# Patient Record
Sex: Female | Born: 1935 | Race: White | Hispanic: No | Smoking: Never smoker
Health system: Southern US, Community
[De-identification: ages and names within clinical notes are randomized; demographics above are authoritative.]

## PROBLEM LIST (undated history)

## (undated) DIAGNOSIS — M1990 Unspecified osteoarthritis, unspecified site: Secondary | ICD-10-CM

## (undated) DIAGNOSIS — I10 Essential (primary) hypertension: Secondary | ICD-10-CM

## (undated) DIAGNOSIS — IMO0001 Reserved for inherently not codable concepts without codable children: Secondary | ICD-10-CM

## (undated) DIAGNOSIS — K5792 Diverticulitis of intestine, part unspecified, without perforation or abscess without bleeding: Secondary | ICD-10-CM

## (undated) DIAGNOSIS — J189 Pneumonia, unspecified organism: Secondary | ICD-10-CM

## (undated) DIAGNOSIS — I341 Nonrheumatic mitral (valve) prolapse: Secondary | ICD-10-CM

## (undated) DIAGNOSIS — R42 Dizziness and giddiness: Secondary | ICD-10-CM

## (undated) DIAGNOSIS — M810 Age-related osteoporosis without current pathological fracture: Secondary | ICD-10-CM

## (undated) DIAGNOSIS — R000 Tachycardia, unspecified: Secondary | ICD-10-CM

## (undated) DIAGNOSIS — R51 Headache: Secondary | ICD-10-CM

## (undated) DIAGNOSIS — I4891 Unspecified atrial fibrillation: Secondary | ICD-10-CM

## (undated) DIAGNOSIS — Z5189 Encounter for other specified aftercare: Secondary | ICD-10-CM

## (undated) DIAGNOSIS — S62109A Fracture of unspecified carpal bone, unspecified wrist, initial encounter for closed fracture: Secondary | ICD-10-CM

## (undated) DIAGNOSIS — Z85048 Personal history of other malignant neoplasm of rectum, rectosigmoid junction, and anus: Secondary | ICD-10-CM

## (undated) DIAGNOSIS — S72001A Fracture of unspecified part of neck of right femur, initial encounter for closed fracture: Secondary | ICD-10-CM

## (undated) DIAGNOSIS — R55 Syncope and collapse: Secondary | ICD-10-CM

## (undated) DIAGNOSIS — F419 Anxiety disorder, unspecified: Secondary | ICD-10-CM

## (undated) HISTORY — DX: Diverticulitis of intestine, part unspecified, without perforation or abscess without bleeding: K57.92

## (undated) HISTORY — DX: Personal history of other malignant neoplasm of rectum, rectosigmoid junction, and anus: Z85.048

## (undated) HISTORY — DX: Age-related osteoporosis without current pathological fracture: M81.0

## (undated) HISTORY — PX: COLECTOMY: SHX59

## (undated) HISTORY — DX: Unspecified atrial fibrillation: I48.91

## (undated) HISTORY — PX: COLONOSCOPY: SHX174

## (undated) HISTORY — PX: COLON SURGERY: SHX602

## (undated) HISTORY — PX: TOTAL HIP ARTHROPLASTY: SHX124

## (undated) HISTORY — DX: Nonrheumatic mitral (valve) prolapse: I34.1

## (undated) HISTORY — PX: BREAST CYST EXCISION: SHX579

## (undated) HISTORY — DX: Essential (primary) hypertension: I10

## (undated) HISTORY — PX: CATARACT EXTRACTION W/ INTRAOCULAR LENS  IMPLANT, BILATERAL: SHX1307

## (undated) HISTORY — DX: Fracture of unspecified part of neck of right femur, initial encounter for closed fracture: S72.001A

## (undated) HISTORY — PX: INGUINAL HERNIA REPAIR: SUR1180

## (undated) HISTORY — DX: Fracture of unspecified carpal bone, unspecified wrist, initial encounter for closed fracture: S62.109A

---

## 2005-03-20 ENCOUNTER — Ambulatory Visit (HOSPITAL_COMMUNITY): Admission: RE | Admit: 2005-03-20 | Discharge: 2005-03-20 | Payer: Self-pay | Admitting: Gastroenterology

## 2005-03-20 ENCOUNTER — Encounter (INDEPENDENT_AMBULATORY_CARE_PROVIDER_SITE_OTHER): Payer: Self-pay | Admitting: Specialist

## 2005-04-14 ENCOUNTER — Encounter: Admission: RE | Admit: 2005-04-14 | Discharge: 2005-04-14 | Payer: Self-pay | Admitting: Family Medicine

## 2005-05-06 ENCOUNTER — Ambulatory Visit (HOSPITAL_COMMUNITY): Admission: RE | Admit: 2005-05-06 | Discharge: 2005-05-06 | Payer: Self-pay | Admitting: Gastroenterology

## 2005-05-06 ENCOUNTER — Encounter (INDEPENDENT_AMBULATORY_CARE_PROVIDER_SITE_OTHER): Payer: Self-pay | Admitting: *Deleted

## 2005-08-14 ENCOUNTER — Ambulatory Visit (HOSPITAL_COMMUNITY): Admission: RE | Admit: 2005-08-14 | Discharge: 2005-08-14 | Payer: Self-pay | Admitting: Gastroenterology

## 2005-08-15 ENCOUNTER — Encounter (INDEPENDENT_AMBULATORY_CARE_PROVIDER_SITE_OTHER): Payer: Self-pay | Admitting: Specialist

## 2005-08-15 ENCOUNTER — Inpatient Hospital Stay (HOSPITAL_COMMUNITY): Admission: RE | Admit: 2005-08-15 | Discharge: 2005-08-26 | Payer: Self-pay | Admitting: General Surgery

## 2005-08-20 ENCOUNTER — Encounter: Payer: Self-pay | Admitting: Cardiology

## 2005-08-20 HISTORY — PX: TRANSTHORACIC ECHOCARDIOGRAM: SHX275

## 2006-05-26 ENCOUNTER — Encounter: Admission: RE | Admit: 2006-05-26 | Discharge: 2006-05-26 | Payer: Self-pay | Admitting: Obstetrics and Gynecology

## 2007-07-19 ENCOUNTER — Encounter: Admission: RE | Admit: 2007-07-19 | Discharge: 2007-07-19 | Payer: Self-pay | Admitting: Cardiology

## 2007-12-13 ENCOUNTER — Inpatient Hospital Stay (HOSPITAL_COMMUNITY): Admission: EM | Admit: 2007-12-13 | Discharge: 2007-12-25 | Payer: Self-pay | Admitting: Emergency Medicine

## 2007-12-13 ENCOUNTER — Ambulatory Visit: Payer: Self-pay | Admitting: Internal Medicine

## 2007-12-13 ENCOUNTER — Ambulatory Visit: Payer: Self-pay | Admitting: Pulmonary Disease

## 2008-02-11 DIAGNOSIS — S72001A Fracture of unspecified part of neck of right femur, initial encounter for closed fracture: Secondary | ICD-10-CM

## 2008-02-11 HISTORY — DX: Fracture of unspecified part of neck of right femur, initial encounter for closed fracture: S72.001A

## 2008-08-11 ENCOUNTER — Encounter: Admission: RE | Admit: 2008-08-11 | Discharge: 2008-08-11 | Payer: Self-pay | Admitting: Obstetrics and Gynecology

## 2009-10-31 ENCOUNTER — Encounter: Admission: RE | Admit: 2009-10-31 | Discharge: 2009-10-31 | Payer: Self-pay | Admitting: Obstetrics and Gynecology

## 2009-11-02 ENCOUNTER — Ambulatory Visit: Payer: Self-pay | Admitting: Cardiology

## 2010-03-03 ENCOUNTER — Encounter: Payer: Self-pay | Admitting: Family Medicine

## 2010-04-19 ENCOUNTER — Ambulatory Visit (INDEPENDENT_AMBULATORY_CARE_PROVIDER_SITE_OTHER): Payer: Federal, State, Local not specified - PPO | Admitting: Cardiology

## 2010-04-19 DIAGNOSIS — I10 Essential (primary) hypertension: Secondary | ICD-10-CM

## 2010-04-19 DIAGNOSIS — I4891 Unspecified atrial fibrillation: Secondary | ICD-10-CM

## 2010-05-03 ENCOUNTER — Other Ambulatory Visit: Payer: Self-pay | Admitting: *Deleted

## 2010-05-03 DIAGNOSIS — I10 Essential (primary) hypertension: Secondary | ICD-10-CM

## 2010-05-03 MED ORDER — VERAPAMIL HCL ER 180 MG PO TBCR: 180.0000 mg | EXTENDED_RELEASE_TABLET | Freq: Every day | ORAL | Status: DC

## 2010-05-03 NOTE — Telephone Encounter (Signed)
REFILL PER FAX FROM PHARMACY  

## 2010-06-06 ENCOUNTER — Other Ambulatory Visit: Payer: Self-pay | Admitting: Nurse Practitioner

## 2010-06-06 DIAGNOSIS — I10 Essential (primary) hypertension: Secondary | ICD-10-CM

## 2010-06-25 NOTE — Consult Note (Signed)
[name], [name]                ACCOUNT NO.:  [id number]   MEDICAL RECORD NO.:  [id number]          PATIENT TYPE:  INP   LOCATION:  0104                         FACILITY:  [facility]   PHYSICIAN:  Bevelyn Buckles. Bensimhon, MDDATE OF BIRTH:  1914-04-04   DATE OF CONSULTATION:  DATE OF DISCHARGE:                                 CONSULTATION   CARDIOLOGIST:  Dr. Roger Shelter   CONSULTING PHYSICIAN:  Dr. Homero Fellers Aluisio   REASON FOR CONSULTATION:  Preoperative evaluation in the setting of  right hip fracture.   HISTORY OF PRESENT ILLNESS:  [name] is a delightful 77 year old  woman without history of known coronary artery disease.  She reports a  negative stress test approximately 3 years ago.  Her past medical  history is notable for hypertension, history of rectal cancer status  post resection in July 2007 which was complicated by postop atrial  fibrillation.  She also has a history of osteoarthritis and has had a  left hip replacement in [place] in 2003 which was tolerated without  complication.   At baseline, she is very functional.  She does work around the house and  was opened down steps without any dyspnea or chest pain.  She has not  had any syncope.  Today, she was dry with blowing her leaves when the  cord got tangled around her foot and she fell and fractured her right  hip.  She denies any associated chest pain, palpitations or loss of  consciousness.   She was seen by the orthopedic service and is pending right hip  hemiarthroplasty tomorrow.  We are asked to consult for preop risk  stratification.   REVIEW OF SYSTEMS:  All systems negative except as mentioned above.   PAST MEDICAL HISTORY:  1. Hypertension.  2. History of rectal cancer status post resection in July 2007.  3. History of postop atrial fibrillation in July 2007.  Echocardiogram      showed an EF of 60% with mild LVH.  No valvular abnormalities.  4. History of the osteoarthritis status post left  hip replacement      2003.  5. Osteoporosis.   CURRENT MEDICATIONS:  1 . Verapamil 180 a day.  1. Micardis 40 a day.  2. Simvastatin 20 a day.  3. Toprol XL 50 a day.  4. Fosamax 35 a day.  5. Iron.  6. Multivitamin.   ALLERGIES:  SULFA WHICH CAUSES HIVES.   SOCIAL HISTORY:  She is widowed and retired.  She lives with her  granddaughter denies any tobacco or alcohol.   FAMILY HISTORY:  Noncontributory.   PHYSICAL EXAMINATION:  GENERAL:  She is lying in bed writhing in pain  due to her right hip.  VITAL SIGNS:  Blood pressure is 149/70, heart rate 90, sating 94% on  room air.  HEENT:  Normal.  NECK:  Supple.  There is no JVD.  No obvious carotid bruits, though it  is a bit hard to tell given her pain.  Carotid upstrokes are full.  There is no lymphadenopathy or thyromegaly.  CARDIAC:  PMI is nondisplaced.  She is regular rate and rhythm.  No  obvious murmurs, rubs or gallops.  LUNGS:  Clear.  ABDOMEN:  Soft, nontender, nondistended.  No hepatosplenomegaly, no  bruits.  No masses.  Good bowel sounds.  EXTREMITIES:  Warm with no cyanosis, clubbing or edema.  Good distal  pulses.  NEUROLOGICAL:  She is alert and oriented x3.  She is in pain.  Moves all  four extremities except her right leg without difficulty.  Affect is in  pain once again.   White count 11.3, hemoglobin 13.3, platelet 208,000.  Sodium 141,  potassium 3.6, BUN 24, creatinine 0.65.  EKG shows normal sinus rhythm  with LVH.  No ST-T wave abnormalities.   ASSESSMENT:  1. Right hip fracture due to fall.  2. Hypertension.  3. History of postop atrial fibrillation in July 2007.   PLAN/DISCUSSION:  I have given her good functional capacity.  [name] is at low risk for perioperative cardiac ischemic events.  I do  not think there is any need for further cardiac testing at this point.  I would continue Verapamil and Toprol.  Also, watch closely for postop  atrial fibrillation given her previous history.   We will follow with  you.  We appreciate the consult.      Bevelyn Buckles. Bensimhon, MD  Electronically Signed     DRB/MEDQ  D:  12/13/2007  T:  12/14/2007  Job:  [id number]

## 2010-06-25 NOTE — Op Note (Signed)
[name], [name]                ACCOUNT NO.:  [id number]   MEDICAL RECORD NO.:  [id number]          PATIENT TYPE:  INP   LOCATION:  1604                         [facility]ACILITY:  [facility]   PHYSICIAN:  Ollen Gross, M.D.    DATE OF BIRTH:  [birth year]   DATE OF PROCEDURE:  12/14/2007  DATE OF DISCHARGE:                               OPERATIVE REPORT   PREOPERATIVE DIAGNOSIS:  Right femoral neck basilar fracture.   POSTOPERATIVE DIAGNOSIS:  Right femoral neck basilar fracture.   PROCEDURE:  Right hip hemiarthroplasty.   SURGEON:  Ollen Gross, M.D.   ASSISTANT:  Alexzandrew L. Perkins, P.A.C.   ANESTHESIA:  General.   ESTIMATED BLOOD LOSS:  200.   DRAINS:  Hemovac times one.   COMPLICATIONS:  None.   CONDITION:  Stable to recovery.   CLINICAL NOTE:  [name] is a 77 year old female who fell yesterday  sustaining a fracture at the base of her femoral neck right adjacent to  the intertrochanteric region.  This is a comminuted fracture of the neck  and not amenable to internal fixation.  She presents now for  hemiarthroplasty using the calcar replacement prosthesis due to the  level of fracture just above the lesser trochanter.   PROCEDURE IN DETAIL:  After successful administration of general  anesthetic, the patient was placed in the left lateral decubitus  position with the right side up and held with the hip positioner.  The  right lower extremity was isolated from the perineum with plastic drapes  and prepped and draped in the usual sterile fashion.  A short  posterolateral incision was made with 10 blade through subcutaneous  tissue to the level of fascia lata which was incised in line with the  skin incision.  The sciatic nerve was palpated and protected and short  rotators isolated off the femur.  Capsulotomy was performed and the  fracture identified right at the base of the neck.  The femoral head is  removed.  This was a very comminuted fracture.  I freshened up  the  resection level with a rongeur and saw.  We then used the canal finder  and lateralizer to get out lateral into the trochanter.  The canal was  then thoroughly irrigated with saline solution.  We broached up to a  size 4.  A size 4 trial with the 45 mm neck for the Ultima calcar  replacement system was placed in about 20 degrees of anteversion.  We  trialed this with a 48 mm bipolar with 28, +0 head.  The hip reduced  with excellent stability throughout full range of motion.  She had full  extension, full external rotation with 70 degrees flexion, 40 degrees  adduction, 90 degrees internal rotation and 90 degrees of flexion with  90 degrees of internal rotation.  By placing her right leg on top of the  left it felt as though the leg lengths were equal.  The hip was  dislocated and the trials removed.  We trialed the cement restrictor and  it was a size 5.  It is impacted at the  appropriate depth in the femoral  canal.  The canal was thoroughly irrigated with pulsatile lavage and the  cement mixed.  Once ready for implantation it was injected into the  femoral canal and hand pressurized.  The size 4 Ultima calcar  replacement stem with the 45 neck extension was then placed in 20  degrees of anteversion and impacted.  All extruded cement was removed.  When the cement was fully hardened then the permanent 28, +0 head and 48  bipolar were placed.  The hip was reduced at the same stability  parameters.  The wound was copiously irrigated with saline solution and  the capsule and external rotators reattached to the femur through drill  holes.  The fascia lata was closed over a Hemovac drain with interrupted  #1 Vicryl.  The subcu closed with #1 then 2-0 Vicryl and subcuticular  running 4-0 Monocryl.  The drain was hooked to suction.  Incision  cleaned and dried and Steri-Strips and bulky sterile dressing applied.  She was then placed in a knee immobilizer, awakened, and transported to   recovery in stable condition.      Ollen Gross, M.D.  Electronically Signed     FA/MEDQ  D:  12/14/2007  T:  12/15/2007  Job:  [id number]

## 2010-06-28 NOTE — Consult Note (Signed)
NAME:  [name], [name]                ACCOUNT NO.:  [id number]   MEDICAL RECORD NO.:  [id number]          PATIENT TYPE:  AMB   LOCATION:  ENDO                         FACILITY:  [facility]   PHYSICIAN:  Colleen Can. Deborah Chalk, M.D.DATE OF BIRTH:  22-May-1914   DATE OF CONSULTATION:  DATE OF DISCHARGE:  08/14/2005                                   CONSULTATION   HISTORY:  Thank you very much for asking me to see Mrs. Counce.  She is a  very pleasant 77 year old female who had a villous tumor of the rectum and  had rectosigmoid resection with low colonic rectal anastomosis and  proctosigmoidoscopy on August 15, 2005.  She had minimal blood loss at that  point in time.  She had done well with her postoperative recovery but  apparently has not been on her usual dose of verapamil or Micardis.  Today,  she developed atrial fibrillation with a rapid ventricular response.  She is  relatively asymptomatic from that except feeling somewhat weak and tired.  She is placed on nasal oxygen.  We were called to evaluate her.  A phone  call was made to place her on Cardizem drip and at the time she is seen, she  is reasonably stable but she had a persistence of a rapid tachycardia by  telemetry.   She has a history of atrial fibrillation when she had a right inguinal  hernia repair in the late 80s.  She has also had a history of mitral valve  prolapse and hypertension.  She had hip replacement within the last 5-7  years and had no arrhythmia at that point and time.   CURRENT MEDICATIONS:  Her current medications at this time of admission  include verapamil 180 mg a day, Micardis 40 mg a day, Zocor 20 mg every  other day, Fosamax 35 mg a week, Feosol 65 mg daily and multivitamin.   ALLERGIES:  SHE IS ALLERGIC TO SULFA DRUGS WHICH CAUSE A RASH.   SOCIAL HISTORY:  She has moved here to be with her daughter.  She previously  lived in [place].  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  She has been  widowed since  1956 and raised 3 daughters.  She does not have any tobacco or  alcohol use.   REVIEW OF SYSTEMS:  Recently negative as she describes.  She is in general  in good health.  She has had cardiology followup in [place] but none here  since moving to [place].   FAMILY HISTORY:  Noncontributory.   PHYSICAL EXAMINATION:  General: She is a pleasant elderly white female in  currently no acute distress.  Vital signs: Blood pressure 140/70, heart rate is in the 130-140 range on a  Cardizem drip.  HEENT: Remarkable only for some paleness.  She is in no acute distress.  Lungs: Clear.  Heart: Shows tachycardia without murmur or gallop.  Abdomen: Reasonably soft.  Extremities: Without edema.   EKG shows atrial fibrillation with a rapid ventricular response and  nonspecific ST and T wave changes.   OVERALL IMPRESSION:  1.  Atrial fibrillation  with rapid ventricular response.  2.  History of mitral valve prolapse.  3.  History of hypertension.   PLAN:  Will place her on Lovenox.  Will try to slow her ventricular response  with the use of IV Cardizem, digoxin and beta blockers.  Will check a 2D  echocardiogram.  Will also rule out myocardial infarction.  We will follow  with you.  Thanks for allowing [place] to participate.      Colleen Can. Deborah Chalk, M.D.  Electronically Signed     SNT/MEDQ  D:  08/19/2005  T:  08/20/2005  Job:  [id number]

## 2010-06-28 NOTE — H&P (Signed)
NAMETARRY, BLAYNEY                ACCOUNT NO.:  [id number]   MEDICAL RECORD NO.:  [id number]          PATIENT TYPE:  INP   LOCATION:  2550                         FACILITY:  [facility]   PHYSICIAN:  Leonie Man, M.D.   DATE OF BIRTH:  Jan 18, 1915   DATE OF ADMISSION:  08/15/2005  DATE OF DISCHARGE:                                HISTORY & PHYSICAL   PROBLEM:  Villous tumor off the rectum.   HISTORY:  [name] is a generally healthy 77 year old female  presenting originally with rectal bleeding.  She was seen by Dr. 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  This was incompletely excised colonoscopically and the patient is now being  referred for a low anterior resection off this area.  All of the biopsies  off this area show this to be a tubulovillous adenoma with no evidence of  atypia in any of the biopsied specimen.   PAST MEDICAL HISTORY:  The patient has a history of a mitral valve prolapse  and history of hypertension.  She has had a left-sided hip replacement and a  right inguinal hernia repair in the remote past.  She has also had a device  placed within her vagina for bladder suspension.   MEDICATIONS:  1.  Verapamil 180 mg daily.  2.  Micardis 40 mg daily.  3.  Zocor 20 mg one every 2 days.  4.  Fosamax 35 mg once weekly.  5.  Feosol 65 mg once weekly.  6.  She takes a multivitamin.   ALLERGIES:  THE PATIENT IS ALLERGIC TO SULFA DRUGS WHICH CAUSE A RASH.   SOCIAL HISTORY:  Widowed white female.  No tobacco use, alcohol use, or  illicit drug use history.   REVIEW OF SYSTEMS:  Review of systems is negative in detail except as  outlined in the past medical history and present illness.   PHYSICAL EXAMINATION:  GENERAL:  Well-developed, well-nourished, alert  female whose temperature is 98.4, heart rate is 80 and regular, respirations  16 and  unlabored.  Blood pressure 159/99.  The patient's O2 saturation is  96% on room air.  HEENT AND NECK:  Head is normocephalic.  Pupils are round and regular.  She  seems to have had cataract surgery in the past.  There is no scleral  icterus.  Nasopharynx and oropharynx are clear.  The neck is supple.  There  is no thyromegaly or adenopathy.  The trachea moves easily.  CHEST:  Clear to auscultation bilaterally.  No adventitious sounds.  Heart  regular rate and rhythm without murmurs or gallop rhythms heard.  I do not  hear a mitral click on this examination.  ABDOMEN:  Soft, nontender, not distended.  Bowel sounds are normoactive.  There is a well-healed right lower quadrant groin scar from a previous  inguinal hernia repair.  There are no abdominal or femoral bruits.  EXTREMITIES:  Range of motion is mildly limited due to some mild  osteoarthritis.  There is a well-healed lateral left thigh incision from her  previous hip replacement.  There is no calf tenderness, no axial edema.  NEUROLOGIC SCREENING:  Examination shows the patient to be alert and  oriented x3.  She moves all four extremities well without any evidence of  lateralizing signs.   LABORATORY FINDINGS:  The patient has a white blood cell count of 7.5 with a  hemoglobin of 13.6, platelet count is 244.  Sodium 144, potassium 4.1,  chloride 105, CO2 29, glucose is 84, BUN is 19, creatinine 0.6, calcium 9.2.  Her cardiogram shows normal sinus rhythm and is a normal EKG.  Chest x-ray  shows no evidence of active disease.   ASSESSMENT:  1.  Bleeding villous tumor of the rectum status post biopsy showing a benign      tubulovillous adenoma.  2.  Hypertension by history.  3.  Mitral valve prolapse by history status post left total hip replacement.   PLAN:  The plan is for low anterior resection of the rectosigmoid with local  rectal anastomosis.  The risks and potential benefits of surgery have been  fully discussed with the  patient and with her family and they understand and  give their consent for surgery.      Leonie Man, M.D.  Electronically Signed     PB/MEDQ  D:  08/15/2005  T:  08/15/2005  Job:  [id number]

## 2010-06-28 NOTE — Op Note (Signed)
[name]                ACCOUNT NO.:  [id number]   MEDICAL RECORD NO.:  [id number]          PATIENT TYPE:  INP   LOCATION:  2304                         FACILITY:  [facility]   PHYSICIAN:  Leonie Man, M.D.   DATE OF BIRTH:  11/12/1914   DATE OF PROCEDURE:  08/15/2005  DATE OF DISCHARGE:                                 OPERATIVE REPORT   PREOPERATIVE DIAGNOSIS:  Villous tumors of the rectum.   POSTOPERATIVE DIAGNOSIS:  Villous tumors of the rectum.   PROCEDURE:  1.  Rectosigmoid resection with low colorectal anastomosis.  2.  Proctosigmoidoscopy.   SURGEON:  Leonie Man, M.D.   ASSISTANT:  [name], M.D.   ANESTHESIA:  General.   SPECIMENS TO THE LAB:  Rectum and sigmoid colon.   ESTIMATED BLOOD LOSS:  50 mL.   DISPOSITION:  There were no apparent complications during this operation;  and the patient returned to the PACU in excellent condition.   NOTE:  [name] is an 77 year old patient presenting originally with  rectal bleeding.  On colonoscopy she was noted to have villous tumors at 10  cm and at 15 cm from the anal verge.  These were both biopsied and showed  villous tumors without any evidence of atypia or dysplasia.  The patient  comes to the operating room, now, after the risks and potential benefits  have been discussed with her and with her family.  She understands the risks  and possibility of anastomotic leak, infection, wound disruption pulmonary  embolus; but not limited to these complications.   DESCRIPTION OF PROCEDURE:  Following the induction of satisfactory general  anesthesia, the patient is positioned in the lithotomy position; and she  underwent proctosigmoidoscopy.  The most distal of the tumors, at 10 cm,  could be easily seen.  It was very friable and bleeding.  On evaluation, the  patient's vaginal pessary was also removed so as to allow ease of her  operation.  The findings at the time of proctosigmoidoscopy show  that the  patient did have large internal hemorrhoids.   Attention was then turned to the abdomen and following prepping and draping  of the abdomen and perineum, a midline incision was made from the pubis up  to the umbilicus, deepened through the skin and subcutaneous tissues,  through the linea alba.  The abdomen was opened.  Exploration of the abdomen  was then carried out.  There were no additional findings other than those  noted at the time of colonoscopy.  The patient had undergone tattooing of  the tumors by Dr. Jeani Hawking, and the area of [place] ink could be seen in  the mid-and-distal rectum..   Dissection was started along the region of the sacral promontory carrying  the dissection down over the over sacral promontory into the true pelvis.  Dissection was carried down behind the uterus and bladder.  The uterus was  held with a suture so as to hold it up out of the area of dissection.  The  entire mesorectum was mobilized from the pelvic hollow with diligent  protection of the right and left ureters during the course of the  dissection.  The sigmoid colon was then transected at approximately the mid  sigmoid; and with the use of the LigaSure, the mesentery was taken down into  the pelvis.  The tumor could both be felt, within, by palpating the rectum.  A contour stapler was passed down below the area of the tumors; and this was  fired.  The proximal colon was then intubated with the EEA stapling device  and an end-to-end stapled anastomosis was carried out with the EEA size 29  stapling instrument.  The resulting anastomosis was noted to be intact.  This was tested by pouring saline into the pelvis and pumping air up through  the rectum into the colon.  There were no bubbles noted to indicate leakage.  The EEA stapling device showed 2 complete rings of colon and rectum having  been removed.  The pelvis was then thoroughly irrigated with multiple  aliquots of saline.    Sponge, instrument and sharp counts were verified; and the wound was closed  in layers as follows: The midline was closed with  a running double-stranded  PDS-II suture.  The subcutaneous tissues were irrigated; and the skin was  closed with stainless steel staples.  Sterile dressings were then placed on  the wound.  The anesthetic reversed; and the patient removed from the  operating room, to the recovery room in stable condition.  She tolerated the  procedure well.      Leonie Man, M.D.  Electronically Signed     PB/MEDQ  D:  08/15/2005  T:  08/15/2005  Job:  [id number]   cc:   Jordan Hawks. Elnoria Howard, MD  Fax: [phone]

## 2010-06-28 NOTE — Discharge Summary (Signed)
NAMELOANN, CHAHAL                ACCOUNT NO.:  [id number]   MEDICAL RECORD NO.:  [id number]          PATIENT TYPE:  INP   LOCATION:  3734                         FACILITY:  [facility]   PHYSICIAN:  Leonie Man, M.D.   DATE OF BIRTH:  03-18-1914   DATE OF ADMISSION:  08/15/2005  DATE OF DISCHARGE:  08/26/2005                                 DISCHARGE SUMMARY   ADMISSION DIAGNOSIS:  Tumor of the rectum.   PROCEDURES IN HOSPITAL:  Rectosigmoid resection with low anterior local  rectal anastomosis.   POSTOPERATIVE COURSE:  Complicated by paroxysmal atrial fibrillation with  rapid ventricular response.   CONSULTATIONS:  With cardiology services, treatment with Cardizem and  Lopressor with significant improvement of her PAF and conversion to sinus  rhythm.   CONDITION ON DISCHARGE:  Improved.   FOLLOWUP:  In 2 weeks with me.   DISCHARGE MEDICATIONS:  Vicodin, Verapamil, Lopressor, Lanoxin, Micardis,  Zocor and aspirin.   HOSPITAL COURSE:  The patient is an 77 year old female with bilious tumor of  the rectum who was referred after biopsy showing a tubovillous adenoma.  She  was admitted to hospital and underwent rectosigmoid resection with  colorectal anastomosis.  Her postoperative course had gone well, but she  developed paroxysmal atrial tachycardia, not having been on her usual dose  of Verapamil or Micardis.  She was seen in consultation by Dr. Delfin Edis,  put her on Cardizem and subsequently back on Lopressor with good response  for her atrial fibrillation.  At the time of discharge, she is doing well  and tolerating her usual diet without difficulty.  She is being discharged  to be followed up in the office in 2 weeks.   DISCHARGE MEDICATIONS:  As listed above.      Leonie Man, M.D.  Electronically Signed     PB/MEDQ  D:  12/01/2005  T:  12/01/2005  Job:  [id number]

## 2010-06-28 NOTE — Discharge Summary (Signed)
[name], [name]                ACCOUNT NO.:  [id number]   MEDICAL RECORD NO.:  [id number]          PATIENT TYPE:  INP   LOCATION:  1615                         FACILITY:  [facility]   PHYSICIAN:  Ollen Gross, M.D.    DATE OF BIRTH:  January 19, 1915   DATE OF ADMISSION:  12/13/2007  DATE OF DISCHARGE:  12/25/2007                               DISCHARGE SUMMARY   ADMITTING DIAGNOSES:  1. Right femoral neck fracture.  2. Hypertension.  3. History of rectal cancer.  4. Past history of atrial fibrillation July 2007.  5. Mitral valve prolapse.  6. Osteoporosis.   DISCHARGE DIAGNOSES:  1. Right femoral neck basilar fracture status post right hip      hemiarthroplasty.  2. Postop acute blood loss anemia.  3. Status post transfusion.  4. Acute respiratory failure.  5. Bilateral pulmonary infiltrates.  6. Major delirium postop.  7. Hypoxia postop secondary to respiratory failure.  8. Postop hypotension.  9. Intermittent atrial fibrillation.  10.Hypertension.  11.History of rectal cancer.  12.Past history of atrial fibrillation July 2007.  13.Mitral valve prolapse.  14.Osteoporosis.   PROCEDURE:  December 14, 2007 right hip hemiarthroplasty.  Surgeon, Dr.  Lequita Halt.  Assistant, Avel Peace, PA-C.  Anesthesia, general.   CONSULTANTS:  Cardiology, Dr. Deborah Chalk.  Also critical care medicine.   BRIEF HISTORY:  [name] is a 77 year old female who fell on the date  of admission sustaining a fracture of the base of the femoral neck,  comminuted, felt that she would need a hemiarthroplasty.  Risks and  benefits discussed.  The patient subsequently admitted to the hospital.   LABORATORY DATA:  Blood gas taken on December 15, 2007 showed a pH high  at 7.406, pCO2 of 46.8, pO2 elevated at 159, bicarb 28.8, total CO2  27.1.  Followup blood gas December 16, 2007 pH count of 7.398, pCO2 of  48.2, pO2 low at 70.1, bicarb 28.1, total CO2 of 26.5.  CBC on admission  showed hemoglobin 13.3,  hematocrit 39.7, white cell count 1.3, platelets  208,000.  Serial CBCs were followed.  Hemoglobin dropped down to 9.2  then to 7.2, given 2 units blood.  Hemoglobin back up to 9.9.  Last H  and H 10.4 and 31.0.  PT/PTT preop 12.8 and 29 respectively.  INR 1.0.  Serial pro time followed per Coumadin protocol.  Last PT/INR 21.1 and  2.0.  Chem panel on admission low total protein 5.1, albumin 2.3.  Remaining Chem panel also showed a BUN elevated 24, glucose elevated at  134.  Remaining Chem panel within normal limits.  Serial BMET followed.  Potassium dropped 3.6 to 3.2 and back to 3.5.  Remaining electrolytes  remained within normal limits.  BUN came down to normal level 20.  Cardiac enzymes, 3 sets, first set taken on December 14, 2007 CK elevated  to 272, CK-MB elevated at 50.1 index normal at 2.2.  Troponin normal  0.02.  Second set on December 15, 2007 CK elevated to 299, CK-MB 7.1  elevation, index normal at 2.4.  Troponin normal at 0.03.  Third set  taken  on December 15, 2007 CK elevated 318, CK-MB normal elevated to 6.9,  relative index normal at 2.2, troponin elevated 0.09.  B-type  natriuretic peptide taken on December 15, 2007 elevated to 234.  B-  natriuretic peptide December 16, 2007 elevated 340 and again on December 17, 2007 improving down to 243.  Preop UA small leukocyte esterase to  bacteria, hyaline casts 3 to 6 white cells, 0 to 2 red cells.  Followup  UA on December 23, 2007 positive for urobilinogen, otherwise negative.  Blood group type O positive.  Blood culture taken x2 on December 16, 2007  no growth.  Urine culture on December 15, 2007 negative.  Urine culture  December 23, 2007 negative.  No growth.   EKG December 13, 2007 normal sinus rhythm, minimal voltage criteria for  LVH.  Confirmed EKG December 15, 2007 normal sinus rhythm, possible  anterior infarct age undetermined since last tracing, rate is faster per  Dr. Orvan Falconer.   X-RAYS:  Chest x-ray December 14, 2007,  new small left pleural effusion  with probable pulmonary venous congestion, bilateral air space disease  most likely atelectasis.  CT angio December 15, 2007 negative for PE.  Portable chest December 16, 2007 with interstitial pulmonary edema  bilateral atelectasis, bibasilar atelectasis, and bilateral small to  moderate pleural effusions.  Portable chest December 17, 2007 stable  chest x-ray with persistent diffuse interstitial and air space disease.   HOSPITAL COURSE:  The patient admitted [facility] on December 13, 2007 for a femoral neck fracture from fall.  Dr. Deborah Chalk cardiologist  was notified.  The patient placed on bedrest, given pain meds.  Seen  December 14, 2007 preop for surgery.  She had been seen by Dr. Deborah Chalk  November 3 and felt to be low risk for perioperative cardiac ischemic  issues.  Was taken to the OR later that day and underwent the procedure  without complication.  She was later transferred to the recovery room  but then had to go to step-down ICU for a difficulty waking up after  surgery and had dropping O2 sats showing hypoxia.  The patient was  removed from face mask, her O2 sats were dropping and felt due to some  respiratory failure.  She was transferred to ICU and critical care  medicine was consulted.  She was found have pulmonary infiltrates.  CT  was ordered but was negative for PE.  She was placed on monitor bed.  Unfortunately, she developed some delirium with the hypoxia and  transfer.  She was given pain medications.  She was initially placed on  PCA but unable to do that, so she was given p.o. and IV analgesics for  pain control.  She was stable from a cardiac standpoint but critical  care medicine followed her very closely for her hypoxemia.  Chest x-rays  were ordered and followed for a couple of days.  She was given Haldol  for the agitation and confusion.  Blood gases were taken on November 4  and November 5 as above.  Infiltrates were  followed.  She is placed on  IV vancomycin and blood culture taken which proved to be negative.  She  had cardiac enzymes ordered also, which did not show any acute cardiac  issues.  Placed on nebulizers and flutter valve.  Also given Lasix for  the pulmonary infiltrates and edema.  She was monitored very closely for  several days in the ICU step-down.  She started to  improve.  She did  receive blood postop because her hemoglobin had gotten down low.  It  drifted down to 7.2 on November 6, two days after surgery.  She  tolerated the blood well.  She was slowly progressing and her vitals  started to improve.  She did develop some severe hypotension during this  time during the hypoxia and respiratory failure.  The blood pressure  stabilized.  She improved with low dose Haldol and by day 3, her  hemoglobin had come back up following the blood.  Encouraged pulmonary  toilet, but it was difficult with the initial delirium postop.  Once she  was able to participate, the patient started to assist with up out of  bed.  Over the weekend, she was followed.  Hemoglobin stable.  Dressing  change started on day 2.  Incision was healing well, no signs of  infection.  Hemodynamically she was doing pretty well by postop day #4  and #5.  She continued to receive Lasix as needed.  It is felt once she  stabilized and she was able to be transferred to the floor.  She was  then moved to the telemetry unit.  She was monitored closely.  Blood  pressure continued to do well when she was stabilized and the Coumadin  was restarted.  It was held initially when she was in the ICU but that  was restarted.  We did encouraged her to ambulate with therapy.  She had  initially slow progress and felt that she would require some type  skilled nursing facility.  However, when social worker talked with  family, they wanted to take her home.  She remained in the hospital.  Encouraged decubitus precautions.  INR was slowly  trending upward.  By  day 7, it was up to 1.7.  Incision continued to heal well.  By day 8,  cardiology signed off since she remained stable for several days.  She  was only walking short distances.  By day 9 we discontinued the Foley  and rechecked the UA.  The UA was negative.  Discontinued telemetry.  By  day 10, however, she did really well walking about 125 feet.  She was  much improved and it was felt that she would be okay to go home and  family wanted to take her home.  She received therapy for another day  and by December 25, 2007 the patient was doing well, tolerating her  meds, and was discharged home with family at that time.   DISCHARGE/PLAN:  1. Discharged home on December 25, 2007.  2. Discharge diagnoses please see above.  3. Discharge meds:  Vicodin, Robaxin, Coumadin.  4. Follow up in 2 weeks.  5. Activity:  Weightbearing as tolerated.  Hip precautions.  6. Disposition:  Home with family.   CONDITION ON DISCHARGE:  Improving.      Alexzandrew L. Perkins, P.A.C.      Ollen Gross, M.D.  Electronically Signed    ALP/MEDQ  D:  02/17/2008  T:  02/17/2008  Job:  [id number]   cc:   Colleen Can. Deborah Chalk, M.D.  Fax: [phone]   Critical Care Medicine

## 2010-09-27 ENCOUNTER — Other Ambulatory Visit: Payer: Self-pay | Admitting: Obstetrics and Gynecology

## 2010-09-27 DIAGNOSIS — Z1231 Encounter for screening mammogram for malignant neoplasm of breast: Secondary | ICD-10-CM

## 2010-10-07 ENCOUNTER — Other Ambulatory Visit: Payer: Self-pay | Admitting: Nurse Practitioner

## 2010-10-07 NOTE — Telephone Encounter (Signed)
Fax received from pharmacy. Refill completed. Jodette Pasqualina Colasurdo RN  

## 2010-10-29 ENCOUNTER — Other Ambulatory Visit: Payer: Self-pay | Admitting: Obstetrics and Gynecology

## 2010-11-04 ENCOUNTER — Ambulatory Visit: Payer: Federal, State, Local not specified - PPO

## 2010-11-04 ENCOUNTER — Ambulatory Visit
Admission: RE | Admit: 2010-11-04 | Discharge: 2010-11-04 | Disposition: A | Discharge status: Home / Self Care | Payer: Federal, State, Local not specified - PPO | Source: Ambulatory Visit | Attending: Obstetrics and Gynecology | Admitting: Obstetrics and Gynecology

## 2010-11-04 DIAGNOSIS — Z1231 Encounter for screening mammogram for malignant neoplasm of breast: Secondary | ICD-10-CM

## 2010-11-06 ENCOUNTER — Encounter: Payer: Self-pay | Admitting: Cardiovascular Disease

## 2010-11-13 LAB — CBC
HCT: 27.9 — ABNORMAL LOW
HCT: 28.8 — ABNORMAL LOW
HCT: 30.3 — ABNORMAL LOW
HCT: 39.7
Hemoglobin: 10.2 — ABNORMAL LOW
Hemoglobin: 13.3
Hemoglobin: 7.2 — CL
Hemoglobin: 9.9 — ABNORMAL LOW
MCHC: 33.6
MCHC: 33.9
MCHC: 34.4
MCV: 97.1
MCV: 97.5
MCV: 99.8
Platelets: 129 — ABNORMAL LOW
Platelets: 363
RBC: 2.12 — ABNORMAL LOW
RBC: 2.67 — ABNORMAL LOW
RBC: 2.75 — ABNORMAL LOW
RBC: 2.98 — ABNORMAL LOW
RBC: 3.12 — ABNORMAL LOW
RBC: 3.18 — ABNORMAL LOW
RDW: 13
RDW: 14.3
WBC: 10.4
WBC: 11.8 — ABNORMAL HIGH
WBC: 14.6 — ABNORMAL HIGH
WBC: 15.6 — ABNORMAL HIGH
WBC: 9.3

## 2010-11-13 LAB — BASIC METABOLIC PANEL
BUN: 20
BUN: 24 — ABNORMAL HIGH
BUN: 28 — ABNORMAL HIGH
CO2: 27
CO2: 30
CO2: 31
CO2: 32
Calcium: 8.2 — ABNORMAL LOW
Calcium: 8.4
Chloride: 102
Chloride: 103
Chloride: 104
Chloride: 105
Creatinine, Ser: 0.41
Creatinine, Ser: 0.48
Creatinine, Ser: 0.65
GFR calc Af Amer: >60
GFR calc Af Amer: >60
GFR calc Af Amer: >60
GFR calc Af Amer: >60
GFR calc non Af Amer: >60
GFR calc non Af Amer: >60
Glucose, Bld: 103 — ABNORMAL HIGH
Glucose, Bld: 137 — ABNORMAL HIGH
Potassium: 3.2 — ABNORMAL LOW
Potassium: 3.5
Potassium: 3.9
Potassium: 4.3
Sodium: 140
Sodium: 141
Sodium: 141
Sodium: 143

## 2010-11-13 LAB — COMPREHENSIVE METABOLIC PANEL
ALT: 11
Alkaline Phosphatase: 51
Alkaline Phosphatase: 52
BUN: 26 — ABNORMAL HIGH
CO2: 28
Calcium: 7.8 — ABNORMAL LOW
Creatinine, Ser: 0.82
GFR calc non Af Amer: >60
Glucose, Bld: 127 — ABNORMAL HIGH
Glucose, Bld: 166 — ABNORMAL HIGH
Potassium: 3.8
Sodium: 142
Total Protein: 5.1 — ABNORMAL LOW

## 2010-11-13 LAB — URINE MICROSCOPIC-ADD ON

## 2010-11-13 LAB — BLOOD GAS, ARTERIAL
Bicarbonate: 28.1 — ABNORMAL HIGH
Drawn by: 103701
Drawn by: 229971
FIO2: 1
O2 Saturation: 99.3
Patient temperature: 102
Patient temperature: 98.5
pH, Arterial: 7.394

## 2010-11-13 LAB — PROTIME-INR
INR: 1.3
INR: 1.4
INR: 1.5
INR: 1.5
INR: 1.6 — ABNORMAL HIGH
INR: 1.7 — ABNORMAL HIGH
INR: 1.8 — ABNORMAL HIGH
INR: 2 — ABNORMAL HIGH
INR: 2.2 — ABNORMAL HIGH
Prothrombin Time: 12.8
Prothrombin Time: 18.7 — ABNORMAL HIGH
Prothrombin Time: 20 — ABNORMAL HIGH
Prothrombin Time: 20.5 — ABNORMAL HIGH
Prothrombin Time: 21.7 — ABNORMAL HIGH
Prothrombin Time: 22.7 — ABNORMAL HIGH
Prothrombin Time: 24.1 — ABNORMAL HIGH

## 2010-11-13 LAB — URINALYSIS, ROUTINE W REFLEX MICROSCOPIC
Bilirubin Urine: NEGATIVE
Bilirubin Urine: NEGATIVE
Hgb urine dipstick: NEGATIVE
Hgb urine dipstick: NEGATIVE
Nitrite: NEGATIVE
Nitrite: NEGATIVE
Protein, ur: NEGATIVE
Protein, ur: NEGATIVE
Specific Gravity, Urine: 1.024
Urobilinogen, UA: 4 — ABNORMAL HIGH
pH: 5

## 2010-11-13 LAB — DIFFERENTIAL
Basophils Absolute: 0.1
Basophils Relative: 0
Eosinophils Relative: 1
Monocytes Relative: 7
Neutro Abs: 12.5 — ABNORMAL HIGH
Neutrophils Relative %: 84 — ABNORMAL HIGH
Neutrophils Relative %: 85 — ABNORMAL HIGH

## 2010-11-13 LAB — CARDIAC PANEL(CRET KIN+CKTOT+MB+TROPI)
CK, MB: 6.9 — ABNORMAL HIGH
Relative Index: 2.2
Relative Index: 2.4
Total CK: 299 — ABNORMAL HIGH
Troponin I: 0.02
Troponin I: 0.03
Troponin I: 0.09 — ABNORMAL HIGH

## 2010-11-13 LAB — CULTURE, BLOOD (ROUTINE X 2)
Culture: NO GROWTH
Culture: NO GROWTH

## 2010-11-13 LAB — URINE CULTURE
Colony Count: NO GROWTH
Culture: NO GROWTH

## 2010-11-13 LAB — CROSSMATCH: Antibody Screen: NEGATIVE

## 2010-11-13 LAB — B-NATRIURETIC PEPTIDE (CONVERTED LAB): Pro B Natriuretic peptide (BNP): 234 — ABNORMAL HIGH

## 2010-11-13 LAB — APTT: aPTT: 29

## 2010-11-20 ENCOUNTER — Ambulatory Visit (INDEPENDENT_AMBULATORY_CARE_PROVIDER_SITE_OTHER): Payer: Federal, State, Local not specified - PPO | Admitting: Cardiovascular Disease

## 2010-11-20 ENCOUNTER — Encounter: Payer: Self-pay | Admitting: Cardiovascular Disease

## 2010-11-20 DIAGNOSIS — E785 Hyperlipidemia, unspecified: Secondary | ICD-10-CM | POA: Insufficient documentation

## 2010-11-20 DIAGNOSIS — I10 Essential (primary) hypertension: Secondary | ICD-10-CM | POA: Insufficient documentation

## 2010-11-20 LAB — HEPATIC FUNCTION PANEL
ALT: 18 U/L (ref 0–35)
AST: 24 U/L (ref 0–37)
Albumin: 3.9 g/dL (ref 3.5–5.2)
Alkaline Phosphatase: 66 U/L (ref 39–117)
Total Protein: 6.8 g/dL (ref 6.0–8.3)

## 2010-11-20 LAB — BASIC METABOLIC PANEL
Calcium: 8.8 mg/dL (ref 8.4–10.5)
Creatinine, Ser: 0.6 mg/dL (ref 0.4–1.2)
GFR: 100.73 mL/min (ref >60.00)
Sodium: 141 mEq/L (ref 135–145)

## 2010-11-20 LAB — LIPID PANEL
Cholesterol: 147 mg/dL (ref 0–200)
Total CHOL/HDL Ratio: 3
Triglycerides: 84 mg/dL (ref 0.0–149.0)

## 2010-11-20 NOTE — Patient Instructions (Addendum)
Your physician wants you to follow-up in: 6 months You will receive a reminder letter in the mail two months in advance. If you don't receive a letter, please call our office to schedule the follow-up appointment.  Your physician recommends that you return for a FASTING lipid profile: 6 months  You will have fasting lab work today, you will receive a call with results in 3-5 days

## 2010-11-20 NOTE — Progress Notes (Signed)
[name] Date of Birth  [birth year] [facility] [address]    [address] [place], [place]  [address] [phone]  Fax  [phone]  History of Present Illness:  [name] is a 77 year old female with a history of hypertension and history of paroxysmal atrial fibrillation.  She's not had any episodes of chest pain or shortness of breath. She does have some episodes of dizziness occasionally.  She's not had any episodes of chest pain or shortness of breath.  Current Outpatient Prescriptions on File Prior to Visit  Medication Sig Dispense Refill  . metoprolol (LOPRESSOR) 50 MG tablet TAKE 1 TABLET BY MOUTH TWICE DAILY  180 tablet  0  . MICARDIS HCT 40-12.5 MG per tablet TAKE ONE TABLET BY MOUTH DAILY  90 tablet  1  . Multiple Vitamin (MULTIVITAMIN) tablet Take 1 tablet by mouth daily.        . simvastatin (ZOCOR) 20 MG tablet Take 20 mg by mouth at bedtime.        . verapamil (CALAN-SR) 180 MG CR tablet Take 1 tablet (180 mg total) by mouth daily.  90 tablet  3    Allergies  Allergen Reactions  . Sulfa Drugs Cross Reactors     Past Medical History  Diagnosis Date  . Hypertension   . Atrial fibrillation   . MVP (mitral valve prolapse)   . OP (osteoporosis)   . History of rectal cancer   . Fracture of femoral neck, right   . Wrist fracture     left    Past Surgical History  Procedure Date  . Hemiarthroplasty hip     RIGHT  . Inguinal hernia repair   . Transthoracic echocardiogram 08/20/2005    EF 60%  . Hip surgery   . Colonoscopy     History  Smoking status  . Never Smoker   Smokeless tobacco  . Not on file    History  Alcohol Use No    No family history on file.  Reviw of Systems:  Reviewed in the HPI.  All other systems are negative.  Physical Exam: BP 146/73  Pulse 62  Ht 5\' 4"  (1.626 m)  Wt 126 lb 6.4 oz (57.335 kg)  BMI 21.70 kg/m2 The patient is alert and oriented x 3.  The mood and affect are normal.   Skin: warm and dry.   Color is normal.    HEENT:   the sclera are nonicteric.  The mucous membranes are moist.  The carotids are 2+ without bruits.  There is no thyromegaly.  There is no JVD.    Lungs: clear.  The chest wall is non tender.    Heart: regular rate with a normal S1 and S2.  There are no murmurs, gallops, or rubs. The PMI is not displaced.     Abdomen: good bowel sounds.  There is no guarding or rebound.  There is no hepatosplenomegaly or tenderness.  There are no masses.   Extremities:  no clubbing, cyanosis, or edema.  The legs are without rashes.  The distal pulses are intact.   Neuro:  Cranial nerves II - XII are intact.  Motor and sensory functions are intact.    The gait is normal.  ECG: Normal sinus rhythm with first degree AV block. She has no ST or T wave changes.  Assessment / Plan:

## 2010-11-20 NOTE — Assessment & Plan Note (Addendum)
She's on simvastatin 20 mg a day. We will continue with present medications. We'll check a lipid profile, hepatic profile, and basic metabolic profile today.  She's on verapamil.   I would prefer that she be on Lipitor instead of  simvastatin. We'll not make that medicine change today but we will change it again when I see her again in 6 months.

## 2010-11-20 NOTE — Assessment & Plan Note (Signed)
Her blood pressure readings have been well controlled. We will continue with her same medications. I'll see her again in 6 months.

## 2010-11-27 ENCOUNTER — Telehealth: Payer: Self-pay | Admitting: *Deleted

## 2010-11-27 NOTE — Telephone Encounter (Signed)
Advised patient of labs 

## 2010-11-27 NOTE — Telephone Encounter (Signed)
Message copied by Eugenia Pancoast on Wed Nov 27, 2010  9:28 AM ------      Message from: Vesta Mixer      Created: Tue Nov 26, 2010  6:20 PM       Labs are [place].

## 2010-12-09 ENCOUNTER — Other Ambulatory Visit: Payer: Self-pay | Admitting: *Deleted

## 2010-12-09 DIAGNOSIS — I10 Essential (primary) hypertension: Secondary | ICD-10-CM

## 2010-12-09 MED ORDER — TELMISARTAN-HCTZ 40-12.5 MG PO TABS: 1.0000 | ORAL_TABLET | Freq: Every day | ORAL | Status: DC

## 2010-12-12 ENCOUNTER — Other Ambulatory Visit: Payer: Self-pay | Admitting: *Deleted

## 2011-02-14 ENCOUNTER — Other Ambulatory Visit: Payer: Self-pay | Admitting: *Deleted

## 2011-02-14 MED ORDER — SIMVASTATIN 20 MG PO TABS: 20.0000 mg | ORAL_TABLET | Freq: Every day | ORAL | Status: DC

## 2011-02-14 NOTE — Telephone Encounter (Signed)
Fax Received. Refill Completed. [name] (R.M.A)   

## 2011-05-05 ENCOUNTER — Other Ambulatory Visit: Payer: Self-pay | Admitting: *Deleted

## 2011-05-05 MED ORDER — METOPROLOL TARTRATE 50 MG PO TABS: 50.0000 mg | ORAL_TABLET | Freq: Two times a day (BID) | ORAL | Status: DC

## 2011-05-05 NOTE — Telephone Encounter (Signed)
Fax Received. Refill Completed. Mir Fullilove Chowoe (R.M.A)   

## 2011-05-29 ENCOUNTER — Ambulatory Visit (INDEPENDENT_AMBULATORY_CARE_PROVIDER_SITE_OTHER): Payer: Federal, State, Local not specified - PPO | Admitting: Cardiovascular Disease

## 2011-05-29 ENCOUNTER — Encounter: Payer: Self-pay | Admitting: Cardiovascular Disease

## 2011-05-29 VITALS — BP 179/77 | HR 66 | Ht 63.0 in | Wt 125.4 lb

## 2011-05-29 DIAGNOSIS — I10 Essential (primary) hypertension: Secondary | ICD-10-CM

## 2011-05-29 DIAGNOSIS — E785 Hyperlipidemia, unspecified: Secondary | ICD-10-CM

## 2011-05-29 LAB — BASIC METABOLIC PANEL
CO2: 32 mEq/L (ref 19–32)
Calcium: 8.8 mg/dL (ref 8.4–10.5)
Potassium: 4 mEq/L (ref 3.5–5.1)
Sodium: 141 mEq/L (ref 135–145)

## 2011-05-29 LAB — HEPATIC FUNCTION PANEL
AST: 22 U/L (ref 0–37)
Albumin: 3.7 g/dL (ref 3.5–5.2)

## 2011-05-29 LAB — LIPID PANEL
HDL: 54.2 mg/dL (ref >39.00)
Total CHOL/HDL Ratio: 2
Triglycerides: 72 mg/dL (ref 0.0–149.0)

## 2011-05-29 NOTE — Progress Notes (Signed)
[name] Date of Birth  1915-01-06 [facility] [address]    [address] [place], [place]  [address] [phone]  Fax  [phone]  History of Present Illness:  [name] is a 78 year old female with a history of hypertension and history of paroxysmal atrial fibrillation.  She's not had any episodes of chest pain or shortness of breath. She does have some episodes of dizziness occasionally.  She's not had any episodes of chest pain or shortness of breath.  She was taking verapamil. That apparently caused her to have some dizziness so she discontinued it.  Current Outpatient Prescriptions on File Prior to Visit  Medication Sig Dispense Refill  . metoprolol (LOPRESSOR) 50 MG tablet Take 1 tablet (50 mg total) by mouth 2 (two) times daily.  180 tablet  5  . Multiple Vitamin (MULTIVITAMIN) tablet Take 1 tablet by mouth daily.        . simvastatin (ZOCOR) 20 MG tablet Take 1 tablet (20 mg total) by mouth at bedtime.  90 tablet  3  . telmisartan-hydrochlorothiazide (MICARDIS HCT) 40-12.5 MG per tablet Take 1 tablet by mouth daily.  90 tablet  3  . verapamil (CALAN-SR) 180 MG CR tablet Take 1 tablet (180 mg total) by mouth daily.  90 tablet  3    Allergies  Allergen Reactions  . Sulfa Drugs Cross Reactors     Past Medical History  Diagnosis Date  . Hypertension   . Atrial fibrillation   . MVP (mitral valve prolapse)   . OP (osteoporosis)   . History of rectal cancer   . Fracture of femoral neck, right   . Wrist fracture     left    Past Surgical History  Procedure Date  . Hemiarthroplasty hip     RIGHT  . Inguinal hernia repair   . Transthoracic echocardiogram 08/20/2005    EF 60%  . Hip surgery     x2  . Colonoscopy   . Total hip arthroplasty     History  Smoking status  . Never Smoker   Smokeless tobacco  . Not on file    History  Alcohol Use No    No family history on file.  Reviw of Systems:  Reviewed in the HPI.  All other systems are  negative.  Physical Exam: BP 179/77  Pulse 66  Ht 5\' 3"  (1.6 m)  Wt 125 lb 6.4 oz (56.881 kg)  BMI 22.21 kg/m2 The patient is alert and oriented x 3.  The mood and affect are normal.   Skin: warm and dry.  Color is normal.    HEENT:   the sclera are nonicteric.  The mucous membranes are moist.  The carotids are 2+ without bruits.  There is no thyromegaly.  There is no JVD.    Lungs: clear.  The chest wall is non tender.    Heart: regular rate with a normal S1 and S2.  There are no murmurs, gallops, or rubs. The PMI is not displaced.     Abdomen: good bowel sounds.  There is no guarding or rebound.  There is no hepatosplenomegaly or tenderness.  There are no masses.   Extremities:  no clubbing, cyanosis, or edema.  The legs are without rashes.  The distal pulses are intact.   Neuro:  Cranial nerves II - XII are intact.  Motor and sensory functions are intact.    The gait is normal.  ECG: Normal sinus rhythm with first degree AV block. She  has no ST or T wave changes.  Assessment / Plan:

## 2011-05-29 NOTE — Assessment & Plan Note (Signed)
[name] has some elevated blood pressures today. She admits to eating some extra salt. She likes soup. We'll have her decrease her salt intake. We will followup with her in 6 months.

## 2011-05-29 NOTE — Patient Instructions (Signed)
Your physician recommends that you schedule a follow-up appointment in:3  MONTHS   DASH Diet The DASH diet stands for "Dietary Approaches to Stop Hypertension." It is a healthy eating plan that has been shown to reduce high blood pressure (hypertension) in as little as 14 days, while also possibly providing other significant health benefits. These other health benefits include reducing the risk of breast cancer after menopause and reducing the risk of type 2 diabetes, heart disease, colon cancer, and stroke. Health benefits also include weight loss and slowing kidney failure in patients with chronic kidney disease.  DIET GUIDELINES  Limit salt (sodium). Your diet should contain less than 1500 mg of sodium daily.   Limit refined or processed carbohydrates. Your diet should include mostly whole grains. Desserts and added sugars should be used sparingly.   Include small amounts of heart-healthy fats. These types of fats include nuts, oils, and tub margarine. Limit saturated and trans fats. These fats have been shown to be harmful in the body.  CHOOSING FOODS  The following food groups are based on a 2000 calorie diet. See your Registered Dietitian for individual calorie needs. Grains and Grain Products (6 to 8 servings daily)  Eat More Often: Whole-wheat bread, brown rice, whole-grain or wheat pasta, quinoa, popcorn without added fat or salt (air popped).   Eat Less Often: White bread, white pasta, white rice, cornbread.  Vegetables (4 to 5 servings daily)  Eat More Often: Fresh, frozen, and canned vegetables. Vegetables may be raw, steamed, roasted, or grilled with a minimal amount of fat.   Eat Less Often/Avoid: Creamed or fried vegetables. Vegetables in a cheese sauce.  Fruit (4 to 5 servings daily)  Eat More Often: All fresh, canned (in natural juice), or frozen fruits. Dried fruits without added sugar. One hundred percent fruit juice ( cup [237 mL] daily).   Eat Less Often: Dried fruits  with added sugar. Canned fruit in light or heavy syrup.  [employer], Fish, and Poultry (2 servings or less daily. One serving is 3 to 4 oz [85-114 g]).  Eat More Often: Ninety percent or leaner ground beef, tenderloin, sirloin. Round cuts of beef, chicken breast, [place] breast. All fish. Grill, bake, or broil your meat. Nothing should be fried.   Eat Less Often/Avoid: Fatty cuts of meat, [place], or chicken leg, thigh, or wing. Fried cuts of meat or fish.  Dairy (2 to 3 servings)  Eat More Often: Low-fat or fat-free milk, low-fat plain or light yogurt, reduced-fat or part-skim cheese.   Eat Less Often/Avoid: Milk (whole, 2%, skim, or chocolate).Whole milk yogurt. Full-fat cheeses.  Nuts, Seeds, and Legumes (4 to 5 servings per week)  Eat More Often: All without added salt.   Eat Less Often/Avoid: Salted nuts and seeds, canned beans with added salt.  Fats and Sweets (limited)  Eat More Often: Vegetable oils, tub margarines without trans fats, sugar-free gelatin. Mayonnaise and salad dressings.   Eat Less Often/Avoid: Coconut oils, palm oils, butter, stick margarine, cream, half and half, cookies, candy, pie.  FOR MORE INFORMATION The Dash Diet Eating Plan: www.dashdiet.org Document Released: 01/16/2011 Document Reviewed: 01/06/2011 [facility] Patient Information 2012 [place], [place].

## 2011-06-05 ENCOUNTER — Encounter (HOSPITAL_COMMUNITY): Payer: Self-pay | Admitting: *Deleted

## 2011-06-05 ENCOUNTER — Emergency Department (HOSPITAL_COMMUNITY)
Admission: EM | Admit: 2011-06-05 | Discharge: 2011-06-05 | Disposition: A | Discharge status: Home / Self Care | Payer: Federal, State, Local not specified - PPO | Attending: Emergency Medicine | Admitting: Emergency Medicine

## 2011-06-05 ENCOUNTER — Emergency Department (HOSPITAL_COMMUNITY): Payer: Federal, State, Local not specified - PPO

## 2011-06-05 DIAGNOSIS — Y92009 Unspecified place in unspecified non-institutional (private) residence as the place of occurrence of the external cause: Secondary | ICD-10-CM | POA: Insufficient documentation

## 2011-06-05 DIAGNOSIS — W06XXXA Fall from bed, initial encounter: Secondary | ICD-10-CM | POA: Insufficient documentation

## 2011-06-05 DIAGNOSIS — IMO0002 Reserved for concepts with insufficient information to code with codable children: Secondary | ICD-10-CM

## 2011-06-05 DIAGNOSIS — S51809A Unspecified open wound of unspecified forearm, initial encounter: Secondary | ICD-10-CM | POA: Insufficient documentation

## 2011-06-05 DIAGNOSIS — Z23 Encounter for immunization: Secondary | ICD-10-CM | POA: Insufficient documentation

## 2011-06-05 DIAGNOSIS — S40029A Contusion of unspecified upper arm, initial encounter: Secondary | ICD-10-CM

## 2011-06-05 DIAGNOSIS — I10 Essential (primary) hypertension: Secondary | ICD-10-CM | POA: Insufficient documentation

## 2011-06-05 DIAGNOSIS — I4891 Unspecified atrial fibrillation: Secondary | ICD-10-CM | POA: Insufficient documentation

## 2011-06-05 DIAGNOSIS — M25559 Pain in unspecified hip: Secondary | ICD-10-CM | POA: Insufficient documentation

## 2011-06-05 DIAGNOSIS — S5010XA Contusion of unspecified forearm, initial encounter: Secondary | ICD-10-CM | POA: Insufficient documentation

## 2011-06-05 DIAGNOSIS — I059 Rheumatic mitral valve disease, unspecified: Secondary | ICD-10-CM | POA: Insufficient documentation

## 2011-06-05 MED ORDER — TETANUS-DIPHTH-ACELL PERTUSSIS 5-2.5-18.5 LF-MCG/0.5 IM SUSP
0.5000 mL | Freq: Once | INTRAMUSCULAR | Status: AC
  Administered 2011-06-05: 0.5 mL via INTRAMUSCULAR
  Filled 2011-06-05: qty 0.5

## 2011-06-05 MED ORDER — TETANUS-DIPHTH-ACELL PERTUSSIS 5-2.5-18.5 LF-MCG/0.5 IM SUSP: 0.5000 mL | Freq: Once | INTRAMUSCULAR | Status: DC

## 2011-06-05 NOTE — ED Notes (Signed)
Pt undressed, in gown, on continuous pulse oximetry and blood pressure cuff; warm blanket given; family at bedside

## 2011-06-05 NOTE — ED Notes (Signed)
Pt repots falling at home this morning after tripping.  Denies hitting head or LOC.  Pt noted to have a skin tear and hematoma on (R) forearm.  Bleeding controlled.  Pt also reports (L) hip pain.  Pt ambulatory after fall.  Pulses and sensation present.

## 2011-06-05 NOTE — ED Notes (Signed)
Pt. [name] this am while turning too quickly to shut off her light.  Pt. Has a rt. Hematoma/skin tear to her rt. Forearm under her elbow.  Also having pain near her rt. Groin area.  Pt. Denies any blood thinners.   Dressing intact to skin tear, bleeding controlled.

## 2011-06-05 NOTE — Discharge Instructions (Signed)
Contusion A contusion is a deep bruise. Contusions are the result of an injury that caused bleeding under the skin. The contusion may turn blue, purple, or yellow. Minor injuries will give you a painless contusion, but more severe contusions may stay painful and swollen for a few weeks.  CAUSES  A contusion is usually caused by a blow, trauma, or direct force to an area of the body. SYMPTOMS   Swelling and redness of the injured area.   Bruising of the injured area.   Tenderness and soreness of the injured area.   Pain.  DIAGNOSIS  The diagnosis can be made by taking a history and physical exam. An X-ray, CT scan, or MRI may be needed to determine if there were any associated injuries, such as fractures. TREATMENT  Specific treatment will depend on what area of the body was injured. In general, the best treatment for a contusion is resting, icing, elevating, and applying cold compresses to the injured area. Over-the-counter medicines may also be recommended for pain control. Ask your caregiver what the best treatment is for your contusion. HOME CARE INSTRUCTIONS   Put ice on the injured area.   Put ice in a plastic bag.   Place a towel between your skin and the bag.   Leave the ice on for 15 to 20 minutes, 3 to 4 times a day.   Only take over-the-counter or prescription medicines for pain, discomfort, or fever as directed by your caregiver. Your caregiver may recommend avoiding anti-inflammatory medicines (aspirin, ibuprofen, and naproxen) for 48 hours because these medicines may increase bruising.   Rest the injured area.   If possible, elevate the injured area to reduce swelling.  SEEK IMMEDIATE MEDICAL CARE IF:   You have increased bruising or swelling.   You have pain that is getting worse.   Your swelling or pain is not relieved with medicines.  MAKE SURE YOU:   Understand these instructions.   Will watch your condition.   Will get help right away if you are not  doing well or get worse.  Document Released: 11/06/2004 Document Revised: 01/16/2011 Document Reviewed: 12/02/2010 [facility] Patient Information 2012 [place], [place].Hematoma A hematoma is a pocket of blood. The pocket of blood can turn into a hard, painful lump under the skin. Your skin may turn blue or yellow. Most hematomas get better in a few days to weeks. Some hematomas are serious and need medical care. Hematomas can be very small or very big. HOME CARE  Put ice on the injured area.   Put ice in a plastic bag.   Place a towel between your skin and the bag.   Leave the ice on for 15 to 20 minutes, 3 to 4 times a day for the first 1 to 2 days.   After the first 2 days, switch to using warm packs on the injured area.   Raise (elevate) the injured area to lessen pain and puffiness (swelling). You may also wrap the area with an elastic bandage. Make sure the bandage is not wrapped too tight.   If you have a painful hematoma on your leg or foot, you may use crutches for a couple days.   Only take medicines as told by your doctor.  GET HELP RIGHT AWAY IF:   Your pain gets worse.   Your pain is not controlled with medicine.   You have a fever.   Your puffiness gets worse.   Your skin turns more blue or yellow.  Your skin over the hematoma breaks or starts bleeding.  MAKE SURE YOU:   Understand these instructions.   Will watch your condition.   Will get help right away if you are not doing well or get worse.  Document Released: 03/06/2004 Document Revised: 01/16/2011 Document Reviewed: 09/30/2010 [facility] Patient Information 2012 [place], [place].Stitches, Staples, or Skin Adhesive Strips  Stitches (sutures), staples, and skin adhesive strips hold the skin together as it heals. They will usually be in place for 7 days or less. HOME CARE  Wash your hands with soap and water before and after you touch your wound.   Only take medicine as told by your doctor.   Cover your  wound only if your doctor told you to. Otherwise, leave it open to air.   Do not get your stitches wet or dirty. If they get dirty, dab them gently with a clean washcloth. Wet the washcloth with soapy water. Do not rub. Pat them dry gently.   Do not put medicine or medicated cream on your stitches unless your doctor told you to.   Do not take out your own stitches or staples. Skin adhesive strips will fall off by themselves.   Do not pick at the wound. Picking can cause an infection.   Do not miss your follow-up appointment.   If you have problems or questions, call your doctor.  GET HELP RIGHT AWAY IF:   You have a temperature by mouth above 102 F (38.9 C), not controlled by medicine.   You have chills.   You have redness or pain around your stitches.   There is puffiness (swelling) around your stitches.   You notice fluid (drainage) from your stitches.   There is a bad smell coming from your wound.  MAKE SURE YOU:  Understand these instructions.   Will watch your condition.   Will get help if you are not doing well or get worse.  Document Released: 11/24/2008 Document Revised: 01/16/2011 Document Reviewed: 11/24/2008 [facility] Patient Information 2012 [place], [place].Laceration Care, Adult A laceration is a cut or lesion that goes through all layers of the skin and into the tissue just beneath the skin. TREATMENT  Some lacerations may not require closure. Some lacerations may not be able to be closed due to an increased risk of infection. It is important to see your caregiver as soon as possible after an injury to minimize the risk of infection and maximize the opportunity for successful closure. If closure is appropriate, pain medicines may be given, if needed. The wound will be cleaned to help prevent infection. Your caregiver will use stitches (sutures), staples, wound glue (adhesive), or skin adhesive strips to repair the laceration. These tools bring the skin edges  together to allow for faster healing and a better cosmetic outcome. However, all wounds will heal with a scar. Once the wound has healed, scarring can be minimized by covering the wound with sunscreen during the day for 1 full year. HOME CARE INSTRUCTIONS  For sutures or staples:  Keep the wound clean and dry.   If you were given a bandage (dressing), you should change it at least once a day. Also, change the dressing if it becomes wet or dirty, or as directed by your caregiver.   Wash the wound with soap and water 2 times a day. Rinse the wound off with water to remove all soap. Pat the wound dry with a clean towel.   After cleaning, apply a thin layer of the antibiotic  ointment as recommended by your caregiver. This will help prevent infection and keep the dressing from sticking.   You may shower as usual after the first 24 hours. Do not soak the wound in water until the sutures are removed.   Only take over-the-counter or prescription medicines for pain, discomfort, or fever as directed by your caregiver.   Get your sutures or staples removed as directed by your caregiver.  For skin adhesive strips:  Keep the wound clean and dry.   Do not get the skin adhesive strips wet. You may bathe carefully, using caution to keep the wound dry.   If the wound gets wet, pat it dry with a clean towel.   Skin adhesive strips will fall off on their own. You may trim the strips as the wound heals. Do not remove skin adhesive strips that are still stuck to the wound. They will fall off in time.  For wound adhesive:  You may briefly wet your wound in the shower or bath. Do not soak or scrub the wound. Do not swim. Avoid periods of heavy perspiration until the skin adhesive has fallen off on its own. After showering or bathing, gently pat the wound dry with a clean towel.   Do not apply liquid medicine, cream medicine, or ointment medicine to your wound while the skin adhesive is in place. This may  loosen the film before your wound is healed.   If a dressing is placed over the wound, be careful not to apply tape directly over the skin adhesive. This may cause the adhesive to be pulled off before the wound is healed.   Avoid prolonged exposure to sunlight or tanning lamps while the skin adhesive is in place. Exposure to ultraviolet light in the first year will darken the scar.   The skin adhesive will usually remain in place for 5 to 10 days, then naturally fall off the skin. Do not pick at the adhesive film.  You may need a tetanus shot if:  You cannot remember when you had your last tetanus shot.   You have never had a tetanus shot.  If you get a tetanus shot, your arm may swell, get red, and feel warm to the touch. This is common and not a problem. If you need a tetanus shot and you choose not to have one, there is a rare chance of getting tetanus. Sickness from tetanus can be serious. SEEK MEDICAL CARE IF:   You have redness, swelling, or increasing pain in the wound.   You see a red line that goes away from the wound.   You have yellowish-white fluid (pus) coming from the wound.   You have a fever.   You notice a bad smell coming from the wound or dressing.   Your wound breaks open before or after sutures have been removed.   You notice something coming out of the wound such as wood or glass.   Your wound is on your hand or foot and you cannot move a finger or toe.  SEEK IMMEDIATE MEDICAL CARE IF:   Your pain is not controlled with prescribed medicine.   You have severe swelling around the wound causing pain and numbness or a change in color in your arm, hand, leg, or foot.   Your wound splits open and starts bleeding.   You have worsening numbness, weakness, or loss of function of any joint around or beyond the wound.   You develop painful lumps near the  wound or on the skin anywhere on your body.  MAKE SURE YOU:   Understand these instructions.   Will watch  your condition.   Will get help right away if you are not doing well or get worse.  Document Released: 01/27/2005 Document Revised: 01/16/2011 Document Reviewed: 07/23/2010 [facility] Patient Information 2012 [place], [place].

## 2011-06-05 NOTE — ED Provider Notes (Signed)
History    78 year old female #after a fall. Patient is trying to turn off a light on her nightstand she lost her balance and fell. Fell onto her right side. Is complaining of a skin tear near her right elbow and right hip pain. Did not strike her head. Denies headache, neck or back pain. No loss of consciousness. No numbness, tingling or loss of strength. No acute visual changes. Denies use of blood thinning medication. Has been iambulatory since the fall. No change in mental status per her daughter who is at bedside.  CSN: [id number]  Arrival date & time 06/05/11  0716   First MD Initiated Contact with Patient 06/05/11 [id number]      Chief Complaint  Patient presents with  . Arm Injury    (Consider location/radiation/quality/duration/timing/severity/associated sxs/prior treatment) HPI  Past Medical History  Diagnosis Date  . Hypertension   . Atrial fibrillation   . MVP (mitral valve prolapse)   . OP (osteoporosis)   . History of rectal cancer   . Fracture of femoral neck, right   . Wrist fracture     left    Past Surgical History  Procedure Date  . Hemiarthroplasty hip     RIGHT  . Inguinal hernia repair   . Transthoracic echocardiogram 08/20/2005    EF 60%  . Hip surgery     x2  . Colonoscopy   . Total hip arthroplasty     History reviewed. No pertinent family history.  History  Substance Use Topics  . Smoking status: Never Smoker   . Smokeless tobacco: Not on file  . Alcohol Use: No    OB History    Grav Para Term Preterm Abortions TAB SAB Ect Mult Living                  Review of Systems   Review of symptoms negative unless otherwise noted in HPI.   Allergies  Sulfa drugs cross reactors  Home Medications   Current Outpatient Rx  Name Route Sig Dispense Refill  . FERROUS SULFATE 325 (65 FE) MG PO TABS Oral Take 325 mg by mouth daily with breakfast.    . LORATADINE 10 MG PO TABS Oral Take 10 mg by mouth daily. allergies    . METOPROLOL TARTRATE 50  MG PO TABS Oral Take 1 tablet (50 mg total) by mouth 2 (two) times daily. 180 tablet 5  . ADULT MULTIVITAMIN W/MINERALS CH Oral Take 1 tablet by mouth daily.    Marland Kitchen SIMVASTATIN 20 MG PO TABS Oral Take 1 tablet (20 mg total) by mouth at bedtime. 90 tablet 3  . TELMISARTAN-HCTZ 40-12.5 MG PO TABS Oral Take 1 tablet by mouth daily. 90 tablet 3  . VERAPAMIL HCL ER 180 MG PO TBCR Oral Take 180 mg by mouth at bedtime.    . AMOXICILLIN 500 MG PO CAPS  as needed. Has to take before any surgery    . VERAPAMIL HCL ER 180 MG PO TBCR Oral Take 1 tablet (180 mg total) by mouth daily. 90 tablet 3    BP 200/74  Pulse 83  Resp 18  SpO2 98%  Physical Exam  Nursing note and vitals reviewed. Constitutional: She is oriented to person, place, and time. She appears well-developed and well-nourished. No distress.       Sitting up in bed. No acute distress.  HENT:  Head: Normocephalic and atraumatic.  Eyes: Conjunctivae are normal. Pupils are equal, round, and reactive to light. Right eye exhibits no  discharge. Left eye exhibits no discharge.  Neck: Neck supple.  Cardiovascular: Normal rate, regular rhythm and normal heart sounds.  Exam reveals no gallop and no friction rub.   No murmur heard. Pulmonary/Chest: Effort normal and breath sounds normal. No respiratory distress.  Abdominal: Soft. She exhibits no distension. There is no tenderness.  Musculoskeletal: She exhibits no edema and no tenderness.       No midline spinal tenderness. Pelvis is stable. No hip tenderness. Full range of motion active and passively the right hip without significant pain.  Neurological: She is alert and oriented to person, place, and time. No cranial nerve deficit. She exhibits normal muscle tone. Coordination normal.       Good finger to nose and heel to shin testing bilaterally. Rapid alternating finger movements the  Skin: Skin is warm.       Flapped skin tear to the lateral aspect of proximal right forearm. Underlying  hematoma. Mild venous oozing. Total laceration length is approximately 6 cm. Mild underlying bony tenderness. Full range of motion at the elbow without significant increase in pain. Neurovascular intact distally.  Psychiatric: She has a normal mood and affect. Her behavior is normal. Thought content normal.    ED Course  Wound repair Date/Time: 06/05/2011 9:13 AM Performed by: Raeford Razor Authorized by: Raeford Razor Consent: Verbal consent obtained. Patient identity confirmed: verbally with patient Local anesthesia used: no Patient sedated: no Patient tolerance: Patient tolerated the procedure well with no immediate complications. Comments: Skin tear to proximal R forearm. Mild oozing. Skin gently irrigated and partially closed with steristrips. Unable to fully reapproximate skin because of underlying hematoma. Loose gauze dressing then applied.   (including critical care time)  Labs Reviewed - No data to display No results found.   1. Contusion, upper extremity   2. Skin tear       MDM  89yF s/p fall. Hematoma to R elbow region and mild R hip pain. Xrays neg for acute osseous injury. Steri-strips applied to skin tears but only able to partially cover because of underlying hematoma. Mechanical fall and no hx to suggest syncope. Pt did not hit head, has no HA, neck or back pain. Nonfocal neurological exam. Confirmed at baseline mental status by daughter at bedside. Not on blood thinning medications. No external signs of head trauma. Neuroimaging considered but deferred. Pt declining pain medication. Tetanus is updated. Wound care and return precautions discussed       Raeford Razor, MD 06/05/11 [phone]

## 2011-06-05 NOTE — ED Notes (Signed)
Pt getting dressed to be discharged home 

## 2011-06-10 DIAGNOSIS — R55 Syncope and collapse: Secondary | ICD-10-CM

## 2011-06-10 HISTORY — DX: Syncope and collapse: R55

## 2011-06-11 ENCOUNTER — Telehealth: Payer: Self-pay | Admitting: Physician Assistant

## 2011-06-11 ENCOUNTER — Inpatient Hospital Stay (HOSPITAL_COMMUNITY)
Admission: EM | Admit: 2011-06-11 | Discharge: 2011-06-12 | DRG: 312 | Disposition: A | Discharge status: Home / Self Care | Payer: Medicare Other | Attending: Cardiology | Admitting: Cardiology

## 2011-06-11 ENCOUNTER — Emergency Department (HOSPITAL_COMMUNITY): Payer: Medicare Other

## 2011-06-11 ENCOUNTER — Encounter (HOSPITAL_COMMUNITY): Payer: Self-pay | Admitting: *Deleted

## 2011-06-11 DIAGNOSIS — Z96649 Presence of unspecified artificial hip joint: Secondary | ICD-10-CM

## 2011-06-11 DIAGNOSIS — R55 Syncope and collapse: Secondary | ICD-10-CM

## 2011-06-11 DIAGNOSIS — I4891 Unspecified atrial fibrillation: Secondary | ICD-10-CM | POA: Diagnosis present

## 2011-06-11 DIAGNOSIS — I10 Essential (primary) hypertension: Secondary | ICD-10-CM | POA: Diagnosis present

## 2011-06-11 DIAGNOSIS — I4949 Other premature depolarization: Secondary | ICD-10-CM | POA: Diagnosis present

## 2011-06-11 DIAGNOSIS — M810 Age-related osteoporosis without current pathological fracture: Secondary | ICD-10-CM | POA: Diagnosis present

## 2011-06-11 DIAGNOSIS — Z85048 Personal history of other malignant neoplasm of rectum, rectosigmoid junction, and anus: Secondary | ICD-10-CM

## 2011-06-11 DIAGNOSIS — R000 Tachycardia, unspecified: Secondary | ICD-10-CM

## 2011-06-11 DIAGNOSIS — I951 Orthostatic hypotension: Secondary | ICD-10-CM

## 2011-06-11 HISTORY — DX: Tachycardia, unspecified: R00.0

## 2011-06-11 HISTORY — DX: Reserved for inherently not codable concepts without codable children: IMO0001

## 2011-06-11 HISTORY — DX: Pneumonia, unspecified organism: J18.9

## 2011-06-11 HISTORY — DX: Anxiety disorder, unspecified: F41.9

## 2011-06-11 HISTORY — DX: Encounter for other specified aftercare: Z51.89

## 2011-06-11 HISTORY — DX: Headache: R51

## 2011-06-11 HISTORY — DX: Unspecified osteoarthritis, unspecified site: M19.90

## 2011-06-11 HISTORY — DX: Dizziness and giddiness: R42

## 2011-06-11 HISTORY — DX: Syncope and collapse: R55

## 2011-06-11 LAB — CBC
Hemoglobin: 12.5 g/dL (ref 12.0–15.0)
MCH: 32.7 pg (ref 26.0–34.0)
MCHC: 33.5 g/dL (ref 30.0–36.0)

## 2011-06-11 LAB — DIFFERENTIAL
Basophils Relative: 1 % (ref 0–1)
Eosinophils Absolute: 0.1 10*3/uL (ref 0.0–0.7)
Monocytes Absolute: 0.5 10*3/uL (ref 0.1–1.0)
Monocytes Relative: 6 % (ref 3–12)
Neutrophils Relative %: 76 % (ref 43–77)

## 2011-06-11 LAB — URINALYSIS, ROUTINE W REFLEX MICROSCOPIC
Ketones, ur: 15 mg/dL — AB
Nitrite: NEGATIVE
Protein, ur: NEGATIVE mg/dL

## 2011-06-11 LAB — CARDIAC PANEL(CRET KIN+CKTOT+MB+TROPI)
CK, MB: 4.2 ng/mL — ABNORMAL HIGH (ref 0.3–4.0)
Relative Index: 4 — ABNORMAL HIGH (ref 0.0–2.5)
Total CK: 104 U/L (ref 7–177)
Troponin I: <0.3 ng/mL (ref <0.30)

## 2011-06-11 LAB — COMPREHENSIVE METABOLIC PANEL
Albumin: 3.6 g/dL (ref 3.5–5.2)
BUN: 21 mg/dL (ref 6–23)
Creatinine, Ser: 0.43 mg/dL — ABNORMAL LOW (ref 0.50–1.10)
Potassium: 4.6 mEq/L (ref 3.5–5.1)
Total Protein: 7.2 g/dL (ref 6.0–8.3)

## 2011-06-11 LAB — TSH: TSH: 0.879 u[IU]/mL (ref 0.350–4.500)

## 2011-06-11 MED ORDER — IOHEXOL 350 MG/ML SOLN
100.0000 mL | Freq: Once | INTRAVENOUS | Status: AC | PRN
  Administered 2011-06-11: 100 mL via INTRAVENOUS

## 2011-06-11 MED ORDER — FERROUS SULFATE 325 (65 FE) MG PO TABS
325.0000 mg | ORAL_TABLET | Freq: Every day | ORAL | Status: DC
  Administered 2011-06-12: 325 mg via ORAL
  Filled 2011-06-11 (×2): qty 1

## 2011-06-11 MED ORDER — SIMVASTATIN 20 MG PO TABS
20.0000 mg | ORAL_TABLET | Freq: Every day | ORAL | Status: DC
  Administered 2011-06-11: 20 mg via ORAL
  Filled 2011-06-11 (×2): qty 1

## 2011-06-11 MED ORDER — NITROFURANTOIN MONOHYD MACRO 100 MG PO CAPS
100.0000 mg | ORAL_CAPSULE | Freq: Two times a day (BID) | ORAL | Status: DC
  Administered 2011-06-11 – 2011-06-12 (×2): 100 mg via ORAL
  Filled 2011-06-11 (×3): qty 1

## 2011-06-11 MED ORDER — ADULT MULTIVITAMIN W/MINERALS CH
1.0000 | ORAL_TABLET | Freq: Every day | ORAL | Status: DC
  Administered 2011-06-11 – 2011-06-12 (×2): 1 via ORAL
  Filled 2011-06-11 (×2): qty 1

## 2011-06-11 MED ORDER — ACETAMINOPHEN 325 MG PO TABS
650.0000 mg | ORAL_TABLET | ORAL | Status: DC | PRN
  Administered 2011-06-11: 650 mg via ORAL
  Filled 2011-06-11: qty 2

## 2011-06-11 MED ORDER — ONDANSETRON HCL 4 MG/2ML IJ SOLN: 4.0000 mg | Freq: Four times a day (QID) | INTRAMUSCULAR | Status: DC | PRN

## 2011-06-11 MED ORDER — ALPRAZOLAM 0.25 MG PO TABS: 0.2500 mg | ORAL_TABLET | Freq: Two times a day (BID) | ORAL | Status: DC | PRN

## 2011-06-11 MED ORDER — IRBESARTAN 150 MG PO TABS
150.0000 mg | ORAL_TABLET | Freq: Every day | ORAL | Status: DC
  Administered 2011-06-11 – 2011-06-12 (×2): 150 mg via ORAL
  Filled 2011-06-11 (×2): qty 1

## 2011-06-11 MED ORDER — METOPROLOL TARTRATE 25 MG PO TABS
25.0000 mg | ORAL_TABLET | Freq: Two times a day (BID) | ORAL | Status: DC
  Administered 2011-06-11 – 2011-06-12 (×2): 25 mg via ORAL
  Filled 2011-06-11 (×3): qty 1

## 2011-06-11 MED ORDER — SODIUM CHLORIDE 0.9 % IV BOLUS (SEPSIS)
500.0000 mL | Freq: Once | INTRAVENOUS | Status: AC
  Administered 2011-06-11: 500 mL via INTRAVENOUS

## 2011-06-11 MED ORDER — SODIUM CHLORIDE 0.9 % IV SOLN: INTRAVENOUS | Status: AC

## 2011-06-11 MED ORDER — LORATADINE 10 MG PO TABS
10.0000 mg | ORAL_TABLET | Freq: Every day | ORAL | Status: DC
  Administered 2011-06-11 – 2011-06-12 (×2): 10 mg via ORAL
  Filled 2011-06-11 (×2): qty 1

## 2011-06-11 NOTE — Progress Notes (Signed)
Pt admitted to unit from ED. Pt placed on tele, assessed, and VS taken. Pt advised to call staff if needing assistance. Bed alarm turned on and call bell placed within reach. Will continue to monitor.

## 2011-06-11 NOTE — ED Notes (Signed)
Pt is here for dizziness which she has had in the past also.  Pt states that when she woke up to go to the bathroom at 2:30-2:45 in the am to go to the bathroom she first felt the dizziness.  No CP or sob with this.  Pt did not fall but has continued to have dizziness this am.  Pt states that she has fainted in the past when she had a similar dizzy spell.  Pt is alert and oriented, speech clear.  No neuro deficits noted in triage.  Pt states that the dizziness increases when she gets up and stands up.  No CP and no SOB

## 2011-06-11 NOTE — ED Notes (Signed)
Attempted report x 1, name and number given to secretary, pt and family aware of poc

## 2011-06-11 NOTE — H&P (Addendum)
History and Physical  Patient ID: [name] MRN: [id number], SOB: 02-15-1914 78 y.o. Date of Encounter: 06/11/2011, 1:49 PM  Primary Physician: Egbert Garibaldi, NP, NP Primary Cardiologist: Dr. Elease Hashimoto  Chief Complaint: dizziness  HPI: [name] is a delightful 78 y/o lady with a remote hx of afib, HTN, dizziness who presented to [facility] with complaints of dizziness. She has dealt with this for the last several years but over the last month it's gotten worse. She fell last week after turning off a light on her nightstand. She returns today after awakening with severe dizziness at 3 AM. She sat up on the side of the bed and suddenly had the sensation that she was "going to have another spell," which in her mind means "feeling like I am going to lose control and fall over." She stood up and helped herself to the bathroom and by that time the sensation had eased off. She made it back to bed but was unable to fall asleep. She also complains of being tired, and having alternating hot/cold flashes over the last day. No fever. She thinks she's had some dysuria but "has a bad bladder" regularly. Some of these dizzy episodes seem to be related to standing up, but others are triggered by a change in head position such as turning quickly. Her daughter is also concerned because a few months ago her mother injured her neck when tending to her dog and now has occasional sharp neck discomfort when she turns a certain way. Here in the ER, she was noted to be markedly orthostatic with lying/sitting/standing BP of 178/71-123/108-151/89 with HR 75-81-86. She became dizzy with standing up per nursing report. When I sat her up in bed, she had no change in symptoms. CT of the head demonstrated tiny punctate hyperdensity within the left frontal lobe that may represent small calcifications although tiny parenchymal hemorrhage not excluded. She had a CT angio that ruled out PE. She received a 500cc bolus of  IVF earlier this morning.  She also endorses an occasional sensation of heart fluttering that self-terminates quickly, and is not particularly related to the dizziness. She had a palpitation when I was listening to her that correlated with a PVC. She denies any CP, SOB, LEE, orthopnea, PND, diarrhea or change in appetite. No visual changes. She has not yet taken her medicine today.   Past Medical History  Diagnosis Date  . Hypertension   . Atrial fibrillation     08/2005, normal EF at that time  . MVP (mitral valve prolapse)     Not mentioned on 08/2005 echo however  . OP (osteoporosis)   . History of rectal cancer   . Fracture of femoral neck, right 2010  . Wrist fracture     left  . Dizziness      Most Recent Cardiac Studies: 08/2009 2D Echo SUMMARY - Overall left ventricular systolic function was normal. Left ventricular ejection fraction was estimated , range being 60%. There were no left ventricular regional wall motion abnormalities. Left ventricular wall thickness was mildly to moderately increased. - Aortic valve thickness was mildly increased.   Surgical History:  Past Surgical History  Procedure Date  . Hemiarthroplasty hip     RIGHT  . Inguinal hernia repair   . Transthoracic echocardiogram 08/20/2005    EF 60%  . Colonoscopy   . Total hip arthroplasty      Home Meds: Prior to Admission medications   Medication Sig Start Date  End Date Taking? Authorizing Provider  ferrous sulfate (FEOSOL) 325 (65 FE) MG tablet Take 325 mg by mouth daily with breakfast.   Yes Historical Provider, MD  loratadine (CLARITIN) 10 MG tablet Take 10 mg by mouth daily. allergies   Yes Historical Provider, MD  metoprolol (LOPRESSOR) 50 MG tablet Take 1 tablet (50 mg total) by mouth 2 (two) times daily - was supposed to be taking it twice a day but has only been taking it once per day 05/05/11  Yes Vesta Mixer, MD  Multiple Vitamin (MULITIVITAMIN WITH MINERALS) TABS Take 1 tablet by mouth  daily.   Yes Historical Provider, MD  simvastatin (ZOCOR) 20 MG tablet Take 1 tablet (20 mg total) by mouth at bedtime. 02/14/11  Yes Vesta Mixer, MD  telmisartan-hydrochlorothiazide (MICARDIS HCT) 40-12.5 MG per tablet Take 1 tablet by mouth daily. 12/09/10  Yes Vesta Mixer, MD  amoxicillin (AMOXIL) 500 MG capsule as needed. Has to take before any surgery 03/12/11   Historical Provider, MD  The daughter has a piece of paper that only lists micardis, but our records of prescribing indicate this is Micardis HCT.  Allergies:  Allergies  Allergen Reactions  . Sulfa Drugs Cross Reactors     History   Social History  . Marital Status: Widowed    Spouse Name: N/A    Number of Children: N/A  . Years of Education: N/A   Occupational History  . Not on file.   Social History Main Topics  . Smoking status: Never Smoker   . Smokeless tobacco: Not on file  . Alcohol Use: No  . Drug Use: No  . Sexually Active:    Other Topics Concern  . Not on file   Social History Narrative   Originally from [place]. Loves to garden.     Family History  Problem Relation Age of Onset  . Heart disease Neg Hx     Review of Systems: General: negative for fever, night sweats or weight changes. Has had hot/cold flashes Cardiovascular: see above Dermatological: negative for rash Respiratory: negative for cough or wheezing Urologic: negative for hematuria. ?some dysuria Abdominal: negative for vomiting, diarrhea, bright red blood per rectum, melena, or hematemesis. Had vomiting this AM Neurologic: see above All other systems reviewed and are otherwise negative except as noted above.  Labs:   Lab Results  Component Value Date   WBC 8.0 06/11/2011   HGB 12.5 06/11/2011   HCT 37.3 06/11/2011   MCV 97.6 06/11/2011   PLT 210 06/11/2011     Lab 06/11/11 0845  NA 137  K 4.6  CL 102  CO2 29  BUN 21  CREATININE 0.43*  CALCIUM 9.1  PROT 7.2  BILITOT 0.4  ALKPHOS 72  ALT 11  AST 24    GLUCOSE 106*    Basename 06/11/11 0843  CKTOTAL 104  CKMB 4.2*  TROPONINI <0.30   Lab Results  Component Value Date   CHOL 134 05/29/2011   HDL 54.20 05/29/2011   LDLCALC 65 05/29/2011   TRIG 72.0 05/29/2011   Lab Results  Component Value Date   DDIMER 1.28* 06/11/2011    Radiology/Studies:  1. Chest 2 View 06/11/2011  *RADIOLOGY REPORT*  Clinical Data: Weakness, near-syncope, chest tightness, hypertension  CHEST - 2 VIEW  Comparison: 12/17/2007  Findings: Normal heart size and pulmonary vascularity. Calcified tortuous aorta. Emphysematous and chronic bronchitic changes. No definite infiltrate, pleural effusion or pneumothorax. Bones diffusely demineralized. Curvilinear calcification left upper quadrant, by  prior CT question calcified splenic cyst, stable.  IMPRESSION: Emphysematous and bronchitic changes question COPD. No acute abnormalities.  Original Report Authenticated By: Lollie Marrow, M.D.   2. Ct Head Wo Contrast 06/11/2011  *RADIOLOGY REPORT*  Clinical Data: Near syncope.  Hit back of head.  Dizziness.  CT HEAD WITHOUT CONTRAST  Technique:  Contiguous axial images were obtained from the base of the skull through the vertex without contrast.  Comparison: None.  Findings: No skull fracture.  Tiny punctate hyperdensity within the left frontal lobe may represent small calcifications although tiny parenchymal hemorrhage not excluded.  The best way to evaluate this is with follow-up CT scan in 1- 2 weeks (sooner if clinically indicated).  MR imaging may not separate these possibilities.  Prominent small vessel disease type changes without CT evidence of large acute infarct.  Vascular calcifications.  Atrophy without hydrocephalus.  No intracranial mass lesion detected on this unenhanced exam.  Partial opacification superior aspect right maxillary sinus with an appearance suggestive of chronic sinusitis rather post traumatic injury.  IMPRESSION:  Tiny punctate hyperdensity within the left frontal  lobe may represent small calcifications although tiny parenchymal hemorrhage not excluded. Please see above discussion.  Results discussed with Dr. Tanna Savoy 06/11/2011 9:40 a.m.  Original Report Authenticated By: Fuller Canada, M.D.   3. Ct Angio Chest W/cm &/or Wo Cm 06/11/2011  *RADIOLOGY REPORT* IMPRESSION: No CT evidence for acute pulmonary embolus.  No thoracic aortic aneurysm or dissection of the thoracic aorta.  Prominence of the main pulmonary arteries raises the question of pulmonary arterial hypertension.  Biapical pleuroparenchymal scarring with dependent atelectasis in the lower lungs.  Original Report Authenticated By: ERIC A. MANSELL, M.D.    EKG: NSR 82bpm with PAC noted. TWI avL, grossly unchanged from prior except V2 more negative on today's tracing Tele - NSR occ PVCs, one couplet noted  Physical Exam: Blood pressure 173/68, pulse 85, temperature 98.7 F (37.1 C), temperature source Oral, resp. rate 16, SpO2 97.00%. General: Well developed but frail elderly WF in no acute distress. Head: Normocephalic, atraumatic, sclera non-icteric, no xanthomas, nares are without discharge.  Neck: Negative for carotid bruits. JVD not elevated. Lungs: Clear bilaterally to auscultation without wheezes, rales, or rhonchi. Breathing is unlabored. Heart: RRR with S1 S2. No murmurs, rubs, or gallops appreciated. Abdomen: Soft, non-tender, non-distended with normoactive bowel sounds. No hepatomegaly. No rebound/guarding. No obvious abdominal masses. Msk:  Strength and tone appear normal for age. Extremities: No clubbing or cyanosis. No edema.  Distal pedal pulses are 2+ and equal bilaterally. Her R elbow is wrapped and there is ecchymosis noted proximally to that. She also has healing R shoulder ecchymosis. Neuro: Alert and oriented X 3. Moves all extremities spontaneously. Psych:  Responds to questions appropriately with a normal affect.   ASSESSMENT AND PLAN:   1. Dizziness - some of her episodes  sound orthostatic, but she is having other episodes that are unrelated to standing that sound more like vertigo. We doubt her head CT findings would be contributing to such significant symptoms but will ask neurology to weigh in. Follow on tele - may benefit from outpatient event monitoring to capture any rhythm changes at time of events. Will obtain 2D echo. See below re: thoughts on med adjustment after discussion with Dr. Patty Sermons.  2. Orthostatic hypotension with history of HTN - see #1.  3. Palpitations with frequent ectopy (mostly PVCs, occasional vent bigeminy), remote hx of PAF - monitor on tele for arrhythmia.  4. Possible  UTI - with complaints of nausea & possible dysuria, will initiate abx therapy with Macrobid and obtain ucx.  Signed, Ronie Spies PA-C 06/11/2011, 1:49 PM  Patient seen and examined in ER.  I agree with the assessment and physical findings as noted above.She does have significant orthostatic drop in BP noted in ER. She also has symptoms very suggestive of vertigo such as woke her from sleep last night. Her physical exam is unremarkable and negative for nystagmus at this time. No peripheral edema. We will adjust her BP meds and stop her HCTZ and just use plain Micardis. We will adjust her metoprolol to 25 mg BID. We will ask neurology to see. 2D echo pending.

## 2011-06-11 NOTE — ED Notes (Signed)
Pt stood for standing bp, felt a little dizzy but was able to stand to take bp.

## 2011-06-11 NOTE — Consult Note (Signed)
[facility] CONSULT NOTE     Reason for Consult: dizziness    HPI:    [name] is an 78 y.o. female who awoke this morn around 2:300-3:00am as she felt she had to go to the bathroom.  She sat first on the side of the bed but felt too woozy to stand up.  She eventually did and held onto things to get to the bathroom.  After standing up from the toilet, she still felt lightheaded as if she would pass out.  She was able to make it back to her bed.  She also had some "tingling up and down her arms."  This has resolved.  She denied any chest pain, SOB, headache, nausea, vomiting, facial sensory abnormalities, double vision, weakness of an arm or leg, drooling, difficulty talking or understanding, palpitations, fever, new medications, jerking of an extremity.  She did not actually pass out.  She reported she had been on Claritin recently for allergies but had stopped that at the time of the event.  Last week she also felt woozy and fell (passed out or not?) and injured her left arm.  She told another MD that she had occasionally had brief palpitations and there is a history of A. Fib in 2007.  I understand she had a burst of ST today.  ER reported that a supine BP was 173/95 and SBP erect was 123.  She underwent a head CT in the ER that reported small vessel disease, some vascular calcifications, atrophy . On my review, I agree.    Past Medical History  Diagnosis Date  . Hypertension   . MVP (mitral valve prolapse)     Not mentioned on 08/2005 echo however  . OP (osteoporosis)   . History of rectal cancer   . Fracture of femoral neck, right 2010  . Wrist fracture     left  . Dizziness   . Pneumonia     "once"  . Blood transfusion     "my own blood"  . Headache     "terrible while going thru menopause; none since then"  . Arthritis   . Anxiety     "now & then; I get over it"; denies RX  . Syncope 06/10/11    "fell; I've been having these spells; off &  on; last time was maybe 2 yr ago"  . Atrial fibrillation     08/2005, normal EF at that time  . Tachyarrhythmia 06/11/11    burst of ST    Past Surgical History  Procedure Date  . Transthoracic echocardiogram 08/20/2005    EF 60%  . Colonoscopy   . Total hip arthroplasty     bilaterally  . Inguinal hernia repair     right  . Breast cyst excision     right  . Cataract extraction w/ intraocular lens  implant, bilateral   . Colectomy     Family History  Problem Relation Age of Onset  . Heart disease Neg Hx     Social History:  reports that she has never smoked. She has never used smokeless tobacco. She reports that she does not drink alcohol or use illicit drugs.  Allergies  Allergen Reactions  . Sulfa Drugs Cross Reactors     "I don't remember what happens"    Medications:    Prior to Admission:  Prescriptions prior  to admission  Medication Sig Dispense Refill  . ferrous sulfate (FEOSOL) 325 (65 FE) MG tablet Take 325 mg by mouth daily with breakfast.      . loratadine (CLARITIN) 10 MG tablet Take 10 mg by mouth daily. allergies      . metoprolol (LOPRESSOR) 50 MG tablet Take 1 tablet (50 mg total) by mouth 2 (two) times daily.  180 tablet  5  . Multiple Vitamin (MULITIVITAMIN WITH MINERALS) TABS Take 1 tablet by mouth daily.      . simvastatin (ZOCOR) 20 MG tablet Take 1 tablet (20 mg total) by mouth at bedtime.  90 tablet  3  . telmisartan-hydrochlorothiazide (MICARDIS HCT) 40-12.5 MG per tablet Take 1 tablet by mouth daily.  90 tablet  3  . amoxicillin (AMOXIL) 500 MG capsule as needed. Has to take before any surgery       Scheduled:   . ferrous sulfate  325 mg Oral Q breakfast  . irbesartan  150 mg Oral Daily  . loratadine  10 mg Oral Daily  . metoprolol tartrate  25 mg Oral BID  . mulitivitamin with minerals  1 tablet Oral Daily  . nitrofurantoin (macrocrystal-monohydrate)  100 mg Oral Q12H  . simvastatin  20 mg Oral QHS  . sodium chloride  500 mL Intravenous  Once    Review of Systems - See above HPI.  Blood pressure 173/69, pulse 74, temperature 98 F (36.7 C), temperature source Other (Comment), resp. rate 20, height 5\' 3"  (1.6 m), weight 54.7 kg (120 lb 9.5 oz), SpO2 97.00%.   Neurologic Examination:   Mental Status:  Awake, alert, mildly anxious-appearing.  Normal language.  No dysarthria.  Following all directions. Cranial Nerves:  PER.  EOMI.  No nystagmus.  No ptosis.  No facial asymmetry or weakness.  Light touch and pinprick intact and symmetrical in the V2 distributions.  Auditory Acuity intact to general conversation. Motor:  5/5 in UE's and LE's.  Tone normal.  Bandage on left forearm. Sensory:  Light touch and pinprick intact and symmetrical in the upper and lower extremities. DTR's:  2 at the biceps, 1 at the knees.  Toes downgoing. Cerebellar: No tremors notes.  Lab Results  Component Value Date/Time   CHOL 134 05/29/2011 11:28 AM    Results for orders placed during the hospital encounter of 06/11/11 (from the past 48 hour(s))  CARDIAC PANEL(CRET KIN+CKTOT+MB+TROPI)     Status: Abnormal   Collection Time   06/11/11  8:43 AM      Component Value Range Comment   Total CK 104  7 - 177 (U/L)    CK, MB 4.2 (*) 0.3 - 4.0 (ng/mL)    Troponin I <0.30  <0.30 (ng/mL)    Relative Index 4.0 (*) 0.0 - 2.5    CBC     Status: Abnormal   Collection Time   06/11/11  8:45 AM      Component Value Range Comment   WBC 8.0  4.0 - 10.5 (K/uL)    RBC 3.82 (*) 3.87 - 5.11 (MIL/uL)    Hemoglobin 12.5  12.0 - 15.0 (g/dL)    HCT 40.9  81.1 - 91.4 (%)    MCV 97.6  78.0 - 100.0 (fL)    MCH 32.7  26.0 - 34.0 (pg)    MCHC 33.5  30.0 - 36.0 (g/dL)    RDW 78.2  95.6 - 21.3 (%)    Platelets 210  150 - 400 (K/uL)   DIFFERENTIAL  Status: Normal   Collection Time   06/11/11  8:45 AM      Component Value Range Comment   Neutrophils Relative 76  43 - 77 (%)    Neutro Abs 6.1  1.7 - 7.7 (K/uL)    Lymphocytes Relative 17  12 - 46 (%)    Lymphs Abs  1.3  0.7 - 4.0 (K/uL)    Monocytes Relative 6  3 - 12 (%)    Monocytes Absolute 0.5  0.1 - 1.0 (K/uL)    Eosinophils Relative 1  0 - 5 (%)    Eosinophils Absolute 0.1  0.0 - 0.7 (K/uL)    Basophils Relative 1  0 - 1 (%)    Basophils Absolute 0.1  0.0 - 0.1 (K/uL)   COMPREHENSIVE METABOLIC PANEL     Status: Abnormal   Collection Time   06/11/11  8:45 AM      Component Value Range Comment   Sodium 137  135 - 145 (mEq/L)    Potassium 4.6  3.5 - 5.1 (mEq/L) SLIGHT HEMOLYSIS   Chloride 102  96 - 112 (mEq/L)    CO2 29  19 - 32 (mEq/L)    Glucose, Bld 106 (*) 70 - 99 (mg/dL)    BUN 21  6 - 23 (mg/dL)    Creatinine, Ser 4.09 (*) 0.50 - 1.10 (mg/dL)    Calcium 9.1  8.4 - 10.5 (mg/dL)    Total Protein 7.2  6.0 - 8.3 (g/dL)    Albumin 3.6  3.5 - 5.2 (g/dL)    AST 24  0 - 37 (U/L) HEMOLYSIS AT THIS LEVEL MAY AFFECT RESULT   ALT 11  0 - 35 (U/L)    Alkaline Phosphatase 72  39 - 117 (U/L)    Total Bilirubin 0.4  0.3 - 1.2 (mg/dL)    GFR calc non Af Amer 84 (*) >90 (mL/min)    GFR calc Af Amer >90  >90 (mL/min)   D-DIMER, QUANTITATIVE     Status: Abnormal   Collection Time   06/11/11  8:45 AM      Component Value Range Comment   D-Dimer, Quant 1.28 (*) 0.00 - 0.48 (ug/mL-FEU)   URINALYSIS, ROUTINE W REFLEX MICROSCOPIC     Status: Abnormal   Collection Time   06/11/11 12:01 PM      Component Value Range Comment   Color, Urine YELLOW  YELLOW     APPearance CLEAR  CLEAR     Specific Gravity, Urine 1.018  1.005 - 1.030     pH 8.0  5.0 - 8.0     Glucose, UA NEGATIVE  NEGATIVE (mg/dL)    Hgb urine dipstick SMALL (*) NEGATIVE     Bilirubin Urine NEGATIVE  NEGATIVE     Ketones, ur 15 (*) NEGATIVE (mg/dL)    Protein, ur NEGATIVE  NEGATIVE (mg/dL)    Urobilinogen, UA 0.2  0.0 - 1.0 (mg/dL)    Nitrite NEGATIVE  NEGATIVE     Leukocytes, UA MODERATE (*) NEGATIVE    URINE MICROSCOPIC-ADD ON     Status: Abnormal   Collection Time   06/11/11 12:01 PM      Component Value Range Comment   Squamous  Epithelial / LPF RARE  RARE     WBC, UA 11-20  <3 (WBC/hpf)    RBC / HPF 3-6  <3 (RBC/hpf)    Bacteria, UA FEW (*) RARE      Dg Chest 2 View  06/11/2011  *RADIOLOGY REPORT*  Clinical  Data: Weakness, near-syncope, chest tightness, hypertension  CHEST - 2 VIEW  Comparison: 12/17/2007  Findings: Normal heart size and pulmonary vascularity. Calcified tortuous aorta. Emphysematous and chronic bronchitic changes. No definite infiltrate, pleural effusion or pneumothorax. Bones diffusely demineralized. Curvilinear calcification left upper quadrant, by prior CT question calcified splenic cyst, stable.  IMPRESSION: Emphysematous and bronchitic changes question COPD. No acute abnormalities.  Original Report Authenticated By: Lollie Marrow, M.D.   Ct Head Wo Contrast  06/11/2011  *RADIOLOGY REPORT*  Clinical Data: Near syncope.  Hit back of head.  Dizziness.  CT HEAD WITHOUT CONTRAST  Technique:  Contiguous axial images were obtained from the base of the skull through the vertex without contrast.  Comparison: None.  Findings: No skull fracture.  Tiny punctate hyperdensity within the left frontal lobe may represent small calcifications although tiny parenchymal hemorrhage not excluded.  The best way to evaluate this is with follow-up CT scan in 1- 2 weeks (sooner if clinically indicated).  MR imaging may not separate these possibilities.  Prominent small vessel disease type changes without CT evidence of large acute infarct.  Vascular calcifications.  Atrophy without hydrocephalus.  No intracranial mass lesion detected on this unenhanced exam.  Partial opacification superior aspect right maxillary sinus with an appearance suggestive of chronic sinusitis rather post traumatic injury.  IMPRESSION:  Tiny punctate hyperdensity within the left frontal lobe may represent small calcifications although tiny parenchymal hemorrhage not excluded. Please see above discussion.  Results discussed with Dr. Tanna Savoy 06/11/2011 9:40 a.m.   Original Report Authenticated By: Fuller Canada, M.D.   Ct Angio Chest W/cm &/or Wo Cm  06/11/2011  *RADIOLOGY REPORT*  Clinical Data: Headache with nausea, dizziness, and fatigue.  CT ANGIOGRAPHY CHEST  Technique:  Multidetector CT imaging of the chest using the standard protocol during bolus administration of intravenous contrast. Multiplanar reconstructed images including MIPs were obtained and reviewed to evaluate the vascular anatomy.  Contrast: OMNIPAQUE IOHEXOL 350 MG/ML SOLN  Comparison: 12/15/2007  Findings: There is no filling defect in the opacified pulmonary arteries to suggest the presence of an acute pulmonary embolus. Left main pulmonary artery measures 2.9 cm in diameter.  Right main pulmonary artery measures up to 2.2 cm in diameter.  The no thoracic aortic aneurysm.  There is no dissection of the thoracic aorta.  The heart is at upper limits of normal for size.  Coronary artery calcification is evident.  There is no pericardial or pleural effusion.  Lung windows reveal biapical pleuroparenchymal scarring, stable. There is some dependent atelectasis in the lung bases.  No edema or focal pneumonia.  Images which include the upper abdomen show a stable benign- appearing calcification in the spleen.  Bone windows reveal no worrisome lytic or sclerotic osseous lesions.  IMPRESSION: No CT evidence for acute pulmonary embolus.  No thoracic aortic aneurysm or dissection of the thoracic aorta.  Prominence of the main pulmonary arteries raises the question of pulmonary arterial hypertension.  Biapical pleuroparenchymal scarring with dependent atelectasis in the lower lungs.  Original Report Authenticated By: ERIC A. MANSELL, M.D.     Assessment/Plan:   Assessment: 1.  Near syncopy with reported orthostasis.  No focality to suggest TIA/VBI.  No history of migraines. 2.  Atherosclerotic risk factors and small vessel disease on CT brain imaging.  Recommendations: 1.  Consider TSH if not  done recently. 2.  Consider ASA 81 mg po q day after full cardiology evaluation.  Otherwise, no acute neurological recommendations.  Please call if needed.  Hayden Rasmussen, MD [facility] [phone]  06/11/2011, 5:46 PM

## 2011-06-11 NOTE — Progress Notes (Signed)
Cosign for Karen Wall RN assessment, med admin, notes, and I/O 

## 2011-06-11 NOTE — Telephone Encounter (Signed)
The patient's daughter called to let us know she was taking her mom to the emergency room with hot flashes, cold sweats, headache, nausea and dizziness as well as fatigue. I agreed with this plan given her age and the multitude of things this could represent. She expressed gratitude and will be proceeding to [facility] with her mother. Apollos Tenbrink PA-C

## 2011-06-11 NOTE — ED Provider Notes (Signed)
History     CSN: [id number]  Arrival date & time 06/11/11  0814   First MD Initiated Contact with Patient 06/11/11 0825      Chief Complaint  Patient presents with  . Dizziness    (Consider location/radiation/quality/duration/timing/severity/associated sxs/prior treatment) HPI Comments: Patient woke up this morning and felt lightheaded and near syncopal. She was able to ambulate to the bathroom has not felt good since. She continues to have a lightheadedness and what she describes as mild shortness of breath and a funny feeling in her chest. She describes it more as she is cognizant of her heart beats but does not particularly complain of palpitations.  Patient is a 78 y.o. female presenting with syncope.  Loss of Consciousness This is a new (near syncope) problem. The current episode started 3 to 5 hours ago. The problem occurs constantly. Progression since onset: waxing and waning. Associated symptoms include shortness of breath. Pertinent negatives include no abdominal pain and no headaches. Associated symptoms comments: Chest is feeling funny and can feel her heart beat, nausea. The symptoms are aggravated by walking and standing. The symptoms are relieved by nothing (took an ASA this am after sx started without improvement.). She has tried ASA for the symptoms. The treatment provided no relief.    Past Medical History  Diagnosis Date  . Hypertension   . Atrial fibrillation   . MVP (mitral valve prolapse)   . OP (osteoporosis)   . History of rectal cancer   . Fracture of femoral neck, right   . Wrist fracture     left    Past Surgical History  Procedure Date  . Hemiarthroplasty hip     RIGHT  . Inguinal hernia repair   . Transthoracic echocardiogram 08/20/2005    EF 60%  . Hip surgery     x2  . Colonoscopy   . Total hip arthroplasty     No family history on file.  History  Substance Use Topics  . Smoking status: Never Smoker   . Smokeless tobacco: Not on file  .  Alcohol Use: No    OB History    Grav Para Term Preterm Abortions TAB SAB Ect Mult Living                  Review of Systems  Constitutional: Positive for fatigue. Negative for fever.  HENT: Positive for rhinorrhea.        Seasonal allergies  Respiratory: Positive for shortness of breath.   Cardiovascular: Positive for syncope.  Gastrointestinal: Negative for abdominal pain.  Genitourinary: Negative for dysuria, urgency and frequency.  Neurological: Positive for dizziness, tremors, weakness and light-headedness. Negative for headaches.       Near syncope  Psychiatric/Behavioral: Negative for confusion.  All other systems reviewed and are negative.    Allergies  Sulfa drugs cross reactors  Home Medications   Current Outpatient Rx  Name Route Sig Dispense Refill  . AMOXICILLIN 500 MG PO CAPS  as needed. Has to take before any surgery    . FERROUS SULFATE 325 (65 FE) MG PO TABS Oral Take 325 mg by mouth daily with breakfast.    . LORATADINE 10 MG PO TABS Oral Take 10 mg by mouth daily. allergies    . METOPROLOL TARTRATE 50 MG PO TABS Oral Take 1 tablet (50 mg total) by mouth 2 (two) times daily. 180 tablet 5  . ADULT MULTIVITAMIN W/MINERALS CH Oral Take 1 tablet by mouth daily.    Marland Kitchen SIMVASTATIN  20 MG PO TABS Oral Take 1 tablet (20 mg total) by mouth at bedtime. 90 tablet 3  . TETANUS-DIPHTH-ACELL PERTUSSIS 5-2.5-18.5 LF-MCG/0.5 IM SUSP Intramuscular Inject 0.5 mLs into the muscle once. 0.5 mL 0  . TELMISARTAN-HCTZ 40-12.5 MG PO TABS Oral Take 1 tablet by mouth daily. 90 tablet 3  . VERAPAMIL HCL ER 180 MG PO TBCR Oral Take 1 tablet (180 mg total) by mouth daily. 90 tablet 3  . VERAPAMIL HCL ER 180 MG PO TBCR Oral Take 180 mg by mouth at bedtime.      BP 178/71  Pulse 75  Temp(Src) 98.7 F (37.1 C) (Oral)  Resp 20  SpO2 100%  Physical Exam  Nursing note and vitals reviewed. Constitutional: She is oriented to person, place, and time. She appears well-developed and  well-nourished. No distress.  HENT:  Head: Normocephalic and atraumatic.  Right Ear: Tympanic membrane and ear canal normal.  Left Ear: Tympanic membrane and ear canal normal.  Mouth/Throat: Oropharynx is clear and moist and mucous membranes are normal.  Eyes: Conjunctivae and EOM are normal. Pupils are equal, round, and reactive to light.  Neck: Normal range of motion. Neck supple.  Cardiovascular: Normal rate, regular rhythm and intact distal pulses.   Occasional extrasystoles are present.  No murmur heard. Pulmonary/Chest: Effort normal and breath sounds normal. No respiratory distress. She has no wheezes. She has no rales.  Abdominal: Soft. She exhibits no distension. There is no tenderness. There is no rebound and no guarding.  Musculoskeletal: Normal range of motion. She exhibits no edema and no tenderness.  Neurological: She is alert and oriented to person, place, and time. She has normal strength. No cranial nerve deficit or sensory deficit. Coordination normal.  Skin: Skin is warm and dry. No rash noted. No erythema.  Psychiatric: She has a normal mood and affect. Her behavior is normal.    ED Course  Procedures (including critical care time)  Labs Reviewed  CBC - Abnormal; Notable for the following:    RBC 3.82 (*)    All other components within normal limits  COMPREHENSIVE METABOLIC PANEL - Abnormal; Notable for the following:    Glucose, Bld 106 (*)    Creatinine, Ser 0.43 (*)    GFR calc non Af Amer 84 (*)    All other components within normal limits  CARDIAC PANEL(CRET KIN+CKTOT+MB+TROPI) - Abnormal; Notable for the following:    CK, MB 4.2 (*)    Relative Index 4.0 (*)    All other components within normal limits  URINALYSIS, ROUTINE W REFLEX MICROSCOPIC - Abnormal; Notable for the following:    Hgb urine dipstick SMALL (*)    Ketones, ur 15 (*)    Leukocytes, UA MODERATE (*)    All other components within normal limits  D-DIMER, QUANTITATIVE - Abnormal; Notable  for the following:    D-Dimer, Quant 1.28 (*)    All other components within normal limits  URINE MICROSCOPIC-ADD ON - Abnormal; Notable for the following:    Bacteria, UA FEW (*)    All other components within normal limits  DIFFERENTIAL   Dg Chest 2 View  06/11/2011  *RADIOLOGY REPORT*  Clinical Data: Weakness, near-syncope, chest tightness, hypertension  CHEST - 2 VIEW  Comparison: 12/17/2007  Findings: Normal heart size and pulmonary vascularity. Calcified tortuous aorta. Emphysematous and chronic bronchitic changes. No definite infiltrate, pleural effusion or pneumothorax. Bones diffusely demineralized. Curvilinear calcification left upper quadrant, by prior CT question calcified splenic cyst, stable.  IMPRESSION: Emphysematous  and bronchitic changes question COPD. No acute abnormalities.  Original Report Authenticated By: Lollie Marrow, M.D.   Ct Head Wo Contrast  06/11/2011  *RADIOLOGY REPORT*  Clinical Data: Near syncope.  Hit back of head.  Dizziness.  CT HEAD WITHOUT CONTRAST  Technique:  Contiguous axial images were obtained from the base of the skull through the vertex without contrast.  Comparison: None.  Findings: No skull fracture.  Tiny punctate hyperdensity within the left frontal lobe may represent small calcifications although tiny parenchymal hemorrhage not excluded.  The best way to evaluate this is with follow-up CT scan in 1- 2 weeks (sooner if clinically indicated).  MR imaging may not separate these possibilities.  Prominent small vessel disease type changes without CT evidence of large acute infarct.  Vascular calcifications.  Atrophy without hydrocephalus.  No intracranial mass lesion detected on this unenhanced exam.  Partial opacification superior aspect right maxillary sinus with an appearance suggestive of chronic sinusitis rather post traumatic injury.  IMPRESSION:  Tiny punctate hyperdensity within the left frontal lobe may represent small calcifications although tiny  parenchymal hemorrhage not excluded. Please see above discussion.  Results discussed with Dr. Tanna Savoy 06/11/2011 9:40 a.m.  Original Report Authenticated By: Fuller Canada, M.D.   Ct Angio Chest W/cm &/or Wo Cm  06/11/2011  *RADIOLOGY REPORT*  Clinical Data: Headache with nausea, dizziness, and fatigue.  CT ANGIOGRAPHY CHEST  Technique:  Multidetector CT imaging of the chest using the standard protocol during bolus administration of intravenous contrast. Multiplanar reconstructed images including MIPs were obtained and reviewed to evaluate the vascular anatomy.  Contrast: OMNIPAQUE IOHEXOL 350 MG/ML SOLN  Comparison: 12/15/2007  Findings: There is no filling defect in the opacified pulmonary arteries to suggest the presence of an acute pulmonary embolus. Left main pulmonary artery measures 2.9 cm in diameter.  Right main pulmonary artery measures up to 2.2 cm in diameter.  The no thoracic aortic aneurysm.  There is no dissection of the thoracic aorta.  The heart is at upper limits of normal for size.  Coronary artery calcification is evident.  There is no pericardial or pleural effusion.  Lung windows reveal biapical pleuroparenchymal scarring, stable. There is some dependent atelectasis in the lung bases.  No edema or focal pneumonia.  Images which include the upper abdomen show a stable benign- appearing calcification in the spleen.  Bone windows reveal no worrisome lytic or sclerotic osseous lesions.  IMPRESSION: No CT evidence for acute pulmonary embolus.  No thoracic aortic aneurysm or dissection of the thoracic aorta.  Prominence of the main pulmonary arteries raises the question of pulmonary arterial hypertension.  Biapical pleuroparenchymal scarring with dependent atelectasis in the lower lungs.  Original Report Authenticated By: ERIC A. MANSELL, M.D.     Date: 06/11/2011  Rate: 82  Rhythm: normal sinus rhythm with PAC  QRS Axis: normal  Intervals: normal  ST/T Wave abnormalities: normal   Conduction Disutrbances:none  Narrative Interpretation:   Old EKG Reviewed: unchanged   1. Near syncope   2. Orthostasis       MDM   Patient with a near syncopal episode that started today at 3 AM. She states she went up to go to the bathroom and has felt bad ever since. She is complaining of lightheadedness, mild shortness of breath, and she describes it as a funny chest feeling but will not say that it's chest pain. She states it's worse with standing and better if she sits. She also has mild nausea but  no diaphoresis. She does have a history of a mitral valve prolapse and 8. But she is not currently on anticoagulation. Patient states she has had one prior episode similar to this when she bent over last week and when she stood up she felt near-syncopal and fell hitting her arm. The daughter is unsure if she hit her head at that time and she has not been evaluated for any brain trauma. Patient is mildly hypertensive here but otherwise well-appearing. Her exam is normal without any focal signs of weakness suggestive of stroke. Concern for cardiac cause of complaints versus infection versus delayed head bleed after a fall one week ago versus PE due to shortness of breath and strange feeling in her chest. CBC, CMP, cardiac enzymes, UA, d-dimer, chest x-ray, EKG, head CT pending  All labs are within normal limits other than elevated d-dimer. EKG within normal limits. Head CT the possibility of some small punctate bleeds versus calcifications. The only way to reevaluate this is a repeat head CT in 5-7 days. When orthostatics done on the patient she states was standing she felt much worse and her blood pressure went from 173/95 lying to 123 standing.  CT of the chest ordered to rule out PE.   1:13 PM No PE. Patient was admitted by cardiology for further evaluation of her hypotension.  Gwyneth Sprout, MD 06/11/11 1313

## 2011-06-12 ENCOUNTER — Telehealth: Payer: Self-pay | Admitting: Cardiovascular Disease

## 2011-06-12 ENCOUNTER — Other Ambulatory Visit: Payer: Self-pay

## 2011-06-12 DIAGNOSIS — R55 Syncope and collapse: Secondary | ICD-10-CM

## 2011-06-12 DIAGNOSIS — I951 Orthostatic hypotension: Secondary | ICD-10-CM

## 2011-06-12 DIAGNOSIS — I369 Nonrheumatic tricuspid valve disorder, unspecified: Secondary | ICD-10-CM

## 2011-06-12 LAB — CBC
MCH: 32.3 pg (ref 26.0–34.0)
MCV: 97.1 fL (ref 78.0–100.0)
Platelets: 211 10*3/uL (ref 150–400)
RDW: 12.2 % (ref 11.5–15.5)
WBC: 8.3 10*3/uL (ref 4.0–10.5)

## 2011-06-12 LAB — BASIC METABOLIC PANEL
Calcium: 8.8 mg/dL (ref 8.4–10.5)
Chloride: 107 mEq/L (ref 96–112)
Creatinine, Ser: 0.53 mg/dL (ref 0.50–1.10)
GFR calc Af Amer: >90 mL/min (ref >90)

## 2011-06-12 LAB — LIPID PANEL: Cholesterol: 102 mg/dL (ref 0–200)

## 2011-06-12 MED ORDER — METOPROLOL TARTRATE 25 MG PO TABS: 25.0000 mg | ORAL_TABLET | Freq: Two times a day (BID) | ORAL | Status: DC

## 2011-06-12 MED ORDER — IRBESARTAN 150 MG PO TABS: 150.0000 mg | ORAL_TABLET | Freq: Every day | ORAL | Status: DC

## 2011-06-12 MED ORDER — NITROFURANTOIN MONOHYD MACRO 100 MG PO CAPS: 100.0000 mg | ORAL_CAPSULE | Freq: Two times a day (BID) | ORAL | Status: AC

## 2011-06-12 NOTE — Telephone Encounter (Signed)
Message copied by Vesta Mixer on Thu Jun 12, 2011  5:13 PM ------      Message from: Vesta Mixer      Created: Thu Jun 12, 2011  8:28 AM       Bill > 30 minutes dc if pat goes home

## 2011-06-12 NOTE — Progress Notes (Signed)
  Echocardiogram 2D Echocardiogram has been performed.  Cathie Beams Deneen 06/12/2011, 9:10 AM

## 2011-06-12 NOTE — Progress Notes (Signed)
Utilization review completed. Baird Polinski, RN, BSN. 06/12/11 

## 2011-06-12 NOTE — Progress Notes (Signed)
Pts rhythm is first degree heart block this AM.  Dr. Elease Hashimoto was notified.  Will continue to monitor.

## 2011-06-12 NOTE — Discharge Summary (Signed)
Discharge Summary   Patient ID: [name] MRN: [id number], DOB/AGE: [birth year] 78 y.o.  Primary Provider: Egbert Garibaldi, NP Primary Cardiologist: Dr. Elease Hashimoto Admit date: 06/11/2011 D/C date:     06/12/2011      Primary Discharge Diagnoses:  1. Orthostatic Hypotension  - BP meds adjusted: HCTZ dc'd, Lopressor decreased   2. Dysuria  - Urinalysis w/ (+) leukocytes, nitrite negative; urine culture pending  - Macrobid initiated on 5/1  Secondary Discharge Diagnoses:  1. Atrial Fibrillation 2. Hypertension 3. Mitral Valve Prolapse 4. H/o Rectal Cancer 5. Osteoporosis 6. Right Femoral neck fracture 2010 7. Left Wrist fracture  Allergies Allergen Reactions  . Sulfa Drugs Cross Reactors "I don't remember what happens"    Diagnostic Studies/Procedures:   06/12/11 - 2D Echocardiogram Study Conclusions: - Left ventricle: The cavity size was normal. Wall thickness was increased in a pattern of mild LVH. Systolic function was vigorous. The estimated ejection fraction was in the range of 65% to 70%. Wall motion was normal; there were no regional wall motion abnormalities. Doppler parameters are consistent with abnormal left ventricular relaxation (grade 1 diastolic dysfunction).  - Mitral valve: Calcified annulus. Mildly thickened leaflets  - Pulmonary arteries: PA peak pressure: 35mm Hg (S).  06/11/2011 - CT Head  Findings: No skull fracture.  Tiny punctate hyperdensity within the left frontal lobe may represent small calcifications although tiny parenchymal hemorrhage not excluded.  The best way to evaluate this is with follow-up CT scan in 1- 2 weeks (sooner if clinically indicated).  MR imaging may not separate these possibilities.  Prominent small vessel disease type changes without CT evidence of large acute infarct.  Vascular calcifications.  Atrophy without hydrocephalus.  No intracranial mass lesion detected on this unenhanced exam.  Partial opacification superior aspect  right maxillary sinus with an appearance suggestive of chronic sinusitis rather post traumatic injury.  IMPRESSION:  Tiny punctate hyperdensity within the left frontal lobe may represent small calcifications although tiny parenchymal hemorrhage not excluded.   06/11/2011  - CTA Chest Findings: There is no filling defect in the opacified pulmonary arteries to suggest the presence of an acute pulmonary embolus. Left main pulmonary artery measures 2.9 cm in diameter.  Right main pulmonary artery measures up to 2.2 cm in diameter.  The no thoracic aortic aneurysm.  There is no dissection of the thoracic aorta.  The heart is at upper limits of normal for size.  Coronary artery calcification is evident.  There is no pericardial or pleural effusion.  Lung windows reveal biapical pleuroparenchymal scarring, stable. There is some dependent atelectasis in the lung bases.  No edema or focal pneumonia.  Images which include the upper abdomen show a stable benign- appearing calcification in the spleen.  Bone windows reveal no worrisome lytic or sclerotic osseous lesions.  IMPRESSION: No CT evidence for acute pulmonary embolus.  No thoracic aortic aneurysm or dissection of the thoracic aorta.  Prominence of the main pulmonary arteries raises the question of pulmonary arterial hypertension.  Biapical pleuroparenchymal scarring with dependent atelectasis in the lower lungs.     History of Present Illness: 78 y.o. female w/ the above medical problems who presented to [facility] on 06/11/11 with complaints of dizziness.  She has dealt with dizziness for the last several years but over the last month it's gotten worse. She fell last week after turning off a light on her nightstand. On the day of presentation she awoke with severe dizziness at 3 AM, sat up  on the side of the bed and suddenly had the sensation that she was "going to have another spell," which in her mind means "feeling like I am going to lose control and  fall over." She stood up and helped herself to the bathroom and by that time the sensation had eased off. Some of these dizzy episodes seem to be related to standing up, but others are triggered by a change in head position such as turning quickly.   Hospital Course:  In the ED, she was noted to be markedly orthostatic and became dizzy with standing up.  CT of the head demonstrated tiny punctate hyperdensity within the left frontal lobe that may represent small calcifications although tiny parenchymal hemorrhage not excluded. DDimer was elevated and CTA chest ruled out PE. EKG revealed NSR with no significant ST/T changes. CXR was without acute cardiopulmonary abnormalities. Labs were significant for normal troponin, CKMB 4.2, WBC 8.0, H&H 12.5/37.3, grossly normal CMET. It was felt her symptoms were likely related to orthostasis and possibly vertigo. She was admitted for further evaluation and treatment.  Her BP meds were adjusted with discontinuation of HCTZ and decrease in Lopressor. She was monitored on telemetry without any arrhythmias noted, only occasional PACs/PVCs. TSH was normal. She was initiated on Macrobid for c/o of dysuria. Urinalysis showed moderate leukocytes, but was nitrite negative. Urine culture is pending. Echocardiogram was completed showing mild LVH, EF 65-70%, no RWMAs, and grade 1 diastolic dysfunction. She was evaluated by neurology who felt there were no acute neurological reasons for her presentation and recommended daily low dose aspirin. She was able to ambulate without any further orthostasis or complaints of dizziness.   She was seen and evaluated by Dr. Elease Hashimoto who felt she was stable for discharge home with plans for follow up as scheduled below.  Discharge Vitals: Blood pressure 130/62, pulse 77, temperature 97.5 F (36.4 C), temperature source Oral, resp. rate 16, height 5\' 3"  (1.6 m), weight 120 lb 5.9 oz (54.6 kg), SpO2 100.00%.  Labs: Component Value Date   WBC 8.3  06/12/2011   HGB 12.1 06/12/2011   HCT 36.4 06/12/2011   MCV 97.1 06/12/2011   PLT 211 06/12/2011    Lab 06/12/11 0541 06/11/11 0845  NA 142 --  K 3.5 --  CL 107 --  CO2 29 --  BUN 18 --  CREATININE 0.53 --  CALCIUM 8.8 --  PROT -- 7.2  BILITOT -- 0.4  ALKPHOS -- 72  ALT -- 11  AST -- 24  GLUCOSE 92 --   Basename 06/11/11 0843  CKTOTAL 104  CKMB 4.2*  TROPONINI <0.30   Component Value Date   CHOL 102 06/12/2011   HDL 53 06/12/2011   LDLCALC 38 06/12/2011   TRIG 56 06/12/2011   Component Value Date   DDIMER 1.28* 06/11/2011     06/11/2011 16:53  TSH 0.879     Discharge Medications   Medication List  As of 06/12/2011  1:15 PM   STOP taking these medications         telmisartan-hydrochlorothiazide 40-12.5 MG per tablet         TAKE these medications         amoxicillin 500 MG capsule   Commonly known as: AMOXIL   as needed. Has to take before any surgery      FEOSOL 325 (65 FE) MG tablet   Generic drug: ferrous sulfate   Take 325 mg by mouth daily with breakfast.  irbesartan 150 MG tablet   Commonly known as: AVAPRO   Take 1 tablet (150 mg total) by mouth daily.      loratadine 10 MG tablet   Commonly known as: CLARITIN   Take 10 mg by mouth daily. allergies      metoprolol tartrate 25 MG tablet   Commonly known as: LOPRESSOR   Take 1 tablet (25 mg total) by mouth 2 (two) times daily.      mulitivitamin with minerals Tabs   Take 1 tablet by mouth daily.      nitrofurantoin (macrocrystal-monohydrate) 100 MG capsule   Commonly known as: MACROBID   Take 1 capsule (100 mg total) by mouth every 12 (twelve) hours.      simvastatin 20 MG tablet   Commonly known as: ZOCOR   Take 1 tablet (20 mg total) by mouth at bedtime.            Disposition   Discharge Orders    Future Appointments: Provider: Department: Dept Phone: Center:   06/27/2011 9:30 AM Vesta Mixer, MD [facility] [phone] None     Future Orders Please Complete By Expires   Diet  - low sodium heart healthy      Increase activity slowly      Discharge instructions      Comments:   **PLEASE REMEMBER TO BRING ALL OF YOUR MEDICATIONS TO EACH OF YOUR FOLLOW-UP OFFICE VISITS.      Follow-up Information    Follow up with Elyn Aquas., MD on 06/27/2011. (9:30)    Contact information:   [facility] [address] [place] [address] [phone]           Outstanding Labs/Studies:  1. Follow up urine culture   Duration of Discharge Encounter: Greater than 30 minutes including physician and PA time.  Signed, HOPE, JESSICA PA-C 06/12/2011, 1:15 PM  Attending Note:   The patient was seen and examined.  Agree with assessment and plan as noted above.  See my note from earlier today.  Vesta Mixer, Montez Hageman., MD, [facility] 06/12/2011, 5:11 PM

## 2011-06-12 NOTE — Progress Notes (Signed)
PROGRESS NOTE  Subjective:   [name] is a 78 yo , previous patient of Dr. Deborah Chalk.  She was admitted with orthostasis/ presyncope.  I saw her in the office a month ago.  We have been gradually decreasing her BP meds.  She is feeling better this am.    Objective:    Vital Signs:   Temp:  [97.5 F (36.4 C)-98 F (36.7 C)] 97.5 F (36.4 C) (05/02 0600) Pulse Rate:  [72-88] 80  (05/02 0600) Resp:  [16-21] 16  (05/02 0600) BP: (123-178)/(47-108) 159/67 mmHg (05/02 0600) SpO2:  [97 %-100 %] 100 % (05/02 0600) Weight:  [120 lb 5.9 oz (54.6 kg)-120 lb 9.5 oz (54.7 kg)] 120 lb 5.9 oz (54.6 kg) (05/02 0600)  Last BM Date: 06/11/11   24-hour weight change: Weight change:   Weight trends: Filed Weights   06/11/11 1630 06/12/11 0600  Weight: 120 lb 9.5 oz (54.7 kg) 120 lb 5.9 oz (54.6 kg)    Intake/Output:  05/01 0701 - 05/02 0700 In: 200 [P.O.:200] Out: 660 [Urine:660]     Physical Exam: BP 159/67  Pulse 80  Temp(Src) 97.5 F (36.4 C) (Oral)  Resp 16  Ht 5\' 3"  (1.6 m)  Wt 120 lb 5.9 oz (54.6 kg)  BMI 21.32 kg/m2  SpO2 100%  General: Vital signs reviewed and noted. Well-developed, well-nourished, in no acute distress; alert, appropriate and cooperative throughout examination.  Head: Normocephalic, atraumatic.  Eyes: conjunctivae/corneas clear. PERRL, EOM's intact. Fundi benign.  Throat: Oropharynx nonerythematous, no exudate appreciated.   Neck: Supple. Normal carotids. No JVD  Lungs:  Clear bilaterally to auscultation without wheezes, rales, or rhonchi. Breathing is unlabored.  Heart: Regular rate,  With normal  S1 S2. No murmurs, rubs, or gallops   Abdomen:  Soft, non-tender, non-distended with normoactive bowel sounds. No hepatomegaly. No rebound/guarding. No abdominal masses.  Extremities: No edema.  Distal pedal pulses are 2+ and equal bilaterally.  Neurologic: A&O X3, CN II - XII are grossly intact. Motor strength is 5/5 in the all 4 extremities.  Psych:  Responds to questions appropriately with normal affect.    Labs: BMET:  Basename 06/12/11 0541 06/11/11 0845  NA 142 137  K 3.5 4.6  CL 107 102  CO2 29 29  GLUCOSE 92 106*  BUN 18 21  CREATININE 0.53 0.43*  CALCIUM 8.8 9.1  MG -- --  PHOS -- --    Liver function tests:  Basename 06/11/11 0845  AST 24  ALT 11  ALKPHOS 72  BILITOT 0.4  PROT 7.2  ALBUMIN 3.6   No results found for this basename: LIPASE:2,AMYLASE:2 in the last 72 hours  CBC:  Basename 06/12/11 0541 06/11/11 0845  WBC 8.3 8.0  NEUTROABS -- 6.1  HGB 12.1 12.5  HCT 36.4 37.3  MCV 97.1 97.6  PLT 211 210    Cardiac Enzymes:  Basename 06/11/11 0843  CKTOTAL 104  CKMB 4.2*  TROPONINI <0.30    Coagulation Studies: No results found for this basename: LABPROT:5,INR:5 in the last 72 hours  Other: No components found with this basename: POCBNP:3  Basename 06/11/11 0845  DDIMER 1.28*   No results found for this basename: HGBA1C in the last 72 hours  Basename 06/12/11 0541  CHOL 102  HDL 53  LDLCALC 38  TRIG 56  CHOLHDL 1.9    Basename 06/11/11 1653  TSH 0.879  T4TOTAL --  T3FREE --  THYROIDAB --   No results found for this basename: VITAMINB12,FOLATE,FERRITIN,TIBC,IRON,RETICCTPCT in  the last 72 hours    Tele:  NSR  Medications:    Infusions:    . sodium chloride      Scheduled Medications:    . ferrous sulfate  325 mg Oral Q breakfast  . irbesartan  150 mg Oral Daily  . loratadine  10 mg Oral Daily  . metoprolol tartrate  25 mg Oral BID  . mulitivitamin with minerals  1 tablet Oral Daily  . nitrofurantoin (macrocrystal-monohydrate)  100 mg Oral Q12H  . simvastatin  20 mg Oral QHS  . sodium chloride  500 mL Intravenous Once    Assessment/ Plan:    1. Orthostatic hypotension: due to her age.  Will get echo this am. Agree with further decrease of BP meds.  HCTZ has been stopped.  Pt would like to go home.  If echo is ok and if she does well with ambulation, it  should be ok for her to go this afternoon.  Disposition: home today after 3 pm if she has no episodes of orthostasis. Length of Stay: 1  [name], Montez Hageman., MD, [facility] 06/12/2011, 8:25 AM

## 2011-06-13 LAB — URINE CULTURE
Colony Count: >=100000
Culture  Setup Time: 201305020145

## 2011-06-17 ENCOUNTER — Telehealth: Payer: Self-pay | Admitting: Cardiovascular Disease

## 2011-06-17 NOTE — Telephone Encounter (Signed)
New Problem:    Patient's daughter called in because her mother went to her OB/GYN toady and due to her high blood pressure reading had one of her medications changed today and they were wondering if this was ok.  Patient is also a little shakey. Please call back.

## 2011-06-17 NOTE — Telephone Encounter (Signed)
Dr Elease Hashimoto wants her to continue meds as is, wants her bp a bit higher due to orthostatic hypotension. Daughter was asked to get bp cuff/ ormron and to get daily bp, go back to ob/gyn in one week to f/u with uti. Call with further questions or concerns, she agreed to plan.

## 2011-06-27 ENCOUNTER — Ambulatory Visit (INDEPENDENT_AMBULATORY_CARE_PROVIDER_SITE_OTHER): Payer: Federal, State, Local not specified - PPO | Admitting: Cardiovascular Disease

## 2011-06-27 ENCOUNTER — Encounter: Payer: Self-pay | Admitting: Cardiovascular Disease

## 2011-06-27 VITALS — BP 163/80 | HR 75 | Ht 63.0 in | Wt 122.6 lb

## 2011-06-27 DIAGNOSIS — I10 Essential (primary) hypertension: Secondary | ICD-10-CM

## 2011-06-27 LAB — BASIC METABOLIC PANEL
BUN: 22 mg/dL (ref 6–23)
Calcium: 8.6 mg/dL (ref 8.4–10.5)
Chloride: 109 mEq/L (ref 96–112)
Creatinine, Ser: 0.5 mg/dL (ref 0.4–1.2)

## 2011-06-27 NOTE — Patient Instructions (Signed)
Your physician recommends that you return for lab work in: TODAY/ BMET AND IN THREE MONTHS  Your physician recommends that you schedule a follow-up appointment in: 3 MONTHS

## 2011-06-27 NOTE — Assessment & Plan Note (Addendum)
[name] feels quite a bit better today. She presented to the hospital a month ago with profound orthostatic hypotension. We've had cut several of her blood pressure medications. We now have established that she needs to be allowed to have a high blood pressure and she is lying or sitting. This is because her blood pressure falls fairly quickly and significantly when she stands up. We need to make sure that she has a standing blood pressure of greater than 110-120. She brought her blood pressure log with her today. Most readings are mildly elevated. She did not have any readings greater than 180. We'll continue with the current medications. We'll check a basic metabolic profile today.   I'll see her again in 3 months.  We'll check a basic metabolic profile again at that time.

## 2011-06-27 NOTE — Progress Notes (Signed)
[name] Date of Birth  1914-04-02       [facility]    [place] [address]  [address] [place], [place]  [address]   [place], [place]  [address] [phone]     [phone]   Fax  [phone]    Fax [phone]  Problem List: 1. Orthostatic Hypotension  - BP meds adjusted: HCTZ dc'd, Lopressor decreased during hospitalization April , 2013 2. Dysuria  - Urinalysis w/ (+) leukocytes, nitrite negative; urine culture pending  - Macrobid initiated on 5/1  Secondary Discharge Diagnoses:  1. Atrial Fibrillation  2. Hypertension  3. Mitral Valve Prolapse  4. H/o Rectal Cancer  5. Osteoporosis  6. Right Femoral neck fracture 2010  7. Left Wrist fracture   History of Present Illness:  She's done very well since she left the hospital. She was admitted in April with episodes of orthostatic hypotension. We made some medication adjustments including stopping her HCTZ and we decreased her Toprol dose.  We have had to cut her blood pressure medicines such that she has l high readings especially in the morning. This is because her drops her blood pressure dropped so quickly when she stands up.    Current Outpatient Prescriptions on File Prior to Visit  Medication Sig Dispense Refill  . ferrous sulfate (FEOSOL) 325 (65 FE) MG tablet Take 325 mg by mouth daily with breakfast.      . irbesartan (AVAPRO) 150 MG tablet Take 1 tablet (150 mg total) by mouth daily.  30 tablet  6  . loratadine (CLARITIN) 10 MG tablet Take 10 mg by mouth daily. allergies      . metoprolol (LOPRESSOR) 25 MG tablet Take 1 tablet (25 mg total) by mouth 2 (two) times daily.  60 tablet  6  . Multiple Vitamin (MULITIVITAMIN WITH MINERALS) TABS Take 1 tablet by mouth daily.      . simvastatin (ZOCOR) 20 MG tablet Take 1 tablet (20 mg total) by mouth at bedtime.  90 tablet  3    Allergies  Allergen Reactions  . Sulfa Drugs Cross Reactors     "I don't remember  what happens"    Past Medical History  Diagnosis Date  . Hypertension   . MVP (mitral valve prolapse)     Not mentioned on 08/2005 echo however  . OP (osteoporosis)   . History of rectal cancer   . Fracture of femoral neck, right 2010  . Wrist fracture     left  . Dizziness   . Pneumonia     "once"  . Blood transfusion     "my own blood"  . Headache     "terrible while going thru menopause; none since then"  . Arthritis   . Anxiety     "now & then; I get over it"; denies RX  . Syncope 06/10/11    "fell; I've been having these spells; off & on; last time was maybe 2 yr ago"  . Atrial fibrillation     08/2005, normal EF at that time  . Tachyarrhythmia 06/11/11    burst of ST    Past Surgical History  Procedure Date  . Transthoracic echocardiogram 08/20/2005    EF 60%  . Colonoscopy   . Total hip arthroplasty     bilaterally  . Inguinal hernia repair     right  . Breast cyst excision     right  . Cataract extraction w/ intraocular  lens  implant, bilateral   . Colectomy     History  Smoking status  . Never Smoker   Smokeless tobacco  . Never Used    History  Alcohol Use No    Family History  Problem Relation Age of Onset  . Heart disease Neg Hx     Reviw of Systems:  Reviewed in the HPI.  All other systems are negative.  Physical Exam: Blood pressure 163/80, pulse 75, height 5\' 3"  (1.6 m), weight 122 lb 9.6 oz (55.611 kg). General: Well developed, well nourished, in no acute distress.  Looks quite good for her age  Head: Normocephalic, atraumatic,  mucus membranes are moist,   Neck: Supple. Carotids are 2 + without bruits. No JVD  Lungs: Clear bilaterally to auscultation.  Heart: regular rate.  normal  S1 S2. No murmurs, gallops or rubs.  Abdomen: Soft, non-tender, non-distended with normal bowel sounds. No hepatomegaly. No rebound/guarding. No masses.  Msk:  Strength and tone are normal  Extremities: No clubbing or cyanosis. No edema.  Distal  pedal pulses are 2+ and equal bilaterally.  Neuro: Alert and oriented X 3. Moves all extremities spontaneously.  Psych:  Responds to questions appropriately with a normal affect.  ECG:  Assessment / Plan:

## 2011-08-29 ENCOUNTER — Ambulatory Visit: Payer: Federal, State, Local not specified - PPO | Admitting: Cardiovascular Disease

## 2011-09-29 ENCOUNTER — Ambulatory Visit (INDEPENDENT_AMBULATORY_CARE_PROVIDER_SITE_OTHER): Payer: Medicare Other | Admitting: Cardiovascular Disease

## 2011-09-29 ENCOUNTER — Encounter: Payer: Self-pay | Admitting: Cardiovascular Disease

## 2011-09-29 VITALS — BP 163/76 | HR 79 | Ht 63.0 in | Wt 118.0 lb

## 2011-09-29 DIAGNOSIS — I10 Essential (primary) hypertension: Secondary | ICD-10-CM

## 2011-09-29 LAB — BASIC METABOLIC PANEL
Calcium: 8.9 mg/dL (ref 8.4–10.5)
Glucose, Bld: 100 mg/dL — ABNORMAL HIGH (ref 70–99)
Potassium: 3.7 mEq/L (ref 3.5–5.1)
Sodium: 142 mEq/L (ref 135–145)

## 2011-09-29 LAB — HEPATIC FUNCTION PANEL
ALT: 10 U/L (ref 0–35)
Bilirubin, Direct: 0.1 mg/dL (ref 0.0–0.3)
Total Protein: 6.7 g/dL (ref 6.0–8.3)

## 2011-09-29 NOTE — Patient Instructions (Addendum)
Your physician recommends that you return for lab work in: TODAY//BMET LIVER  Your physician wants you to follow-up in: 6  MONTHS  You will receive a reminder letter in the mail two months in advance. If you don't receive a letter, please call our office to schedule the follow-up appointment.   Your physician recommends that you return for a FASTING lipid profile: 6 MONTHS

## 2011-09-29 NOTE — Assessment & Plan Note (Signed)
We'll continue with the Zocor. She's had lipids in May, 2013. We'll check a hepatic profile and basic metabolic profile today. We'll check a full set of fasting labs in 6 months.

## 2011-09-29 NOTE — Progress Notes (Signed)
[name] Date of Birth  06-21-1914       [facility]    [place] [address]  [address] [place], [place]  [address]   [place], [place]  [address] [phone]     [phone]   Fax  [phone]    Fax [phone]  Problem List: 1. Orthostatic Hypotension  - BP meds adjusted: HCTZ dc'd, Lopressor decreased during hospitalization April , 2013 2. Dysuria  - Urinalysis w/ (+) leukocytes, nitrite negative; urine culture pending  - Macrobid initiated on 5/1  Secondary Discharge Diagnoses:  1. Atrial Fibrillation  2. Hypertension  3. Mitral Valve Prolapse  4. H/o Rectal Cancer  5. Osteoporosis  6. Right Femoral neck fracture 2010  7. Left Wrist fracture   History of Present Illness:  She's done very well since she left the hospital. She was admitted in April with episodes of orthostatic hypotension. We made some medication adjustments including stopping her HCTZ and we decreased her Toprol dose.  We have had to cut her blood pressure medicines such that she has l high readings especially in the morning. This is because her drops her blood pressure dropped so quickly when she stands up.    She continues to be unsteady when she walks. She occasionally will walk with a cane or walker. We have encouraged her to use her cane and walker every time she goes out to walk.  Current Outpatient Prescriptions on File Prior to Visit  Medication Sig Dispense Refill  . ferrous sulfate (FEOSOL) 325 (65 FE) MG tablet Take 325 mg by mouth as needed.       . irbesartan (AVAPRO) 150 MG tablet Take 1 tablet (150 mg total) by mouth daily.  30 tablet  6  . loratadine (CLARITIN) 10 MG tablet Take 10 mg by mouth as needed. allergies      . metoprolol (LOPRESSOR) 25 MG tablet Take 1 tablet (25 mg total) by mouth 2 (two) times daily.  60 tablet  6  . Multiple Vitamin (MULITIVITAMIN WITH MINERALS) TABS Take 1 tablet by mouth daily.      . simvastatin  (ZOCOR) 20 MG tablet Take 1 tablet (20 mg total) by mouth at bedtime.  90 tablet  3    Allergies  Allergen Reactions  . Sulfa Drugs Cross Reactors     "I don't remember what happens"    Past Medical History  Diagnosis Date  . Hypertension   . MVP (mitral valve prolapse)     Not mentioned on 08/2005 echo however  . OP (osteoporosis)   . History of rectal cancer   . Fracture of femoral neck, right 2010  . Wrist fracture     left  . Dizziness   . Pneumonia     "once"  . Blood transfusion     "my own blood"  . Headache     "terrible while going thru menopause; none since then"  . Arthritis   . Anxiety     "now & then; I get over it"; denies RX  . Syncope 06/10/11    "fell; I've been having these spells; off & on; last time was maybe 2 yr ago"  . Atrial fibrillation     08/2005, normal EF at that time  . Tachyarrhythmia 06/11/11    burst of ST    Past Surgical History  Procedure Date  . Transthoracic echocardiogram 08/20/2005    EF 60%  . Colonoscopy   .  Total hip arthroplasty     bilaterally  . Inguinal hernia repair     right  . Breast cyst excision     right  . Cataract extraction w/ intraocular lens  implant, bilateral   . Colectomy     History  Smoking status  . Never Smoker   Smokeless tobacco  . Never Used    History  Alcohol Use No    Family History  Problem Relation Age of Onset  . Heart disease Neg Hx     Reviw of Systems:  Reviewed in the HPI.  All other systems are negative.  Physical Exam: Blood pressure 163/76, pulse 79, height 5\' 3"  (1.6 m), weight 118 lb (53.524 kg). General: Well developed, well nourished, in no acute distress.  Looks quite good for her age  Head: Normocephalic, atraumatic,  mucus membranes are moist,   Neck: Supple. Carotids are 2 + without bruits. No JVD  Lungs: Clear bilaterally to auscultation.  Heart: regular rate.  normal  S1 S2. No murmurs, gallops or rubs.  Abdomen: Soft, non-tender, non-distended with  normal bowel sounds. No hepatomegaly. No rebound/guarding. No masses.  Msk:  Strength and tone are normal  Extremities: No clubbing or cyanosis. No edema.  Distal pedal pulses are 2+ and equal bilaterally.  Neuro: Alert and oriented X 3. Moves all extremities spontaneously.  Psych:  Responds to questions appropriately with a normal affect.  ECG:  Assessment / Plan:

## 2011-09-29 NOTE — Assessment & Plan Note (Signed)
[name] seems to be doing fairly well. Her resting blood pressures little elevated today but we have concluded that we will have to allow her resting blood pressure to be a little high because she has such profound orthostasis when she stands up. She still has occasional episodes of mild orthostasis but not to the point where she's passing out.  We'll continue with her same medications. I'll see her again in 6 months.

## 2011-10-22 ENCOUNTER — Telehealth: Payer: Self-pay | Admitting: Cardiovascular Disease

## 2011-10-22 NOTE — Telephone Encounter (Signed)
New Problem:    Called in because she believes that the patient is having some side effects from the medication she is taking.  Please call back.

## 2011-10-22 NOTE — Telephone Encounter (Signed)
C/o nausea, hot flashes, wrist tingling bilateral and now current ear pressure x 3 days. Pt was  tearful with daughter on phone- just not feeling well. Pt feels its her medications, pt has held avapro and metoprolol today and one half dose yesterday because she is afraid it is causing the nausea. Pt denies SOB, CP, Dizziness. When asked, pt said she is urinating every 5 minutes. Reviewed chart and has HX UTI, Discussed with Norma Fredrickson NP, advise taking BP meds current bp 194/83 p 81 and go to walk in clinic for evaluation, all agreed to plan.

## 2011-10-27 ENCOUNTER — Telehealth: Payer: Self-pay | Admitting: Cardiovascular Disease

## 2011-10-27 NOTE — Telephone Encounter (Signed)
Patient's daughtrer aware of MD's recommendations.

## 2011-10-27 NOTE — Telephone Encounter (Signed)
Pt's daughter Jenel Lucks called because she said pt had a bad reaction to the BP medication Avapro 150 mg. Daughter said, yesterday pt was nauseated, head pressure tingling of arms, head pressure, flush face  pt was not able to eat because of the nauseas. Pt stop taken the medication yesterday, today she is feeling a lot better, and refused to continue taken that medication. Daughter said that the Metoprolol by itself is working fine. At the beginning pt was not taken the Metoprolol  medication correctly, now pt taken metoprolol at 6:30 AM and at 6:30 PM. Today pt's BP is 154/60 one hour after taken the Metoprolol.

## 2011-10-27 NOTE — Telephone Encounter (Signed)
It is OK with me if she holds her Avapro for now.  Continue metoprolol.  Will address at next ov.

## 2011-10-27 NOTE — Telephone Encounter (Signed)
Please return call to patient daughter [phone]  Pt appears to be having a bad reaction to irbesartan (AVAPRO) 150 MG tablet Anxiety, flush face, arm tingling, crying

## 2011-11-05 ENCOUNTER — Telehealth: Payer: Self-pay | Admitting: Cardiovascular Disease

## 2011-11-05 NOTE — Telephone Encounter (Signed)
Pt is feeling sick & doesn't want to eat and she wants to talk to you about this

## 2011-11-05 NOTE — Telephone Encounter (Signed)
Pt had uti earlier in month when she was feeling this way prior and was treated, abx  done 9/16. Pt was better but now is declining again with same sx. Pt didn't have f/u UA, advised to call pcp and explain sx and request UA, daughter agreed with plan.

## 2012-01-06 ENCOUNTER — Other Ambulatory Visit: Payer: Self-pay | Admitting: Obstetrics and Gynecology

## 2012-01-06 DIAGNOSIS — Z1231 Encounter for screening mammogram for malignant neoplasm of breast: Secondary | ICD-10-CM

## 2012-02-17 ENCOUNTER — Ambulatory Visit
Admission: RE | Admit: 2012-02-17 | Discharge: 2012-02-17 | Disposition: A | Discharge status: Home / Self Care | Payer: Federal, State, Local not specified - PPO | Source: Ambulatory Visit | Attending: Obstetrics and Gynecology | Admitting: Obstetrics and Gynecology

## 2012-02-17 DIAGNOSIS — Z1231 Encounter for screening mammogram for malignant neoplasm of breast: Secondary | ICD-10-CM

## 2012-05-25 ENCOUNTER — Ambulatory Visit (INDEPENDENT_AMBULATORY_CARE_PROVIDER_SITE_OTHER): Payer: Federal, State, Local not specified - PPO | Admitting: Cardiovascular Disease

## 2012-05-25 ENCOUNTER — Encounter: Payer: Self-pay | Admitting: Cardiovascular Disease

## 2012-05-25 VITALS — BP 172/84 | HR 63 | Ht 63.0 in | Wt 110.2 lb

## 2012-05-25 DIAGNOSIS — E785 Hyperlipidemia, unspecified: Secondary | ICD-10-CM

## 2012-05-25 DIAGNOSIS — I10 Essential (primary) hypertension: Secondary | ICD-10-CM

## 2012-05-25 NOTE — Patient Instructions (Addendum)
Your physician wants you to follow-up in: 1 YEAR.  You will receive a reminder letter in the mail two months in advance. If you don't receive a letter, please call our office to schedule the follow-up appointment.  Your physician recommends that you continue on your current medications as directed. Please refer to the Current Medication list given to you today.  

## 2012-05-25 NOTE — Progress Notes (Signed)
[name] Date of Birth  1915/01/02       [facility]    [place] [address]  [address] [place], [place]  [address]   [place], [place]  [address] [phone]     [phone]   Fax  [phone]    Fax [phone]  Problem List: 1. Orthostatic Hypotension  - BP meds adjusted: HCTZ dc'd, Lopressor decreased during hospitalization April , 2013 2. Dysuria  - Urinalysis w/ (+) leukocytes, nitrite negative; urine culture pending  - Macrobid initiated on 5/1  Secondary Discharge Diagnoses:  1. Atrial Fibrillation  2. Hypertension  3. Mitral Valve Prolapse  4. H/o Rectal Cancer  5. Osteoporosis  6. Right Femoral neck fracture 2010  7. Left Wrist fracture   History of Present Illness:  She's done very well since she left the hospital. She was admitted in April with episodes of orthostatic hypotension. We made some medication adjustments including stopping her HCTZ and we decreased her Toprol dose.  We have had to cut her blood pressure medicines such that she has l high readings especially in the morning. This is because her drops her blood pressure dropped so quickly when she stands up.    She continues to be unsteady when she walks. She occasionally will walk with a cane or walker. We have encouraged her to use her cane and walker every time she goes out to walk.  May 25, 2012:  [name] is doing well from a cardiac standpoint.  She has had lots of UTIs but these are better.    She has significant orthostasis.  Her BP is usually pretty good in the mornings - 140-150.     Current Outpatient Prescriptions on File Prior to Visit  Medication Sig Dispense Refill  . ferrous sulfate (FEOSOL) 325 (65 FE) MG tablet Take 325 mg by mouth as needed.       . loratadine (CLARITIN) 10 MG tablet Take 10 mg by mouth as needed. allergies      . metoprolol (LOPRESSOR) 25 MG tablet Take 1 tablet (25 mg total) by mouth 2 (two) times  daily.  60 tablet  6  . Multiple Vitamin (MULITIVITAMIN WITH MINERALS) TABS Take 1 tablet by mouth daily.      . simvastatin (ZOCOR) 20 MG tablet Take 1 tablet (20 mg total) by mouth at bedtime.  90 tablet  3   No current facility-administered medications on file prior to visit.    Allergies  Allergen Reactions  . Sulfa Drugs Cross Reactors     "I don't remember what happens"    Past Medical History  Diagnosis Date  . Hypertension   . MVP (mitral valve prolapse)     Not mentioned on 08/2005 echo however  . OP (osteoporosis)   . History of rectal cancer   . Fracture of femoral neck, right 2010  . Wrist fracture     left  . Dizziness   . Pneumonia     "once"  . Blood transfusion     "my own blood"  . Headache     "terrible while going thru menopause; none since then"  . Arthritis   . Anxiety     "now & then; I get over it"; denies RX  . Syncope 06/10/11    "fell; I've been having these spells; off & on; last time was maybe 2 yr ago"  . Atrial fibrillation     08/2005, normal  EF at that time  . Tachyarrhythmia 06/11/11    burst of ST    Past Surgical History  Procedure Laterality Date  . Transthoracic echocardiogram  08/20/2005    EF 60%  . Colonoscopy    . Total hip arthroplasty      bilaterally  . Inguinal hernia repair      right  . Breast cyst excision      right  . Cataract extraction w/ intraocular lens  implant, bilateral    . Colectomy      History  Smoking status  . Never Smoker   Smokeless tobacco  . Never Used    History  Alcohol Use No    Family History  Problem Relation Age of Onset  . Heart disease Neg Hx     Reviw of Systems:  Reviewed in the HPI.  All other systems are negative.  Physical Exam: Blood pressure 172/84, pulse 63, height 5\' 3"  (1.6 m), weight 110 lb 3.2 oz (49.986 kg). General: Well developed, well nourished, in no acute distress.  Looks quite good for her age  Head: Normocephalic, atraumatic,  mucus membranes are  moist,   Neck: Supple. Carotids are 2 + without bruits. No JVD  Lungs: Clear bilaterally to auscultation.  Heart: regular rate.  normal  S1 S2. No murmurs, gallops or rubs.  Abdomen: Soft, non-tender, non-distended with normal bowel sounds. No hepatomegaly. No rebound/guarding. No masses.  Msk:  Strength and tone are normal  Extremities: No clubbing or cyanosis. No edema.  Distal pedal pulses are 2+ and equal bilaterally.  Neuro: Alert and oriented X 3. Moves all extremities spontaneously.  Psych:  Responds to questions appropriately with a normal affect.  ECG: May 25, 2012:  NSR at 63, sinus arrhythmia, right and left atrial enlargment. LVH Assessment / Plan:

## 2012-05-25 NOTE — Assessment & Plan Note (Signed)
Mrs. Hanback is doing very well. She feels great. She still does all of her  normal activities.  Her blood pressure is a little bit high today but we allow her blood pressure to be slightly high on occasion because she has such profound orthostatic hypotension. Overall I think it she is doing quite well and I would recommend that we continue with her same medications.  I will see her again in one year. She continues to see her medical doctor Mayra Neer) regularly.

## 2012-06-11 ENCOUNTER — Other Ambulatory Visit: Payer: Self-pay | Admitting: *Deleted

## 2012-06-11 MED ORDER — METOPROLOL TARTRATE 25 MG PO TABS: 25.0000 mg | ORAL_TABLET | Freq: Two times a day (BID) | ORAL | Status: DC

## 2013-02-08 ENCOUNTER — Other Ambulatory Visit: Payer: Self-pay | Admitting: Cardiovascular Disease

## 2013-04-05 ENCOUNTER — Emergency Department (HOSPITAL_COMMUNITY): Payer: Medicare Other

## 2013-04-05 ENCOUNTER — Encounter (HOSPITAL_COMMUNITY): Payer: Self-pay | Admitting: Emergency Medicine

## 2013-04-05 ENCOUNTER — Inpatient Hospital Stay (HOSPITAL_COMMUNITY)
Admission: EM | Admit: 2013-04-05 | Discharge: 2013-04-09 | DRG: 552 | Disposition: A | Discharge status: Skilled Nursing Facility | Payer: Medicare Other | Attending: Internal Medicine | Admitting: Internal Medicine

## 2013-04-05 DIAGNOSIS — Y92009 Unspecified place in unspecified non-institutional (private) residence as the place of occurrence of the external cause: Secondary | ICD-10-CM

## 2013-04-05 DIAGNOSIS — I10 Essential (primary) hypertension: Secondary | ICD-10-CM | POA: Diagnosis present

## 2013-04-05 DIAGNOSIS — F411 Generalized anxiety disorder: Secondary | ICD-10-CM | POA: Diagnosis present

## 2013-04-05 DIAGNOSIS — M129 Arthropathy, unspecified: Secondary | ICD-10-CM | POA: Diagnosis present

## 2013-04-05 DIAGNOSIS — E785 Hyperlipidemia, unspecified: Secondary | ICD-10-CM

## 2013-04-05 DIAGNOSIS — W19XXXA Unspecified fall, initial encounter: Secondary | ICD-10-CM

## 2013-04-05 DIAGNOSIS — S32000A Wedge compression fracture of unspecified lumbar vertebra, initial encounter for closed fracture: Secondary | ICD-10-CM | POA: Diagnosis present

## 2013-04-05 DIAGNOSIS — I951 Orthostatic hypotension: Secondary | ICD-10-CM | POA: Diagnosis present

## 2013-04-05 DIAGNOSIS — Z85048 Personal history of other malignant neoplasm of rectum, rectosigmoid junction, and anus: Secondary | ICD-10-CM

## 2013-04-05 DIAGNOSIS — S322XXA Fracture of coccyx, initial encounter for closed fracture: Principal | ICD-10-CM

## 2013-04-05 DIAGNOSIS — E876 Hypokalemia: Secondary | ICD-10-CM | POA: Diagnosis present

## 2013-04-05 DIAGNOSIS — R55 Syncope and collapse: Secondary | ICD-10-CM

## 2013-04-05 DIAGNOSIS — Z96649 Presence of unspecified artificial hip joint: Secondary | ICD-10-CM

## 2013-04-05 DIAGNOSIS — S32009A Unspecified fracture of unspecified lumbar vertebra, initial encounter for closed fracture: Secondary | ICD-10-CM

## 2013-04-05 DIAGNOSIS — I059 Rheumatic mitral valve disease, unspecified: Secondary | ICD-10-CM | POA: Diagnosis present

## 2013-04-05 DIAGNOSIS — I4891 Unspecified atrial fibrillation: Secondary | ICD-10-CM | POA: Diagnosis present

## 2013-04-05 DIAGNOSIS — M810 Age-related osteoporosis without current pathological fracture: Secondary | ICD-10-CM | POA: Diagnosis present

## 2013-04-05 DIAGNOSIS — M25559 Pain in unspecified hip: Secondary | ICD-10-CM | POA: Diagnosis present

## 2013-04-05 DIAGNOSIS — W010XXA Fall on same level from slipping, tripping and stumbling without subsequent striking against object, initial encounter: Secondary | ICD-10-CM | POA: Diagnosis present

## 2013-04-05 DIAGNOSIS — S3210XA Unspecified fracture of sacrum, initial encounter for closed fracture: Principal | ICD-10-CM | POA: Diagnosis present

## 2013-04-05 LAB — CBC
HEMATOCRIT: 41.4 % (ref 36.0–46.0)
HEMOGLOBIN: 13.9 g/dL (ref 12.0–15.0)
MCH: 32.9 pg (ref 26.0–34.0)
MCHC: 33.6 g/dL (ref 30.0–36.0)
MCV: 97.9 fL (ref 78.0–100.0)
Platelets: 183 10*3/uL (ref 150–400)
RBC: 4.23 MIL/uL (ref 3.87–5.11)
RDW: 13.2 % (ref 11.5–15.5)
WBC: 11.8 10*3/uL — AB (ref 4.0–10.5)

## 2013-04-05 LAB — BASIC METABOLIC PANEL
BUN: 17 mg/dL (ref 6–23)
CHLORIDE: 103 meq/L (ref 96–112)
CO2: 26 mEq/L (ref 19–32)
CREATININE: 0.5 mg/dL (ref 0.50–1.10)
Calcium: 8.9 mg/dL (ref 8.4–10.5)
GFR calc Af Amer: >90 mL/min (ref >90)
GFR calc non Af Amer: 79 mL/min — ABNORMAL LOW (ref >90)
GLUCOSE: 111 mg/dL — AB (ref 70–99)
POTASSIUM: 3.9 meq/L (ref 3.7–5.3)
Sodium: 145 mEq/L (ref 137–147)

## 2013-04-05 LAB — URINALYSIS, ROUTINE W REFLEX MICROSCOPIC
BILIRUBIN URINE: NEGATIVE
GLUCOSE, UA: NEGATIVE mg/dL
KETONES UR: 15 mg/dL — AB
Nitrite: NEGATIVE
PH: 7 (ref 5.0–8.0)
Protein, ur: NEGATIVE mg/dL
SPECIFIC GRAVITY, URINE: 1.016 (ref 1.005–1.030)
Urobilinogen, UA: 0.2 mg/dL (ref 0.0–1.0)

## 2013-04-05 LAB — URINE MICROSCOPIC-ADD ON

## 2013-04-05 MED ORDER — METOPROLOL TARTRATE 25 MG PO TABS: 25.0000 mg | ORAL_TABLET | Freq: Once | ORAL | Status: DC

## 2013-04-05 MED ORDER — METOPROLOL TARTRATE 1 MG/ML IV SOLN: 5.0000 mg | Freq: Once | INTRAVENOUS | Status: DC

## 2013-04-05 MED ORDER — DOCUSATE SODIUM 100 MG PO CAPS
100.0000 mg | ORAL_CAPSULE | Freq: Two times a day (BID) | ORAL | Status: DC
  Administered 2013-04-05 – 2013-04-09 (×8): 100 mg via ORAL
  Filled 2013-04-05 (×9): qty 1

## 2013-04-05 MED ORDER — ONDANSETRON HCL 4 MG PO TABS: 4.0000 mg | ORAL_TABLET | Freq: Four times a day (QID) | ORAL | Status: DC | PRN

## 2013-04-05 MED ORDER — HYDRALAZINE HCL 20 MG/ML IJ SOLN: 5.0000 mg | Freq: Four times a day (QID) | INTRAMUSCULAR | Status: DC | PRN

## 2013-04-05 MED ORDER — HYDRALAZINE HCL 20 MG/ML IJ SOLN
5.0000 mg | Freq: Once | INTRAMUSCULAR | Status: DC
Start: 2013-04-05 — End: 2013-04-05

## 2013-04-05 MED ORDER — DILTIAZEM HCL 100 MG IV SOLR
5.0000 mg/h | INTRAVENOUS | Status: DC
  Administered 2013-04-05: 5 mg/h via INTRAVENOUS
  Filled 2013-04-05: qty 100

## 2013-04-05 MED ORDER — DILTIAZEM HCL 100 MG IV SOLR
5.0000 mg/h | INTRAVENOUS | Status: DC
  Administered 2013-04-06: 5 mg/h via INTRAVENOUS

## 2013-04-05 MED ORDER — ACETAMINOPHEN 650 MG RE SUPP: 650.0000 mg | Freq: Four times a day (QID) | RECTAL | Status: DC | PRN

## 2013-04-05 MED ORDER — MORPHINE SULFATE 2 MG/ML IJ SOLN: 2.0000 mg | INTRAMUSCULAR | Status: DC | PRN

## 2013-04-05 MED ORDER — ASPIRIN 81 MG PO CHEW
81.0000 mg | CHEWABLE_TABLET | Freq: Every day | ORAL | Status: DC
  Administered 2013-04-05 – 2013-04-09 (×5): 81 mg via ORAL
  Filled 2013-04-05 (×5): qty 1

## 2013-04-05 MED ORDER — ACETAMINOPHEN 325 MG PO TABS: 650.0000 mg | ORAL_TABLET | Freq: Four times a day (QID) | ORAL | Status: DC | PRN

## 2013-04-05 MED ORDER — ENOXAPARIN SODIUM 40 MG/0.4ML ~~LOC~~ SOLN
40.0000 mg | SUBCUTANEOUS | Status: DC
  Administered 2013-04-05 – 2013-04-08 (×4): 40 mg via SUBCUTANEOUS
  Filled 2013-04-05 (×5): qty 0.4

## 2013-04-05 MED ORDER — ONDANSETRON HCL 4 MG/2ML IJ SOLN: 4.0000 mg | Freq: Four times a day (QID) | INTRAMUSCULAR | Status: DC | PRN

## 2013-04-05 MED ORDER — HYDROCODONE-ACETAMINOPHEN 5-325 MG PO TABS: 1.0000 | ORAL_TABLET | ORAL | Status: DC | PRN

## 2013-04-05 MED ORDER — METOPROLOL TARTRATE 25 MG PO TABS
25.0000 mg | ORAL_TABLET | Freq: Two times a day (BID) | ORAL | Status: DC
  Administered 2013-04-05 – 2013-04-09 (×8): 25 mg via ORAL
  Filled 2013-04-05 (×10): qty 1

## 2013-04-05 NOTE — ED Provider Notes (Addendum)
CSN: [id number]     Arrival date & time 04/05/13  1257 History   First MD Initiated Contact with Patient 04/05/13 1403     Chief Complaint  Patient presents with  . Loss of Consciousness  . Fall  . Hip Pain  . Back Pain     (Consider location/radiation/quality/duration/timing/severity/associated sxs/prior Treatment) Patient is a 80 y.o. female presenting with fall.  Fall    Pt brought from home by family after having two falls since last night. She lives at home alone but has family over to the house frequently. She had her L leg give out on her last night and had a fall. Today while home alone, she called daughter to tell her she'd fallen again and was found supine on the floor with her walker on top of her. Complaining of headache which has since resolved, low back pain and L hip pain. She is unsure of LOC, but per daughter she called right after the fall and was acting normally. Family is concerned about her safety at home given increased falls.   Past Medical History  Diagnosis Date  . Hypertension   . MVP (mitral valve prolapse)     Not mentioned on 08/2005 echo however  . OP (osteoporosis)   . History of rectal cancer   . Fracture of femoral neck, right 2010  . Wrist fracture     left  . Dizziness   . Pneumonia     "once"  . Blood transfusion     "my own blood"  . Headache(784.0)     "terrible while going thru menopause; none since then"  . Arthritis   . Anxiety     "now & then; I get over it"; denies RX  . Syncope 06/10/11    "fell; I've been having these spells; off & on; last time was maybe 2 yr ago"  . Atrial fibrillation     08/2005, normal EF at that time  . Tachyarrhythmia 06/11/11    burst of ST   Past Surgical History  Procedure Laterality Date  . Transthoracic echocardiogram  08/20/2005    EF 60%  . Colonoscopy    . Total hip arthroplasty      bilaterally  . Inguinal hernia repair      right  . Breast cyst excision      right  . Cataract extraction  w/ intraocular lens  implant, bilateral    . Colectomy     Family History  Problem Relation Age of Onset  . Heart disease Neg Hx    History  Substance Use Topics  . Smoking status: Never Smoker   . Smokeless tobacco: Never Used  . Alcohol Use: No   OB History   Grav Para Term Preterm Abortions TAB SAB Ect Mult Living                 Review of Systems All other systems reviewed and are negative except as noted in HPI.     Allergies  Sulfa drugs cross reactors  Home Medications   Current Outpatient Rx  Name  Route  Sig  Dispense  Refill  . Cranberry 125 MG TABS   Oral   Take 1 tablet by mouth 2 (two) times daily.         . ferrous sulfate (FEOSOL) 325 (65 FE) MG tablet   Oral   Take 325 mg by mouth as needed.          . loratadine (CLARITIN) 10  MG tablet   Oral   Take 10 mg by mouth as needed. allergies         . metoprolol tartrate (LOPRESSOR) 25 MG tablet      TAKE 1 TABLET BY MOUTH TWICE DAILY   180 tablet   0     **Patient requests 90 days supply**   . Multiple Vitamin (MULITIVITAMIN WITH MINERALS) TABS   Oral   Take 1 tablet by mouth daily.         . simvastatin (ZOCOR) 20 MG tablet   Oral   Take 1 tablet (20 mg total) by mouth at bedtime.   90 tablet   3    BP 204/107  Pulse 77  Temp(Src) 97.4 F (36.3 C) (Oral)  Resp 20  SpO2 95% Physical Exam  Nursing note and vitals reviewed. Constitutional: She is oriented to person, place, and time. She appears well-developed and well-nourished.  HENT:  Head: Normocephalic and atraumatic.  Eyes: EOM are normal. Pupils are equal, round, and reactive to light.  Neck: Normal range of motion. Neck supple.  Cardiovascular: Normal rate, normal heart sounds and intact distal pulses.   Pulmonary/Chest: Effort normal and breath sounds normal.  Abdominal: Bowel sounds are normal. She exhibits no distension. There is no tenderness.  Musculoskeletal: Normal range of motion. She exhibits no edema and no  tenderness.  Mild tenderness over the lower L spine  Neurological: She is alert and oriented to person, place, and time. She has normal strength. No cranial nerve deficit or sensory deficit.  Skin: Skin is warm and dry. No rash noted.  Psychiatric: She has a normal mood and affect.    ED Course  Procedures (including critical care time) Labs Review Labs Reviewed  CBC - Abnormal; Notable for the following:    WBC 11.8 (*)    All other components within normal limits  BASIC METABOLIC PANEL - Abnormal; Notable for the following:    Glucose, Bld 111 (*)    GFR calc non Af Amer 79 (*)    All other components within normal limits  URINALYSIS, ROUTINE W REFLEX MICROSCOPIC - Abnormal; Notable for the following:    APPearance CLOUDY (*)    Hgb urine dipstick SMALL (*)    Ketones, ur 15 (*)    Leukocytes, UA SMALL (*)    All other components within normal limits  URINE MICROSCOPIC-ADD ON   Imaging Review Dg Lumbar Spine 2-3 Views  04/05/2013   CLINICAL DATA:  Low back pain status post fall  EXAM: LUMBAR SPINE - 2-3 VIEW  COMPARISON:  No previous studies back to 2007 were found.  FINDINGS: There is high-grade compression of the body of L1 that is of uncertain age. The loss of height anteriorly is approximately 60% and posteriorly approximately 30%. There is no definite retropulsion of bone. The other vertebral bodies appear preserved in height. There is disc space narrowing at L4-5 with grade 1 anterolisthesis of L4 with respect to L5. There is facet joint degenerative change at L4-5 and at L5-S1. As best as can be determined the pedicles and transverse processes are intact. The observed portions of the sacrum are grossly normal.  IMPRESSION: 1. There is high-grade compression of the body of L1 that is of uncertain age. 2. There is degenerative disc and facet joint change at L4-5 and grade 1 anterolisthesis. 3. There is diffuse osteopenia.   Electronically Signed   By: David  [place]   On: 04/05/2013  15:53   Dg Hip  Complete Left  04/05/2013   CLINICAL DATA:  Left hip pain status post fall, history of previous bilateral hip joint replacement  EXAM: LEFT HIP - COMPLETE 2+ VIEW  COMPARISON:  None.  FINDINGS: The bony pelvis is osteopenic. No acute pelvic fracture is demonstrated. 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 AP and frog-leg lateral views of the prosthetic left hip joint reveal no abnormality of the prosthesis. The interface with the native bone appears normal.  IMPRESSION: There is no acute bony abnormality of the left hip. The observed portions of the bony pelvis exhibit no acute abnormality.   Electronically Signed   By: David  [place]   On: 04/05/2013 15:49   Ct Head Wo Contrast  04/05/2013   CLINICAL DATA:  Several falls.  EXAM: CT HEAD WITHOUT CONTRAST  TECHNIQUE: Contiguous axial images were obtained from the base of the skull through the vertex without intravenous contrast.  COMPARISON:  06/11/2011.  FINDINGS: No skull fracture or intracranial hemorrhage.  Calcification posterior left frontal lobe unchanged.  Prominent small vessel disease type changes without CT evidence of large acute infarct.  No intracranial mass lesion noted on this unenhanced exam.  Opacification right maxillary sinus with hyperdense material centrally without change.  Vascular calcifications.  IMPRESSION: No skull fracture or intracranial hemorrhage.  Calcification posterior left frontal lobe unchanged most likely related to remote insult.  Prominent small vessel disease type changes without CT evidence of large acute infarct.  No intracranial mass lesion noted on this unenhanced exam.  Opacification right maxillary sinus with hyperdense material centrally without change.   Electronically Signed   By: Bridgett LarssonSteve  Olson M.D.   On: 04/05/2013 15:36    EKG Interpretation    Date/Time:  Tuesday April 05 2013 13:08:46 EST Ventricular Rate:  74 PR Interval:  166 QRS Duration: 84 QT Interval:  418 QTC  Calculation: 463 R Axis:   35 Text Interpretation:  Sinus rhythm with marked sinus arrhythmia Minimal voltage criteria for LVH, may be normal variant Borderline ECG No significant change since last tracing Confirmed by Deren Degrazia  MD, Kitzia Camus [phone]([id number]) on 04/05/2013 2:04:48 PM            MDM   Final diagnoses:  Fall  Lumbar compression fracture  Atrial fibrillation    Reviewed labs and imaging results. Pt has compression fx which corresponds to her pain from fall today. She is relatively pain free while lying still in bed, but unable to move much or stand due to pain. Given concerns for safety at home and for pain management, will admit to hospital.     Bonnita Levanharles B. Bernette MayersSheldon, MD 04/05/13 1713  Addendum: Prior to going upstairs for admission, the patient had change in heart rhythm to Afib with RVR, HR 130-140. Spoke again with Dr. Arbutus Leasat who asks for Cardizem drip to be initiated.   Donne Robillard B. Bernette MayersSheldon, MD 04/05/13 [phone]

## 2013-04-05 NOTE — ED Notes (Signed)
Pt returned from radiology. Phlebotomy at bedside. 

## 2013-04-05 NOTE — ED Notes (Signed)
Attempted to call report. RN to call back. 

## 2013-04-05 NOTE — ED Notes (Signed)
Patient transported to CT 

## 2013-04-05 NOTE — Progress Notes (Signed)
Spoke to charge RN in ED: [facility] cannot take a patient on a cardizem drip.

## 2013-04-05 NOTE — ED Notes (Signed)
Pt reports she may have passed out and fell.  Pt fell yesterday too.  Pt is complaining of lower back pain, left hip/thigh pain, and states she hit her head

## 2013-04-05 NOTE — ED Notes (Signed)
Yellow Bracelet and socks placed on the pt.

## 2013-04-05 NOTE — ED Notes (Signed)
Verified Cardizem bolus and continuous dose with RN.

## 2013-04-05 NOTE — H&P (Addendum)
[facility] History and Physical  [name]:[id number] DOB: Feb 21, 1914 DOA: 04/05/2013   PCP: Egbert Garibaldi, NP    Chief Complaint: Mechanical fall with back pain  HPI:  80 year old female with a history of hypertension, rectal cancer presented after 2 mechanical falls today. The patient was walking to the kitchen for lunch using her walker when she slipped and fell onto the floor hitting her left side. The patient also had her head. She did not lose consciousness. The patient had significant back pain after the fall. She was not able to get up. She called her son with her telephone that was around her neck. EMS was activated. The patient has been in her usual state of health prior to the mechanical fall. She denies any fevers, chills, chest discomfort, shortness breath, nausea, vomiting, diarrhea, abdominal pain, dysuria, hematuria. The patient has had nocturia. The patient denies any headaches or dizziness. She usually tablets with a walker. She lives alone, but her family comes to check on on her frequently.  In ED, the patient continued to have significant pain with minimal movement of her lower back. CT of the brain was negative for any acute findings. X-ray of the lumbar spine showed compression fracture of L1, uncertain age. Left hip x-ray was negative. EKG shows sinus rhythm without ST-T wave changes. WBC was noted to be: 0.8. Serum sodium was 145. Urinalysis was negative for any pyuria.  After I finished evaluating the patient, I was contacted by the ED physician who informed me that the patient converted to atrial fibrillation with RVR, heart rate 130s. The patient is hemodynamically stable. I recommended starting the patient on diltiazem drip as the patient has had a history of atrial fibrillation in the past. Review of the medical record reveals the patient has had a previous history of atrial fibrillation as well as orthostatic hypotension. I reevaluated the patient at  this time and she denied any chest discomfort, dizziness, shortness of breath, nausea. Assessment/Plan: Atrial fibrillation with RVR -CHADSVASc = 4 -start ASA -with pt's falls, hesitate to start full anticoagulation -Echocardiogram -TSH -start diltiazem drip  Lumbar compression fracture -Secondary to mechanical fall -Uncontrolled pain -Order PT/OT--may need SNF -Urinalysis negative -opioids prn pain -if no improvement, may need to consider kyphoplasty -straight leg raise negative; no bowel or bladder incontinence Hypertension -Uncontrolled partly due to the patient's pain -Continue metoprolol tartrate -Hydralazine when necessary sbp >180 Hx bilateral hip arthroplasty -stable       Past Medical History  Diagnosis Date  . Hypertension   . MVP (mitral valve prolapse)     Not mentioned on 08/2005 echo however  . OP (osteoporosis)   . History of rectal cancer   . Fracture of femoral neck, right 2010  . Wrist fracture     left  . Dizziness   . Pneumonia     "once"  . Blood transfusion     "my own blood"  . Headache(784.0)     "terrible while going thru menopause; none since then"  . Arthritis   . Anxiety     "now & then; I get over it"; denies RX  . Syncope 06/10/11    "fell; I've been having these spells; off & on; last time was maybe 2 yr ago"  . Atrial fibrillation     08/2005, normal EF at that time  . Tachyarrhythmia 06/11/11    burst of ST   Past Surgical History  Procedure Laterality Date  . Transthoracic echocardiogram  08/20/2005    EF 60%  . Colonoscopy    . Total hip arthroplasty      bilaterally  . Inguinal hernia repair      right  . Breast cyst excision      right  . Cataract extraction w/ intraocular lens  implant, bilateral    . Colectomy     Social History:  reports that she has never smoked. She has never used smokeless tobacco. She reports that she does not drink alcohol or use illicit drugs.   Family History  Problem Relation Age of  Onset  . Heart disease Neg Hx      Allergies  Allergen Reactions  . Irbesartan Other (See Comments)    Caused heart to race  . Sulfa Drugs Cross Reactors Hives and Other (See Comments)    Almost passed out      Prior to Admission medications   Medication Sig Start Date End Date Taking? Authorizing Provider  acetaminophen (TYLENOL) 500 MG tablet Take 500 mg by mouth daily as needed (pain).   Yes Historical Provider, MD  CRANBERRY PO Take 1 tablet by mouth daily.   Yes Historical Provider, MD  metoprolol tartrate (LOPRESSOR) 25 MG tablet Take 25 mg by mouth 2 (two) times daily.   Yes Historical Provider, MD  Naphazoline HCl (CLEAR EYES OP) Place 1 drop into both eyes at bedtime as needed (dry eyes/ itching).   Yes Historical Provider, MD    Review of Systems:  Constitutional:  No weight loss, night sweats, Fevers, chills, fatigue.  Head&Eyes: No headache.  No vision loss.  No eye pain or scotoma ENT:  No Difficulty swallowing,Tooth/dental problems,Sore throat,  No ear ache, post nasal drip,  Cardio-vascular:  No chest pain, Orthopnea, PND, swelling in lower extremities,  dizziness, palpitations  GI:  No  abdominal pain, nausea, vomiting, diarrhea, loss of appetite, hematochezia, melena, heartburn, indigestion, Resp:  No shortness of breath with exertion or at rest. No cough. No coughing up of blood .No wheezing.No chest wall deformity  Skin:  no rash or lesions.  GU:  no dysuria, change in color of urine, no urgency or frequency. No flank pain.  Musculoskeletal:  Complains of back pain and left hip pain. Psych:  No change in mood or affect. No depression or anxiety. Neurologic: No headache, no dysesthesia, no focal weakness, no vision loss. No syncope  Physical Exam: Filed Vitals:   04/05/13 1306 04/05/13 1421 04/05/13 1422 04/05/13 1630  BP: 204/107  204/75 180/80  Pulse: 77 71 76 81  Temp: 97.4 F (36.3 C)     TempSrc: Oral     Resp: 20 18 20 19   SpO2: 95% 95%  94% 97%   General:  A&O x 3, NAD, nontoxic, pleasant/cooperative Head/Eye: No conjunctival hemorrhage, no icterus, [place]/AT, No nystagmus ENT:  No icterus,  No thrush,  no pharyngeal exudate Neck:  No masses, no lymphadenpathy CV:  IRRR, no rub, no gallop, no S3 Lung:  CTAB, good air movement, no wheeze, no rhonchi Abdomen: soft/NT, +BS, nondistended, no peritoneal signs Ext: No cyanosis, No rashes, No petechiae, No lymphangitis, No edema Neuro: CNII-XII intact, strength 4/5 in bilateral upper and lower extremities, no dysmetria; straight leg raise test is negative. Sensation intact bilateral upper and lower extremities. Posterior tibial and dorsalis pedis pulses intact.   Labs on Admission:  Basic Metabolic Panel:  Recent Labs Lab 04/05/13 1600  NA 145  K 3.9  CL 103  CO2 26  GLUCOSE 111*  BUN 17  CREATININE 0.50  CALCIUM 8.9   Liver Function Tests: No results found for this basename: AST, ALT, ALKPHOS, BILITOT, PROT, ALBUMIN,  in the last 168 hours No results found for this basename: LIPASE, AMYLASE,  in the last 168 hours No results found for this basename: AMMONIA,  in the last 168 hours CBC:  Recent Labs Lab 04/05/13 1600  WBC 11.8*  HGB 13.9  HCT 41.4  MCV 97.9  PLT 183   Cardiac Enzymes: No results found for this basename: CKTOTAL, CKMB, CKMBINDEX, TROPONINI,  in the last 168 hours BNP: No components found with this basename: POCBNP,  CBG: No results found for this basename: GLUCAP,  in the last 168 hours  Radiological Exams on Admission: Dg Lumbar Spine 2-3 Views  04/05/2013   CLINICAL DATA:  Low back pain status post fall  EXAM: LUMBAR SPINE - 2-3 VIEW  COMPARISON:  No previous studies back to 2007 were found.  FINDINGS: There is high-grade compression of the body of L1 that is of uncertain age. The loss of height anteriorly is approximately 60% and posteriorly approximately 30%. There is no definite retropulsion of bone. The other vertebral bodies appear  preserved in height. There is disc space narrowing at L4-5 with grade 1 anterolisthesis of L4 with respect to L5. There is facet joint degenerative change at L4-5 and at L5-S1. As best as can be determined the pedicles and transverse processes are intact. The observed portions of the sacrum are grossly normal.  IMPRESSION: 1. There is high-grade compression of the body of L1 that is of uncertain age. 2. There is degenerative disc and facet joint change at L4-5 and grade 1 anterolisthesis. 3. There is diffuse osteopenia.   Electronically Signed   By: Jenesa Foresta  [place]   On: 04/05/2013 15:53   Dg Hip Complete Left  04/05/2013   CLINICAL DATA:  Left hip pain status post fall, history of previous bilateral hip joint replacement  EXAM: LEFT HIP - COMPLETE 2+ VIEW  COMPARISON:  None.  FINDINGS: The bony pelvis is osteopenic. No acute pelvic fracture is demonstrated. 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 AP and frog-leg lateral views of the prosthetic left hip joint reveal no abnormality of the prosthesis. The interface with the native bone appears normal.  IMPRESSION: There is no acute bony abnormality of the left hip. The observed portions of the bony pelvis exhibit no acute abnormality.   Electronically Signed   By: Arwyn Besaw  [place]   On: 04/05/2013 15:49   Ct Head Wo Contrast  04/05/2013   CLINICAL DATA:  Several falls.  EXAM: CT HEAD WITHOUT CONTRAST  TECHNIQUE: Contiguous axial images were obtained from the base of the skull through the vertex without intravenous contrast.  COMPARISON:  06/11/2011.  FINDINGS: No skull fracture or intracranial hemorrhage.  Calcification posterior left frontal lobe unchanged.  Prominent small vessel disease type changes without CT evidence of large acute infarct.  No intracranial mass lesion noted on this unenhanced exam.  Opacification right maxillary sinus with hyperdense material centrally without change.  Vascular calcifications.  IMPRESSION: No skull fracture or  intracranial hemorrhage.  Calcification posterior left frontal lobe unchanged most likely related to remote insult.  Prominent small vessel disease type changes without CT evidence of large acute infarct.  No intracranial mass lesion noted on this unenhanced exam.  Opacification right maxillary sinus with hyperdense material centrally without change.   Electronically Signed   By: Bridgett Larsson M.D.   On: 04/05/2013 15:36  EKG: Independently reviewed. Sinus rhythm, no ST-T wave change--before conversion to AFib    Time spent:70 minutes Code Status:   FULL Family Communication:   Daughter at bedside   Duran Ohern, DO  [facility] Pager [phone]  If 7PM-7AM, please contact night-coverage [email or web address] Password [facility] 04/05/2013, 5:43 PM

## 2013-04-06 ENCOUNTER — Inpatient Hospital Stay (HOSPITAL_COMMUNITY): Payer: Medicare Other

## 2013-04-06 DIAGNOSIS — Y92009 Unspecified place in unspecified non-institutional (private) residence as the place of occurrence of the external cause: Secondary | ICD-10-CM

## 2013-04-06 DIAGNOSIS — E876 Hypokalemia: Secondary | ICD-10-CM | POA: Diagnosis present

## 2013-04-06 DIAGNOSIS — I4891 Unspecified atrial fibrillation: Secondary | ICD-10-CM

## 2013-04-06 DIAGNOSIS — W19XXXA Unspecified fall, initial encounter: Secondary | ICD-10-CM

## 2013-04-06 LAB — BASIC METABOLIC PANEL
BUN: 14 mg/dL (ref 6–23)
CHLORIDE: 105 meq/L (ref 96–112)
CO2: 25 mEq/L (ref 19–32)
Calcium: 8.5 mg/dL (ref 8.4–10.5)
Creatinine, Ser: 0.47 mg/dL — ABNORMAL LOW (ref 0.50–1.10)
GFR calc non Af Amer: 80 mL/min — ABNORMAL LOW (ref >90)
Glucose, Bld: 104 mg/dL — ABNORMAL HIGH (ref 70–99)
POTASSIUM: 3.5 meq/L — AB (ref 3.7–5.3)
Sodium: 144 mEq/L (ref 137–147)

## 2013-04-06 LAB — CBC
HCT: 41.2 % (ref 36.0–46.0)
Hemoglobin: 13.9 g/dL (ref 12.0–15.0)
MCH: 32.6 pg (ref 26.0–34.0)
MCHC: 33.7 g/dL (ref 30.0–36.0)
MCV: 96.7 fL (ref 78.0–100.0)
PLATELETS: 190 10*3/uL (ref 150–400)
RBC: 4.26 MIL/uL (ref 3.87–5.11)
RDW: 13 % (ref 11.5–15.5)
WBC: 9.1 10*3/uL (ref 4.0–10.5)

## 2013-04-06 LAB — MRSA PCR SCREENING: MRSA by PCR: POSITIVE — AB

## 2013-04-06 LAB — TSH: TSH: 1.47 u[IU]/mL (ref 0.350–4.500)

## 2013-04-06 MED ORDER — BOOST PLUS PO LIQD
237.0000 mL | Freq: Every day | ORAL | Status: DC
  Administered 2013-04-07 – 2013-04-08 (×2): 237 mL via ORAL
  Filled 2013-04-06 (×5): qty 237

## 2013-04-06 MED ORDER — MUPIROCIN 2 % EX OINT
1.0000 "application " | TOPICAL_OINTMENT | Freq: Two times a day (BID) | CUTANEOUS | Status: DC
  Administered 2013-04-06 – 2013-04-09 (×6): 1 via NASAL
  Filled 2013-04-06: qty 22

## 2013-04-06 MED ORDER — WHITE PETROLATUM GEL
Status: AC
  Filled 2013-04-06: qty 5

## 2013-04-06 MED ORDER — POTASSIUM CHLORIDE CRYS ER 20 MEQ PO TBCR
40.0000 meq | EXTENDED_RELEASE_TABLET | Freq: Once | ORAL | Status: AC
  Administered 2013-04-06: 40 meq via ORAL
  Filled 2013-04-06: qty 2

## 2013-04-06 MED ORDER — CHLORHEXIDINE GLUCONATE CLOTH 2 % EX PADS
6.0000 | MEDICATED_PAD | Freq: Every day | CUTANEOUS | Status: DC
  Administered 2013-04-07 – 2013-04-09 (×3): 6 via TOPICAL

## 2013-04-06 NOTE — Progress Notes (Signed)
Echo Lab  2D Echocardiogram completed.  Annell Canty L Lashaun Krapf, RDCS 04/06/2013 2:14 PM

## 2013-04-06 NOTE — Progress Notes (Signed)
Received patient from 3[facility] this afternoon around 1445.  On telemetry patient now in SR.  Spoke with central monitors and they stated patient converted to SR around 1330.  Junious SilkAllison Ellis NP paged and notified of rhythm change.  EKG obtained and confirmed.  Will continue to monitor.  Colman Caterarpley, Erendira Crabtree Danielle

## 2013-04-06 NOTE — Progress Notes (Signed)
Nutrition Brief Note  Patient identified on the Malnutrition Screening Tool (MST) Report  Wt Readings from Last 15 Encounters:  04/05/13 120 lb 13 oz (54.8 kg)  05/25/12 110 lb 3.2 oz (49.986 kg)  09/29/11 118 lb (53.524 kg)  06/27/11 122 lb 9.6 oz (55.611 kg)  06/12/11 120 lb 5.9 oz (54.6 kg)  05/29/11 125 lb 6.4 oz (56.881 kg)  11/20/10 126 lb 6.4 oz (57.335 kg)    Body mass index is 21.41 kg/(m^2). Patient meets criteria for normal weight based on current BMI.   Current diet order is heart healthy, patient is consuming approximately 100% of meals at this time. Labs and medications reviewed. Potassium low, getting oral replacement. Pt with history of hypertension, rectal cancer presented after 2 mechanical falls yesterday.   Met with pt and daughter who report pt eating well PTA, lots of fruits, vegetables, chicken, and cereal with good appetite. Drinks 1 Boost/day. Both report pt with stable weight. Deny any problems chewing/swallowing. Pt reports eating 100% of meals. Will resume daily Boost. No further nutrition interventions warranted at this time. If nutrition issues arise, please consult RD.   Mikey College MS, [place], [place] Pager [phone] After Hours Pager

## 2013-04-06 NOTE — Progress Notes (Signed)
Pt had 4.47 sec pause. Cardizem gtt d/c'd. NP notified. No new orders received.

## 2013-04-06 NOTE — Progress Notes (Signed)
Utilization review completed.  

## 2013-04-06 NOTE — Care Management Note (Signed)
    Page 1 of 1   04/06/2013     4:01:37 PM   CARE MANAGEMENT NOTE 04/06/2013  Patient:  [name],[name]   Account Number:  [id number]  Date Initiated:  04/06/2013  Documentation initiated by:  GRAVES-BIGELOW,Lancelot Alyea  Subjective/Objective Assessment:   Pt admitted for afib- initiated on cardizem gtt. Pt is from home alone. Has support of daughter.     Action/Plan:   CM did speak to pt and daughter and both are agreeable ot SNF placement. CSW did discuss with pt SNF options. CM did provide pt with Personal Care agency list as well. No further needs from CM at this time.   Anticipated DC Date:  04/08/2013   Anticipated DC Plan:  SKILLED NURSING FACILITY  In-house referral  Clinical Social Worker      DC Planning Services  CM consult      Choice offered to / List presented to:             Status of service:  Completed, signed off Medicare Important Message given?   (If response is "NO", the following Medicare IM given date fields will be blank) Date Medicare IM given:   Date Additional Medicare IM given:    Discharge Disposition:  SKILLED NURSING FACILITY  Per UR Regulation:  Reviewed for med. necessity/level of care/duration of stay  If discussed at Long Length of Stay Meetings, dates discussed:    Comments:

## 2013-04-06 NOTE — Progress Notes (Addendum)
Report given to Maralyn SagoSarah on [facility],all questions answered. [name], Twinkle Sockwell D 2D Echo being done. Pt w/o complaints.

## 2013-04-06 NOTE — Progress Notes (Signed)
[name]  Amber PressmanRuth T Berry WUJ:[id number] DOB: 10-30-1914 DOA: 04/05/2013 PCP: Egbert GaribaldiMillsaps, [name], NP  Brief narrative: 80 year old female patient with history of hypertension as well as recurrent orthostatic hypotension requiring frequent adjustments in medications over the past several years. She also has a remote history of rectal cancer. She presented to the emergency department after falling x2 at her home. After the second fall she developed significant back pain and presented to the emergency department for treatment. The fall was described as mechanical in nature. The patient slipped while walking with her walker- she had no loss of consciousness although she did hit her head. Because of the significant pain she was unable to get up but she did have access to a phone, called her son and her family notified EMS. CT of the head was negative for acute findings, plain x-rays revealed compression fracture L1 of uncertain age. Left hip x-ray was negative. EKG was negative. No evidence of any acute infectious process. While in the emergency department and just after the admitting physician had finished examining the patient she developed atrial fibrillation with RVR with heart rates into the 130s but was hemodynamically stable. Cardizem drip was initiated.  Assessment/Plan:    Chronic Atrial fibrillation with acute RVR -Remains in atrial fibrillation but rate controlled -Cardizem infusion stopped due to 4 second pause overnight -Continue home Lopressor -Because of recurrent falls is not a candidate for anticoagulation    Lumbar compression fracture/ Fall at home -Treat pain -PT/OT evaluation -May require supportive back brace -Patient's daughter at bedside. States patient has been having difficulty with balance secondary to her left leg giving way when she stands - proceed with MRI of the lumbar sacral spine to better clarify    Hypokalemia -Replete  and follow lytes    HTN  -Continue low-dose Lopressor - see below discussion     Recurrent Orthostasis -Cautious mobilization after transitions from supine to standing - permissive HTN  DVT prophylaxis: Lovenox Code Status: Full Family Communication: No family at bedside Disposition Plan/Expected LOS: Transfer to telemetry - PT recommends 24/7 care at home with home health PT versus short stay for rehabilitation and skilled nursing facility. Patient and daughter would like to see if qualifies for CIR-OT evaluation pending  Consultants: None  Procedures: None  Antibiotics: None  HPI/Subjective: Patient alert and sitting on side of bed. Continues to endorse back discomfort while lying in the bed and with attempts to mobilize. No awareness of tachypalpitations. No current dizziness upon standing.  Objective: Blood pressure 149/96, pulse 69, temperature 98.5 F (36.9 C), temperature source Oral, resp. rate 19, height 5\' 3"  (1.6 m), weight 120 lb 13 oz (54.8 kg), SpO2 97.00%.  Intake/Output Summary (Last 24 hours) at 04/06/13 1339 Last data filed at 04/06/13 0300  Gross per 24 hour  Intake     45 ml  Output    300 ml  Net   -255 ml   Exam: General: No acute respiratory distress Lungs: Clear to auscultation bilaterally without wheezes or crackles, RA Cardiovascular: Irregular rate and rhythm without murmur gallop or rub normal S1 and S2, no peripheral edema or JVD Abdomen: Nontender, nondistended, soft, bowel sounds positive, no rebound, no ascites, no appreciable mass Musculoskeletal: No significant cyanosis, clubbing of bilateral lower extremities Neurological: Alert and oriented x 3, moves all extremities x 4 without focal neurological deficits, CN 2-12 intact  Scheduled Meds:  Scheduled Meds: . aspirin  81 mg  Oral Daily  . docusate sodium  100 mg Oral BID  . enoxaparin (LOVENOX) injection  40 mg Subcutaneous Q24H  . metoprolol tartrate  25 mg Oral BID  . white  petrolatum        Data Reviewed: Basic Metabolic Panel:  Recent Labs Lab 04/05/13 1600 04/06/13 0820  NA 145 144  K 3.9 3.5*  CL 103 105  CO2 26 25  GLUCOSE 111* 104*  BUN 17 14  CREATININE 0.50 0.47*  CALCIUM 8.9 8.5   Liver Function Tests: No results found for this basename: AST, ALT, ALKPHOS, BILITOT, PROT, ALBUMIN,  in the last 168 hours  CBC:  Recent Labs Lab 04/05/13 1600 04/06/13 0820  WBC 11.8* 9.1  HGB 13.9 13.9  HCT 41.4 41.2  MCV 97.9 96.7  PLT 183 190    Recent Results (from the past 240 hour(s))  MRSA PCR SCREENING     Status: Abnormal   Collection Time    04/05/13 11:47 PM      Result Value Ref Range Status   MRSA by PCR POSITIVE (*) NEGATIVE Final   Comment:            The GeneXpert MRSA Assay (FDA     approved for NASAL specimens     only), is one component of a     comprehensive MRSA colonization     surveillance program. It is not     intended to diagnose MRSA     infection nor to guide or     monitor treatment for     MRSA infections.     RESULT CALLED TO, READ BACK BY AND VERIFIED WITH:     CALLED TO RN [place] USSERY [phone] @0506  THANEY     Studies:  Recent x-ray studies have been reviewed in detail by the Attending Physician  Time spent :     Junious Silk, ANP [facility] Office  [phone] Pager [phone]  **If unable to reach the above provider after paging please contact the Flow Manager @ [phone]  On-Call/Text Page:      [email or web address]      password [email or web address]  If 7PM-7AM, please contact night-coverage [email or web address] Password [email or web address] 04/06/2013, 1:39 PM   LOS: 1 day   I have personally examined this patient and reviewed the entire database. I have reviewed the above note, made any necessary editorial changes, and agree with its content.  Lonia Blood, MD [facility]

## 2013-04-06 NOTE — Progress Notes (Signed)
Clinical Social Work Department BRIEF PSYCHOSOCIAL ASSESSMENT 04/06/2013  Patient:  [name],[name]     Account Number:  [id number]     Admit date:  04/05/2013  Clinical Social Worker:  [name],[name], LCSWA  Date/Time:  04/06/2013 04:00 PM  Referred by:  Physician  Date Referred:  04/06/2013 Referred for  SNF Placement   Other Referral:   Interview type:  Family Other interview type:   Spoke with pt daughter at bedside    PSYCHOSOCIAL DATA Living Status:  ALONE Admitted from facility:   Level of care:   Primary support name:  [name] [phone] Primary support relationship to patient:  CHILD, ADULT Degree of support available:   Pt has supportive family    CURRENT CONCERNS Current Concerns  Post-Acute Placement   Other Concerns:    SOCIAL WORK ASSESSMENT / PLAN CSW informed by RN CM that pt daughter requesting to speak about SNF. CSW visited pt room and spoke with pt daughter at bedside. Pt daughter stated that pt lives alone but her grandchildren have been taking turns staying with her at night and pt daughter is with pt during day time. At this time, pt daughter does not feel as though pt is okay to remain at home alone and family would like to consider SNF. Pt daughter is unfamiliar with area SNFs and has no preferences at this time. However, she did state that something close to the hospital would be most convenient for family to visit. CSW explained SNF referral process. Pt daughter [facility]   Assessment/plan status:  Psychosocial Support/Ongoing Assessment of Needs Other assessment/ plan:   Information/referral to community resources:   SNF list provided    PATIENT'S/FAMILY'S RESPONSE TO PLAN OF CARE: Pt family is wanting pt to dc to SNF       [name], LCSWA [phone]

## 2013-04-06 NOTE — Progress Notes (Addendum)
Clinical Social Work Department CLINICAL SOCIAL WORK PLACEMENT NOTE 04/06/2013  Patient:  [name],[name]  Account Number:  [id number] Admit date:  04/05/2013  Clinical Social Worker:  Sharol HarnessPOONUM Karry Barrilleaux, Theresia MajorsLCSWA  Date/time:  04/06/2013 04:10 PM  Clinical Social Work is seeking post-discharge placement for this patient at the following level of care:   SKILLED NURSING   (*CSW will update this form in Epic as items are completed)   04/06/2013  Patient/family provided with [facility] of Clinical Social Work's list of facilities offering this level of care within the geographic area requested by the patient (or if unable, by the patient's family).  04/06/2013  Patient/family informed of their freedom to choose among providers that offer the needed level of care, that participate in Medicare, Medicaid or managed care program needed by the patient, have an available bed and are willing to accept the patient.  04/06/2013  Patient/family informed of [facility]' ownership interest in [facility], as well as of the fact that they are under no obligation to receive care at this facility.  PASARR submitted to EDS on 04/06/2013 PASARR number received from EDS on 04/06/2013  FL2 transmitted to all facilities in geographic area requested by pt/family on  04/06/2013 FL2 transmitted to all facilities within larger geographic area on   Patient informed that his/her managed care company has contracts with or will negotiate with  certain facilities, including the following:     Patient/family informed of bed offers received:  04/07/2013 Patient chooses bed at  Physician recommends and patient chooses bed at    Patient to be transferred to  on   Patient to be transferred to facility by   The following physician request were entered in Epic:   Additional Comments:   Nargis Abrams, LCSWA [phone]

## 2013-04-06 NOTE — Evaluation (Addendum)
Physical Therapy Evaluation Patient Details Name: JOSELYNN AMOROSO MRN: [id number] DOB: 1914-06-01 Today's Date: 04/06/2013 Time: 4098-1191 PT Time Calculation (min): 20 min  PT Assessment / Plan / Recommendation History of Present Illness  80 year old female patient with history of hypertension as well as recurrent orthostatic hypotension requiring frequent adjustments in medications over the past several years. She also has a remote history of rectal cancer. She presented to the emergency department after falling x2 at her home. X-rays revealed L1 compression fx.   Clinical Impression  Pt adm due to the above. Pt presents with decreased functional mobility and overall deconditioning. Pt to benefit from skilled acute PT to address deficits listed below and increase mobility. PT was recommending SNF for post acute rehab but when daughter came in after session; daughter ensure d that pt can have 24/7 (A) upon acute D/C. Will cont to recommend SNF for post acute rehab until family is certain they can provide 24/7 physical (A). Pt has had multiple falls at home.     PT Assessment  Patient needs continued PT services    Follow Up Recommendations  Supervision/Assistance - 24 hour;Other (comment);SNF (unless family can provide her 24/7 (A) to go home)    Does the patient have the potential to tolerate intense rehabilitation      Barriers to Discharge Decreased caregiver support pt is at home alone during the day     Equipment Recommendations  None recommended by PT    Recommendations for Other Services OT consult   Frequency Min 3X/week    Precautions / Restrictions Precautions Precautions: Fall Precaution Comments: multiple recent falls at home  Restrictions Weight Bearing Restrictions: No   Pertinent Vitals/Pain C/o pain with activity but is more anxious; pain 2/10; vitals stable.       Mobility  Bed Mobility General bed mobility comments: pt sitting EOB on arrival and returend to  EOB Transfers Overall transfer level: Needs assistance Equipment used: Rolling walker (2 wheeled);Ambulation equipment used Transfers: Sit to/from Stand Sit to Stand: Min assist General transfer comment: pt requires max encouragement and cues for hand placement; pt pushing posteriorly and requires (A) to maintain balance; pt very anxious and requires tactile cues for sequencing Ambulation/Gait Ambulation/Gait assistance: Mod assist Ambulation Distance (Feet): 4 Feet (fwd and backward) Assistive device: Rolling walker (2 wheeled) Gait Pattern/deviations: Shuffle;Decreased stride length;Step-through pattern;Narrow base of support;Leaning posteriorly Gait velocity: very decreased Gait velocity interpretation: <1.8 ft/sec, indicative of risk for recurrent falls General Gait Details: pt with short shuffled gt; pt very fearful and becomes anxious with ambulating; able to take 2 steps fwd then 2 steps bwd; max encouragement and max multidirectional cues for sequencing; pt leaning posteriorly throughout ambulation and requires (A) to maintain balacne and manage RW     Exercises General Exercises - Lower Extremity Ankle Circles/Pumps: AROM;Both;Strengthening;10 reps;Seated   PT Diagnosis: Difficulty walking;Acute pain;Generalized weakness  PT Problem List: Decreased strength;Decreased activity tolerance;Decreased balance;Decreased mobility;Decreased cognition;Decreased knowledge of use of DME;Decreased safety awareness;Pain PT Treatment Interventions: DME instruction;Gait training;Functional mobility training;Therapeutic activities;Therapeutic exercise;Balance training;Stair training;Neuromuscular re-education;Patient/family education     PT Goals(Current goals can be found in the care plan section) Acute Rehab PT Goals Patient Stated Goal: to find my daughter  PT Goal Formulation: With patient/family Time For Goal Achievement: 04/20/13 Potential to Achieve Goals: Fair  Visit Information   Last PT Received On: 04/06/13 Assistance Needed: +2 (for safety; pt very anxious ) History of Present Illness: 80 year old female patient with history of hypertension as well as  recurrent orthostatic hypotension requiring frequent adjustments in medications over the past several years. She also has a remote history of rectal cancer. She presented to the emergency department after falling x2 at her home. X-rays revealed L1 compression fx.        Prior Functioning  Home Living Family/patient expects to be discharged to:: Skilled nursing facility Living Arrangements: Alone Available Help at Discharge: Family;Available PRN/intermittently Type of Home: House Home Access: Stairs to enter Entergy CorporationEntrance Stairs-Number of Steps: 2 Entrance Stairs-Rails: None Home Layout: One level Home Equipment: Environmental consultantWalker - 2 wheels Additional Comments: Daughter present at end of session; Daughter reports they are going to look into having 24/7 (A) for pt upon hospital D/C  Prior Function Level of Independence: Needs assistance Gait / Transfers Assistance Needed: pt reports she ambulates with RW in house  ADL's / Homemaking Assistance Needed: PTA pt would take bird baths or get into tub with daughter present; pt was cooking when she experienced her last fall Communication Communication: No difficulties    Cognition  Cognition Arousal/Alertness: Awake/alert Behavior During Therapy: Anxious Overall Cognitive Status: Impaired/Different from baseline Area of Impairment: Orientation;Following commands;Awareness;Problem solving;Safety/judgement Orientation Level: Disoriented to;Situation;Place Memory: Decreased short-term memory Following Commands: Follows one step commands with increased time Safety/Judgement: Decreased awareness of deficits;Decreased awareness of safety Problem Solving: Difficulty sequencing;Requires verbal cues;Requires tactile cues General Comments: pt very anxious and tearful; believes something has  happened to her daughter; asking for help with her cell phone but then reports she does not have her phone    Extremity/Trunk Assessment Upper Extremity Assessment Upper Extremity Assessment: Defer to OT evaluation Lower Extremity Assessment Lower Extremity Assessment: Generalized weakness Cervical / Trunk Assessment Cervical / Trunk Assessment: Kyphotic   Balance Balance Overall balance assessment: Needs assistance;History of Falls Sitting-balance support: Feet supported;Single extremity supported Sitting balance-Leahy Scale: Fair Postural control: Posterior lean Standing balance support: During functional activity;Bilateral upper extremity supported Standing balance-Leahy Scale: Poor Standing balance comment: pt very unsteady with standing; requires (A) to maintain balance; leaning posteriorly throughout session; anxious and high fall risk  General Comments General comments (skin integrity, edema, etc.): pt very tearful and confused; requires max encouragement for therapy and to reassure her of situation   End of Session PT - End of Session Equipment Utilized During Treatment: Gait belt Activity Tolerance: Other (comment);Treatment limited secondary to agitation (limited by anxiety and fear of falling ) Patient left: in bed;with call bell/phone within reach;with bed alarm set;with family/visitor present Nurse Communication: Mobility status;Precautions  GP     Donell SievertWest, Quintarius Ferns N, South CarolinaPT [phone] 04/06/2013, 2:51 PM

## 2013-04-07 DIAGNOSIS — Y92009 Unspecified place in unspecified non-institutional (private) residence as the place of occurrence of the external cause: Secondary | ICD-10-CM

## 2013-04-07 DIAGNOSIS — I951 Orthostatic hypotension: Secondary | ICD-10-CM

## 2013-04-07 LAB — BASIC METABOLIC PANEL
BUN: 25 mg/dL — AB (ref 6–23)
CO2: 24 mEq/L (ref 19–32)
Calcium: 8.4 mg/dL (ref 8.4–10.5)
Chloride: 107 mEq/L (ref 96–112)
Creatinine, Ser: 0.59 mg/dL (ref 0.50–1.10)
GFR calc Af Amer: 86 mL/min — ABNORMAL LOW (ref >90)
GFR calc non Af Amer: 74 mL/min — ABNORMAL LOW (ref >90)
GLUCOSE: 110 mg/dL — AB (ref 70–99)
POTASSIUM: 4 meq/L (ref 3.7–5.3)
Sodium: 145 mEq/L (ref 137–147)

## 2013-04-07 LAB — CALCIUM: CALCIUM: 8.8 mg/dL (ref 8.4–10.5)

## 2013-04-07 MED ORDER — DILTIAZEM HCL ER COATED BEADS 120 MG PO CP24
120.0000 mg | ORAL_CAPSULE | Freq: Every day | ORAL | Status: DC
  Administered 2013-04-07: 120 mg via ORAL
  Filled 2013-04-07 (×2): qty 1

## 2013-04-07 NOTE — Progress Notes (Signed)
Physical Therapy Treatment Patient Details Name: [name] MRN: [id number] DOB: 05-29-1914 Today's Date: 04/07/2013 Time: 4098-1191 PT Time Calculation (min): 37 min  PT Assessment / Plan / Recommendation  History of Present Illness 80 year old female patient with history of hypertension as well as recurrent orthostatic hypotension requiring frequent adjustments in medications over the past several years. She also has a remote history of rectal cancer. She presented to the emergency department after falling x2 at her home. X-rays revealed L1 compression fx.    PT Comments   Pt is physically crippled by her fear of falling.  She is not currently able to even mentally consider "rehabilitation" or "walking" due to her fear of falling.  She has, however, been getting up with RN staff to go to [facility] with RW and to get in the recliner chair with RW.  She will need constant re assurance and will need extensive time and slow progression of therapy to build her trust.  She is more appropriate for SNF level rehab at this time.    Follow Up Recommendations  SNF     Does the patient have the potential to tolerate intense rehabilitation    NA  Barriers to Discharge   Pt lives alone      Equipment Recommendations  None recommended by PT    Recommendations for Other Services   None  Frequency Min 2X/week   Progress towards PT Goals Progress towards PT goals: Progressing toward goals  Plan Discharge plan needs to be updated;Frequency needs to be updated    Precautions / Restrictions Precautions Precautions: Fall Precaution Comments: multiple recent falls at home    Pertinent Vitals/Pain See vitals flow sheet. At end of session RN informed PT that pt's HR was elevated (See vitals flow sheet.)     Mobility  Bed Mobility Overal bed mobility: Needs Assistance Bed Mobility: Supine to Sit;Sit to Supine Supine to sit: Min assist Sit to supine: Min assist General bed mobility comments: Pt needed  min assist to pull to sitting with bil hands and min assist to help one leg back into the bed.  Generalized posterior lean during transitions requiring support at her trunk.       PT Goals (current goals can now be found in the care plan section) Acute Rehab PT Goals Patient Stated Goal: to not be afraid of falling.   Visit Information  Last PT Received On: 04/07/13 Assistance Needed: +2 (due to pt's fear of falling) History of Present Illness: 80 year old female patient with history of hypertension as well as recurrent orthostatic hypotension requiring frequent adjustments in medications over the past several years. She also has a remote history of rectal cancer. She presented to the emergency department after falling x2 at her home. X-rays revealed L1 compression fx.     Subjective Data  Subjective: Pt reports she is going to stay in the hospital until her fear of falling goes away.   Patient Stated Goal: to not be afraid of falling.    Cognition  Cognition Arousal/Alertness: Awake/alert Behavior During Therapy: Anxious Overall Cognitive Status: Impaired/Different from baseline Memory: Decreased short-term memory General Comments: Pt is very anxious, decreased short term memory, but very oriented today.      Balance  Balance Overall balance assessment: History of Falls;Needs assistance Sitting-balance support: Feet supported;Bilateral upper extremity supported;No upper extremity supported Sitting balance-Leahy Scale: Fair Sitting balance - Comments: Sat EOB x >20 mins working on sitting tolerance, anxiety, deep breathing, pt showed therapist LE exercises she  has been doing (ankle pumps, LAQs, seated marches), therapist encouraged bil upper extremity exercises (shoulder flexion, however pt needed min assist to complete due to post LOB without at least one upper extremity supported).    End of Session PT - End of Session Activity Tolerance: Other (comment) (pt self limiting due to  anxiety/fear of falling. ) Patient left: in bed;with call bell/phone within reach Nurse Communication: Mobility status    Lurena JoinerRebecca B. Peniel Biel, PT, DPT [phone]   04/07/2013, 5:17 PM

## 2013-04-07 NOTE — Consult Note (Signed)
CARDIOLOGY CONSULT NOTE  Patient ID: [name] MRN: [id number] DOB/AGE: Sep 03, 1914 80 y.o.  Admit date: 04/05/2013 Primary Physician: Milsaps Primary Cardiologist: Nahser Reason for Consultation: Atrial fibrillation, orthostatic hypotension  HPI: 80 yo with history of paroxysmal atrial fibrillation and orthostatic hypotension presented to the ER after a mechanical fall with severe back pain.  She had an L1 compression fracture.  She tripped and fell while walking with her walker.  In the ER, she was noted to go into atrial fibrillation with RVR.  She converted over to NSR on diltiazem gtt (but also had a 4 second pause).  She went back into atrial fibrillation this morning.  Her HR is in the 120s-130s currently, she does not feel palpitations.  She walked to the bathroom this morning.  She had mild lightheadedness with standing but no more than her baseline.    Review of systems complete and found to be negative unless listed above in HPI  Past Medical History: 1. Paroxysmal atrial fibrillation: Had episode in 2007.  Had another episode this admission in setting of back pain. Echo (2/15): EF 60-65%.  2. Orthostatic hypotension: long-standing. 3. HTN 4. Syncope 5. Osteoporosis 6. H/o rectal cancer 7. Right femoral neck fracture in 2010.    Family History  Problem Relation Age of Onset  . Heart disease Neg Hx     History   Social History  . Marital Status: Widowed    Spouse Name: N/A    Number of Children: N/A  . Years of Education: N/A   Occupational History  . Not on file.   Social History Main Topics  . Smoking status: Never Smoker   . Smokeless tobacco: Never Used  . Alcohol Use: No  . Drug Use: No  . Sexual Activity: No   Other Topics Concern  . Not on file   Social History Narrative   [place].     Prescriptions prior to admission  Medication Sig Dispense Refill  . acetaminophen (TYLENOL) 500 MG tablet Take  500 mg by mouth daily as needed (pain).      . CRANBERRY PO Take 1 tablet by mouth daily.      . metoprolol tartrate (LOPRESSOR) 25 MG tablet Take 25 mg by mouth 2 (two) times daily.      . Naphazoline HCl (CLEAR EYES OP) Place 1 drop into both eyes at bedtime as needed (dry eyes/ itching).        Physical exam Blood pressure 132/89, pulse 142, temperature 98.5 F (36.9 C), temperature source Oral, resp. rate 18, height 5\' 3"  (1.6 m), weight 116 lb 7.2 oz (52.821 kg), SpO2 97.00%. General: NAD Neck: No JVD, no thyromegaly or thyroid nodule.  Lungs: Clear to auscultation bilaterally with normal respiratory effort. CV: Nondisplaced PMI.  Heart regular S1/S2, no S3/S4, no murmur.  No peripheral edema.  No carotid bruit.   Abdomen: Soft, nontender, no hepatosplenomegaly, no distention.  Skin: Intact without lesions or rashes.  Neurologic: Alert and oriented x 3.  Psych: Normal affect. Extremities: No clubbing or cyanosis.  HEENT: Normal.   Labs:   Lab Results  Component Value Date   WBC 9.1 04/06/2013   HGB 13.9 04/06/2013   HCT 41.2 04/06/2013   MCV 96.7 04/06/2013   PLT 190 04/06/2013    Recent Labs Lab 04/07/13 0347  NA 145  K 4.0  CL 107  CO2 24  BUN 25*  CREATININE 0.59  CALCIUM 8.4  GLUCOSE 110*    EKG: NSR, PAC  ASSESSMENT AND PLAN:  80 yo with history of paroxysmal atrial fibrillation and orthostatic hypotension presented to the ER after a mechanical fall with severe back pain/L1 compression fracture.  She has had PAF in the hospital. 1. Atrial fibrillation: Paroxysmal.  She is back in atrial fibrillation this morning.  EF normal on echo.  BP stable.  HR 120s-130s. She does not feel the atrial fibrillation.  Of note, she had a 4 second pause while on diltiazem gtt (? post-termination pause).  Suspect atrial fibrillation was triggered by pain after fall and compression fracture.  - Continue metoprolol 25 mg bid. - Will gently add diltiazem CD 120 mg daily to try to slow  rate a bit.  Would be careful given history of orthostasis.  - Not anticoagulation candidate with frequent falls.  2. Orthostatic hypotension: Seems to be at baseline.  Watch carefully with addition of diltiazem CD.   Marca Ancona 04/07/2013 10:28 AM

## 2013-04-07 NOTE — Progress Notes (Signed)
[name] 1 - Stepdown / ICU Progress Note  [name] AVW:[id number] DOB: 13-Nov-1914 DOA: 04/05/2013 PCP: Egbert Garibaldi, NP  Brief narrative: 80 year old female patient with history of hypertension as well as recurrent orthostatic hypotension requiring frequent adjustments in medications over the past several years. She also has a remote history of rectal cancer. She presented to the emergency department after falling x2 at her home. After the second fall she developed significant back pain and presented to the emergency department for treatment. The fall was described as mechanical in nature. The patient slipped while walking with her walker- she had no loss of consciousness although she did hit her head. Because of the significant pain she was unable to get up but she did have access to a phone, called her son and her family notified EMS. CT of the head was negative for acute findings, plain x-rays revealed compression fracture L1 of uncertain age. Left hip x-ray was negative. EKG was negative. No evidence of any acute infectious process. While in the emergency department and just after the admitting physician had finished examining the patient she developed atrial fibrillation with RVR with heart rates into the 130s but was hemodynamically stable. Cardizem drip was initiated.  Assessment/Plan:    Chronic Atrial fibrillation with acute RVR -Had converted to NSR but tback in AF with VR 130s today (2/26) despite home dose BB -Cardizem infusion previously stopped on 2/25 due to 4 second pause  -Cards consulted -Continue home Lopressor and adding cardizem CD carefully -Because of recurrent falls is not a candidate for anticoagulation    Lumbar compression fracture/Sacral fracture/ Fall at home -currently no significant pain- "soreness in bottom"  -PT/OT evaluation- CIR vs SNF -Patient's daughter at bedside on 2/25. Stated patient has been having difficulty with balance secondary  to her left leg giving way when she stands  - MRI of the lumbar sacral spine revealed acute/subacute superior endplate fracture L2 without retropulsion of bone as well as a leftward disc protrusion at L 4-5 -sacral imaging revealed acute transverse fracture of the sacrum at S4 level which is nondisplaced -Currently minimal pain on exam when back and sacral area palpated    Hypokalemia -Replete and follow lytes    HTN  -Continue Lopressor as well as Cardizem as outlined in atrial fibrillation -See below regarding need for permissive hypertension in this patient    Recurrent Orthostasis -Cautious mobilization after transitions from supine to standing - permissive HTN  DVT prophylaxis: Lovenox Code Status: Full Family Communication: No family at bedside Disposition Plan/Expected LOS: Telemetry - PT recommends 24/7 care at home with home health PT versus short stay for rehabilitation and skilled nursing facility. Patient and daughter would like to see if qualifies for CIR-OT evaluation pending  Consultants: Cardiology  Procedures: None  Antibiotics: None  HPI/Subjective: Patient alert and without complaints. Updated on MRI findings. Cardiology has already seen patient. Patient with minimal sacral discomfort. No awareness of palpitations this morning with heart rate in the 130s.  Objective: Blood pressure 132/89, pulse 142, temperature 98.5 F (36.9 C), temperature source Oral, resp. rate 18, height 5\' 3"  (1.6 m), weight 116 lb 7.2 oz (52.821 kg), SpO2 97.00%.  Intake/Output Summary (Last 24 hours) at 04/07/13 1234 Last data filed at 04/07/13 1053  Gross per 24 hour  Intake    400 ml  Output    300 ml  Net    100 ml   Exam: General: No acute respiratory distress Lungs: Clear  to auscultation bilaterally without wheezes or crackles, RA Cardiovascular: Irregular rate and rhythm without murmur gallop or rub normal S1 and S2, no peripheral edema or JVD-rate slightly tachycardic  even at rest Abdomen: Nontender, nondistended, soft, bowel sounds positive, no rebound, no ascites, no appreciable mass Musculoskeletal: No significant cyanosis, clubbing of bilateral lower extremities Neurological: Alert and oriented x 3, moves all extremities x 4 without focal neurological deficits, CN 2-12 intact  Scheduled Meds:  Scheduled Meds: . aspirin  81 mg Oral Daily  . Chlorhexidine Gluconate Cloth  6 each Topical Q0600  . diltiazem  120 mg Oral Daily  . docusate sodium  100 mg Oral BID  . enoxaparin (LOVENOX) injection  40 mg Subcutaneous Q24H  . lactose free nutrition  237 mL Oral Q1400  . metoprolol tartrate  25 mg Oral BID  . mupirocin ointment  1 application Nasal BID    Data Reviewed: Basic Metabolic Panel:  Recent Labs Lab 04/05/13 1600 04/06/13 0820 04/07/13 0347  NA 145 144 145  K 3.9 3.5* 4.0  CL 103 105 107  CO2 26 25 24   GLUCOSE 111* 104* 110*  BUN 17 14 25*  CREATININE 0.50 0.47* 0.59  CALCIUM 8.9 8.5 8.4   Liver Function Tests: No results found for this basename: AST, ALT, ALKPHOS, BILITOT, PROT, ALBUMIN,  in the last 168 hours  CBC:  Recent Labs Lab 04/05/13 1600 04/06/13 0820  WBC 11.8* 9.1  HGB 13.9 13.9  HCT 41.4 41.2  MCV 97.9 96.7  PLT 183 190    Recent Results (from the past 240 hour(s))  MRSA PCR SCREENING     Status: Abnormal   Collection Time    04/05/13 11:47 PM      Result Value Ref Range Status   MRSA by PCR POSITIVE (*) NEGATIVE Final   Comment:            The GeneXpert MRSA Assay (FDA     approved for NASAL specimens     only), is one component of a     comprehensive MRSA colonization     surveillance program. It is not     intended to diagnose MRSA     infection nor to guide or     monitor treatment for     MRSA infections.     RESULT CALLED TO, READ BACK BY AND VERIFIED WITH:     CALLED TO RN [name] [phone] @[phone]  [name]     Studies:  Recent x-ray studies have been reviewed in detail by the Attending  Physician  Time spent : 35mins     Junious Silkllison Ellis, ANP [facility] Office  [phone] Pager [phone]  **If unable to reach the above provider after paging please contact the Flow Manager @ [phone]  On-Call/Text Page:      [email or web address]      password [email or web address]  If 7PM-7AM, please contact night-coverage [email or web address] Password [email or web address] 04/07/2013, 12:34 PM   LOS: 2 days    I have examined the patient, reviewed the chart and modified the above note which I agree with.   Nasia Cannan,MD [phone] 04/07/2013, 4:32 PM

## 2013-04-07 NOTE — Consult Note (Signed)
Physical Medicine and Rehabilitation Consult Reason for Consult: Lumbar compression fracture Referring Physician: [facility]   HPI: [name] is a 80 y.o. right-handed female with history of atrial fibrillation, rectal cancer, recurrent orthostatic hypotension requiring frequent adjustments in her medications. Patient independent with a walker prior to admission living with her daughter. Presented  04/05/2013 after fall x2. Complaints of significant back pain. Cranial CT scan negative. MRI lumbar spine showed acute/subacute superior endplate fracture at L2 of approximately 20% loss of height without retropulsion. Chronic vertebral plana fracture at L1. Question plan for supportive back brace. Pain management as directed. Subcutaneous Lovenox for DVT prophylaxis. Close monitoring of blood pressure with history of recurrent orthostasis and echocardiogram completed showing ejection fraction 65% no wall motion abnormalities. 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 Physical therapy evaluation completed. Recommendations of physical medicine rehabilitation consult   Review of Systems  Cardiovascular: Positive for palpitations.  Musculoskeletal: Positive for falls.  Neurological: Positive for dizziness and headaches.  Psychiatric/Behavioral:       Anxiety  All other systems reviewed and are negative.   Past Medical History  Diagnosis Date  . Hypertension   . MVP (mitral valve prolapse)     Not mentioned on 08/2005 echo however  . OP (osteoporosis)   . History of rectal cancer   . Fracture of femoral neck, right 2010  . Wrist fracture     left  . Dizziness   . Pneumonia     "once"  . Blood transfusion     "my own blood"  . Headache(784.0)     "terrible while going thru menopause; none since then"  . Arthritis   . Anxiety     "now & then; I get over it"; denies RX  . Syncope 06/10/11    "fell; I've been having these spells; off & on; last time was maybe 2 yr ago"    . Atrial fibrillation     08/2005, normal EF at that time  . Tachyarrhythmia 06/11/11    burst of ST   Past Surgical History  Procedure Laterality Date  . Transthoracic echocardiogram  08/20/2005    EF 60%  . Colonoscopy    . Total hip arthroplasty      bilaterally  . Inguinal hernia repair      right  . Breast cyst excision      right  . Cataract extraction w/ intraocular lens  implant, bilateral    . Colectomy     Family History  Problem Relation Age of Onset  . Heart disease Neg Hx    Social History:  reports that she has never smoked. She has never used smokeless tobacco. She reports that she does not drink alcohol or use illicit drugs. Allergies:  Allergies  Allergen Reactions  . Irbesartan Other (See Comments)    Caused heart to race  . Sulfa Drugs Cross Reactors Hives and Other (See Comments)    Almost passed out   Medications Prior to Admission  Medication Sig Dispense Refill  . acetaminophen (TYLENOL) 500 MG tablet Take 500 mg by mouth daily as needed (pain).      . CRANBERRY PO Take 1 tablet by mouth daily.      . metoprolol tartrate (LOPRESSOR) 25 MG tablet Take 25 mg by mouth 2 (two) times daily.      . Naphazoline HCl (CLEAR EYES OP) Place 1 drop into both eyes at bedtime as needed (dry eyes/ itching).  Home: Home Living Family/patient expects to be discharged to:: Skilled nursing facility Living Arrangements: Alone Available Help at Discharge: Family;Available PRN/intermittently Type of Home: House Home Access: Stairs to enter Entrance Stairs-Number of Steps: 2 Entrance Stairs-Rails: None Home Layout: One level Home Equipment: Environmental consultant - 2 wheels Additional Comments: Daughter present at end of session; Daughter reports they are going to look into having 24/7 (A) for pt upon hospital D/C   Functional History:   Functional Status:  Mobility:     Ambulation/Gait Ambulation Distance (Feet): 4 Feet (fwd and backward) Gait velocity: very  decreased General Gait Details: pt with short shuffled gt; pt very fearful and becomes anxious with ambulating; able to take 2 steps fwd then 2 steps bwd; max encouragement and max multidirectional cues for sequencing; pt leaning posteriorly throughout ambulation and requires (A) to maintain balacne and manage RW     ADL:    Cognition: Cognition Overall Cognitive Status: Impaired/Different from baseline Orientation Level: Oriented to person;Oriented to place;Oriented to time;Oriented to situation Cognition Arousal/Alertness: Awake/alert Behavior During Therapy: Anxious Overall Cognitive Status: Impaired/Different from baseline Area of Impairment: Orientation;Following commands;Awareness;Problem solving;Safety/judgement Orientation Level: Disoriented to;Situation;Place Memory: Decreased short-term memory Following Commands: Follows one step commands with increased time Safety/Judgement: Decreased awareness of deficits;Decreased awareness of safety Problem Solving: Difficulty sequencing;Requires verbal cues;Requires tactile cues General Comments: pt very anxious and tearful; believes something has happened to her daughter; asking for help with her cell phone but then reports she does not have her phone  Blood pressure 177/75, pulse 70, temperature 98.5 F (36.9 C), temperature source Oral, resp. rate 18, height 5\' 3"  (1.6 m), weight 52.821 kg (116 lb 7.2 oz), SpO2 97.00%. Physical Exam  Vitals reviewed. Constitutional: She is oriented to person, place, and time.  HENT:  Head: Normocephalic.  Eyes: EOM are normal.  Neck: Normal range of motion. Neck supple. No thyromegaly present.  Cardiovascular:  Cardiac rate controlled  Respiratory: Effort normal and breath sounds normal. No respiratory distress.  GI: Soft. Bowel sounds are normal. She exhibits no distension.  Neurological: She is alert and oriented to person, place, and time.  Follows commands  Skin: Skin is warm and dry.    5/5 strength in bilateral deltoid, bicep, tricep, grip, hip flexors, knee extensors, ankle dorsiflexors and plantar flexor Patient has some left-sided low back pain with left arm extension as well as left leg extension Sensation intact to light touch bilateral upper and lower limbs  Results for orders placed during the hospital encounter of 04/05/13 (from the past 24 hour(s))  CBC     Status: None   Collection Time    04/06/13  8:20 AM      Result Value Ref Range   WBC 9.1  4.0 - 10.5 K/uL   RBC 4.26  3.87 - 5.11 MIL/uL   Hemoglobin 13.9  12.0 - 15.0 g/dL   HCT 69.6  29.5 - 28.4 %   MCV 96.7  78.0 - 100.0 fL   MCH 32.6  26.0 - 34.0 pg   MCHC 33.7  30.0 - 36.0 g/dL   RDW 13.2  44.0 - 10.2 %   Platelets 190  150 - 400 K/uL  BASIC METABOLIC PANEL     Status: Abnormal   Collection Time    04/06/13  8:20 AM      Result Value Ref Range   Sodium 144  137 - 147 mEq/L   Potassium 3.5 (*) 3.7 - 5.3 mEq/L   Chloride 105  96 -  112 mEq/L   CO2 25  19 - 32 mEq/L   Glucose, Bld 104 (*) 70 - 99 mg/dL   BUN 14  6 - 23 mg/dL   Creatinine, Ser 1.61 (*) 0.50 - 1.10 mg/dL   Calcium 8.5  8.4 - 09.6 mg/dL   GFR calc non Af Amer 80 (*) >90 mL/min   GFR calc Af Amer >90  >90 mL/min  BASIC METABOLIC PANEL     Status: Abnormal   Collection Time    04/07/13  3:47 AM      Result Value Ref Range   Sodium 145  137 - 147 mEq/L   Potassium 4.0  3.7 - 5.3 mEq/L   Chloride 107  96 - 112 mEq/L   CO2 24  19 - 32 mEq/L   Glucose, Bld 110 (*) 70 - 99 mg/dL   BUN 25 (*) 6 - 23 mg/dL   Creatinine, Ser 0.45  0.50 - 1.10 mg/dL   Calcium 8.4  8.4 - 40.9 mg/dL   GFR calc non Af Amer 74 (*) >90 mL/min   GFR calc Af Amer 86 (*) >90 mL/min   Dg Lumbar Spine 2-3 Views  04/05/2013   CLINICAL DATA:  Low back pain status post fall  EXAM: LUMBAR SPINE - 2-3 VIEW  COMPARISON:  No previous studies back to 2007 were found.  FINDINGS: There is high-grade compression of the body of L1 that is of uncertain age. The loss of  height anteriorly is approximately 60% and posteriorly approximately 30%. There is no definite retropulsion of bone. The other vertebral bodies appear preserved in height. There is disc space narrowing at L4-5 with grade 1 anterolisthesis of L4 with respect to L5. There is facet joint degenerative change at L4-5 and at L5-S1. As best as can be determined the pedicles and transverse processes are intact. The observed portions of the sacrum are grossly normal.  IMPRESSION: 1. There is high-grade compression of the body of L1 that is of uncertain age. 2. There is degenerative disc and facet joint change at L4-5 and grade 1 anterolisthesis. 3. There is diffuse osteopenia.   Electronically Signed   By: David  [place]   On: 04/05/2013 15:53   Dg Hip Complete Left  04/05/2013   CLINICAL DATA:  Left hip pain status post fall, history of previous bilateral hip joint replacement  EXAM: LEFT HIP - COMPLETE 2+ VIEW  COMPARISON:  None.  FINDINGS: The bony pelvis is osteopenic. No acute pelvic fracture is demonstrated. 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 AP and frog-leg lateral views of the prosthetic left hip joint reveal no abnormality of the prosthesis. The interface with the native bone appears normal.  IMPRESSION: There is no acute bony abnormality of the left hip. The observed portions of the bony pelvis exhibit no acute abnormality.   Electronically Signed   By: David  [place]   On: 04/05/2013 15:49   Ct Head Wo Contrast  04/05/2013   CLINICAL DATA:  Several falls.  EXAM: CT HEAD WITHOUT CONTRAST  TECHNIQUE: Contiguous axial images were obtained from the base of the skull through the vertex without intravenous contrast.  COMPARISON:  06/11/2011.  FINDINGS: No skull fracture or intracranial hemorrhage.  Calcification posterior left frontal lobe unchanged.  Prominent small vessel disease type changes without CT evidence of large acute infarct.  No intracranial mass lesion noted on this unenhanced exam.   Opacification right maxillary sinus with hyperdense material centrally without change.  Vascular calcifications.  IMPRESSION: No  skull fracture or intracranial hemorrhage.  Calcification posterior left frontal lobe unchanged most likely related to remote insult.  Prominent small vessel disease type changes without CT evidence of large acute infarct.  No intracranial mass lesion noted on this unenhanced exam.  Opacification right maxillary sinus with hyperdense material centrally without change.   Electronically Signed   By: Bridgett LarssonSteve  Olson M.D.   On: 04/05/2013 15:36   Mr Lumbar Spine Wo Contrast  04/06/2013   CLINICAL DATA:  Fall from ladder. Severe low back pain. Unable to ambulate. L1 compression fracture.  EXAM: MRI LUMBAR SPINE WITHOUT CONTRAST  TECHNIQUE: Multiplanar, multisequence MR imaging was performed. No intravenous contrast was administered.  COMPARISON:  Lumbar spine radiographs 04/05/2013  FINDINGS: The severe compression fracture at L1 scratch the normal signal is present in the conus medullaris which terminates at L1, within normal limits. A severe L1 compression fracture is healed. There is some retropulsion of bone which enters spinal canal but does not create any significant stenosis.  Acute/ subacute fracture is present at L2 with approximately 20% loss of height across the superior endplate. There is no retropulsion of bone associated with this fracture. Marrow signal and vertebral body heights are maintained in the lower lumbar spine. Grade 1 anterolisthesis is present at L3-4. Anterolisthesis at L4-5 measures 7 mm.  T12-L1: The retropulsed bone and facet hypertrophy results in mild foraminal narrowing.  L1-2: Asymmetric right-sided facet hypertrophy is present with mild right foraminal narrowing.  L2-3: Moderate facet hypertrophy is present bilaterally. Mild foraminal narrowing is worse on the right.  L3-4: A far right lateral disc protrusion is present. Mild facet hypertrophy is noted  bilaterally. Mild foraminal narrowing is noted on both sides.  L4-5: A leftward disc protrusion is evident. Moderate facet hypertrophy is noted. This results in moderate left lateral recess narrowing. Mild foraminal narrowing is worse on the left.  L5-S1: Mild disc bulging and facet hypertrophy is present without significant stenosis.  Prominent Tarlov cysts are asymmetric to the left. The sacrum is intact. There is diffuse fatty infiltration of the marrow containing spaces.  IMPRESSION: 1. Acute/subacute superior endplate fracture at L2 of approximately 20% loss of height but no retropulsion of bone. 2. The vertebra plana fracture at L1 is chronic. 3. Mild foraminal narrowing bilaterally at T12-L1. 4. Mild right foraminal narrowing at L1-2. 5. Mild foraminal narrowing at L2-3 is worse on the right. 6. Mild foraminal narrowing bilaterally at L3-4. 7. Multilevel facet hypertrophy contributes to the foraminal disease. 8. Leftward disc protrusion at L4-5 with moderate left lateral recess and mild foraminal narrowing. 9. Prominent by left cysts are asymmetric to the left.   Electronically Signed   By: Gennette Pachris  Mattern M.D.   On: 04/06/2013 19:21    Assessment/Plan: Diagnosis: L2 and plate fracture without retropulsion, no neurologic deficit 1. Does the need for close, 24 hr/day medical supervision in concert with the patient's rehab needs make it unreasonable for this patient to be served in a less intensive setting? No 2. Co-Morbidities requiring supervision/potential complications: Atrial fibrillation 3. Due to safety and skin/wound care, does the patient require 24 hr/day rehab nursing? No 4. Does the patient require coordinated care of a physician, rehab nurse, N/A to address physical and functional deficits in the context of the above medical diagnosis(es)? No Addressing deficits in the following areas: balance, endurance, locomotion, strength and transferring 5. Can the patient actively participate in an  intensive therapy program of at least 3 hrs of therapy per day at  least 5 days per week? No 6. The potential for patient to make measurable gains while on inpatient rehab is poor 7. Anticipated functional outcomes upon discharge from inpatient rehab are N/A with PT, NA with OT, NA with SLP. 8. Estimated rehab length of stay to reach the above functional goals is: NA 9. Does the patient have adequate social supports to accommodate these discharge functional goals? Potentially 10. Anticipated D/C setting: Home 11. Anticipated post D/C treatments: HH therapy 12. Overall Rehab/Functional Prognosis: good  RECOMMENDATIONS: This patient's condition is appropriate for continued rehabilitative care in the following setting: SNF Patient has agreed to participate in recommended program. Yes Note that insurance prior authorization may be required for reimbursement for recommended care.  Comment: Patient unable to tolerate more intensive program, patient prefers "not to be pushed"    04/07/2013

## 2013-04-07 NOTE — Progress Notes (Signed)
OT Cancellation Note  Patient Details Name: Amber PressmanRuth T Creason MRN: [id number] DOB: [birth year]   Cancelled Treatment:    Reason Eval/Treat Not Completed: Other (comment) Pt to D/C to SNF. Plan to defer OT to SNF. If eval needed for admission, please call [phone] or text page OT at [phone]. Thank you. [facility], LLCWARD,HILLARY Francely Craw, OTR/L  [phone] 04/07/2013 04/07/2013, 8:21 AM

## 2013-04-08 DIAGNOSIS — I4891 Unspecified atrial fibrillation: Secondary | ICD-10-CM

## 2013-04-08 LAB — BASIC METABOLIC PANEL
BUN: 21 mg/dL (ref 6–23)
CHLORIDE: 106 meq/L (ref 96–112)
CO2: 23 mEq/L (ref 19–32)
Calcium: 8.4 mg/dL (ref 8.4–10.5)
Creatinine, Ser: 0.51 mg/dL (ref 0.50–1.10)
GFR calc Af Amer: >90 mL/min (ref >90)
GFR, EST NON AFRICAN AMERICAN: 78 mL/min — AB (ref >90)
Glucose, Bld: 146 mg/dL — ABNORMAL HIGH (ref 70–99)
POTASSIUM: 4 meq/L (ref 3.7–5.3)
Sodium: 142 mEq/L (ref 137–147)

## 2013-04-08 MED ORDER — BISACODYL 10 MG RE SUPP: 10.0000 mg | Freq: Once | RECTAL | Status: DC

## 2013-04-08 MED ORDER — FLEET ENEMA 7-19 GM/118ML RE ENEM
1.0000 | ENEMA | Freq: Every day | RECTAL | Status: DC | PRN
  Filled 2013-04-08: qty 1

## 2013-04-08 MED ORDER — BOOST PLUS PO LIQD: 237.0000 mL | Freq: Every day | ORAL | Status: AC

## 2013-04-08 MED ORDER — POLYETHYLENE GLYCOL 3350 17 G PO PACK
17.0000 g | PACK | Freq: Once | ORAL | Status: AC
  Administered 2013-04-08: 17 g via ORAL
  Filled 2013-04-08: qty 1

## 2013-04-08 MED ORDER — DILTIAZEM HCL ER COATED BEADS 180 MG PO CP24
180.0000 mg | ORAL_CAPSULE | Freq: Every day | ORAL | Status: DC
  Administered 2013-04-08 – 2013-04-09 (×2): 180 mg via ORAL
  Filled 2013-04-08 (×2): qty 1

## 2013-04-08 MED ORDER — HYDROCODONE-ACETAMINOPHEN 5-325 MG PO TABS: 1.0000 | ORAL_TABLET | ORAL | Status: DC | PRN

## 2013-04-08 MED ORDER — DSS 100 MG PO CAPS: 100.0000 mg | ORAL_CAPSULE | Freq: Two times a day (BID) | ORAL | Status: DC

## 2013-04-08 MED ORDER — DILTIAZEM HCL ER COATED BEADS 180 MG PO CP24: 180.0000 mg | ORAL_CAPSULE | Freq: Every day | ORAL | Status: DC

## 2013-04-08 MED ORDER — ASPIRIN 81 MG PO CHEW: 81.0000 mg | CHEWABLE_TABLET | Freq: Every day | ORAL | Status: DC

## 2013-04-08 NOTE — Progress Notes (Addendum)
       Patient Name: Amber PressmanRuth T Berry Date of Encounter: 04/08/2013    SUBJECTIVE:Sitting and eating breakfast. No dyspnea.  TELEMETRY:  A fib with poor rate control. Filed Vitals:   04/07/13 0911 04/07/13 1448 04/07/13 2125 04/08/13 0645  BP: 132/89 138/84 146/71 154/83  Pulse: 142 128 122 93  Temp:  98.4 F (36.9 C) 98.2 F (36.8 C) 98.4 F (36.9 C)  TempSrc:  Oral Oral Oral  Resp:  21 20 20   Height:      Weight:      SpO2:  97% 94% 93%    Intake/Output Summary (Last 24 hours) at 04/08/13 0819 Last data filed at 04/07/13 1820  Gross per 24 hour  Intake    660 ml  Output    350 ml  Net    310 ml    LABS: Basic Metabolic Panel:  Recent Labs  16/11/9600/26/15 0347 04/07/13 1155 04/08/13 0305  NA 145  --  142  K 4.0  --  4.0  CL 107  --  106  CO2 24  --  23  GLUCOSE 110*  --  146*  BUN 25*  --  21  CREATININE 0.59  --  0.51  CALCIUM 8.4 8.8 8.4   CBC:  Recent Labs  04/05/13 1600 04/06/13 0820  WBC 11.8* 9.1  HGB 13.9 13.9  HCT 41.4 41.2  MCV 97.9 96.7  PLT 183 190   BNP    Component Value Date/Time   PROBNP 243.0* 12/17/2007 0345    Radiology/Studies:  No new data  Physical Exam: Blood pressure 154/83, pulse 93, temperature 98.4 F (36.9 C), temperature source Oral, resp. rate 20, height 5\' 3"  (1.6 m), weight 116 lb 7.2 oz (52.821 kg), SpO2 93.00%. Weight change:    Lungs are clear No gallop  ASSESSMENT:  1. A fib with only moderate rate control. Asymptomatic. 2. Elderly and frail  Plan:  1. Rate control without anticoagulation(fall risk). Increase dilt to 180 mg daily.  Selinda EonSigned, SMITH III,HENRY W 04/08/2013, 8:19 AM

## 2013-04-08 NOTE — Discharge Summary (Signed)
Physician Discharge Summary  Patient ID: [name] MRN: [id number] DOB/AGE: 78-Nov-1916 78 y.o.  Admit date: 04/05/2013 Discharge date: 04/09/2013  Primary Care Physician:  Egbert Garibaldi, NP  Discharge Diagnoses:   . Atrial fibrillation with RVR . Lumbar compression fracture/sacral fracture  . HTN (hypertension) . Orthostasis . Hypokalemia Constipation  Consults: Cardiology, Dr. Katrinka Blazing                   Inpatient rehabilitation    Recommendations for Outpatient Follow-up:  Fall precautions Avoid pushing, pulling, lifting weights more than 10 pounds.   Allergies:   Allergies  Allergen Reactions  . Irbesartan Other (See Comments)    Caused heart to race  . Sulfa Drugs Cross Reactors Hives and Other (See Comments)    Almost passed out     Discharge Medications:   Medication List         acetaminophen 500 MG tablet  Commonly known as:  TYLENOL  Take 500 mg by mouth daily as needed (pain).     aspirin 81 MG chewable tablet  Chew 1 tablet (81 mg total) by mouth daily.     CLEAR EYES OP  Place 1 drop into both eyes at bedtime as needed (dry eyes/ itching).     CRANBERRY PO  Take 1 tablet by mouth daily.     diltiazem 180 MG 24 hr capsule  Commonly known as:  CARDIZEM CD  Take 1 capsule (180 mg total) by mouth daily.     DSS 100 MG Caps  Take 100 mg by mouth 2 (two) times daily.     HYDROcodone-acetaminophen 5-325 MG per tablet  Commonly known as:  NORCO/VICODIN  Take 1 tablet by mouth every 4 (four) hours as needed for moderate pain.     lactose free nutrition Liqd  Take 237 mLs by mouth daily at 2 PM daily at 2 PM.     metoprolol tartrate 25 MG tablet  Commonly known as:  LOPRESSOR  Take 25 mg by mouth 2 (two) times daily.         Brief H and P: For complete details please refer to admission H and P, but in brief 80 year old female with a history of hypertension, rectal cancer presented after 2 mechanical falls on the day of admission The  patient was walking to the kitchen for lunch using her walker when she slipped and fell onto the floor hitting her left side. The patient also hit her head. She did not lose consciousness. The patient had significant back pain after the fall. She was not able to get up. She called her son with her telephone that was around her neck. EMS was activated. The patient has been in her usual state of health prior to the mechanical fall. She denied any fevers, chills, chest discomfort, shortness breath, nausea, vomiting, diarrhea, abdominal pain, dysuria, hematuria. The patient has had nocturia. The patient denied any headaches or dizziness. She lives alone, but her family comes to check on on her frequently.  In ED, the patient continued to have significant pain with minimal movement of her lower back. CT of the brain was negative for any acute findings. X-ray of the lumbar spine showed compression fracture of L1, uncertain age. Left hip x-ray was negative. EKG shows sinus rhythm without ST-T wave changes. WBC was noted to be: 0.8. Serum sodium was 145. Urinalysis was negative for any pyuria.  Separately in the ER patient converted into atrial fibrillation with RVR, heart rate  130s. She was started on diltiazem drip and admitted for further workup.    Hospital Course:   80 year old female patient with history of hypertension as well as recurrent orthostatic hypotension requiring frequent adjustments in medications over the past several years. She also has a remote history of rectal cancer. She presented to the emergency department after falling x2 at her home. After the second fall she developed significant back pain and presented to the emergency department for treatment.   CT of the head was negative for acute findings, plain x-rays revealed compression fracture L1 of uncertain age. Left hip x-ray was negative. EKG was negative. No evidence of any acute infectious process. While in the emergency department and  just after the admitting physician had finished examining the patient she developed atrial fibrillation with RVR with heart rates into the 130s but was hemodynamically stable. Cardizem drip was initiated and patient was admitted to step down unit.  Chronic Atrial fibrillation with acute RVR patient has a known history of atrial fibrillation. She was admitted to step down unit on Cardizem drip however patient subsequently developed a 4 second pause on 2/25 hence it was discontinued. Cardiology was consulted and recommended continuing beta blocker and oral Cardizem. 2-D echo was done which showed EF of 60-65% normal wall motion. Due to her history of recurrent falls and she is not considered a candidate for anticoagulation.   Lumbar compression fracture/Sacral fracture/ Fall at home  - PT/OT evaluation recommended CIR vs SNF . Patient was evaluated by inpatient rehabilitation and recommended skilled nursing facility.  - MRI of the lumbar sacral spine revealed acute/subacute superior endplate fracture L2 without retropulsion of bone as well as a leftward disc protrusion at L 4-5. Sacral imaging revealed acute transverse fracture of the sacrum at S4 level which is nondisplaced. The patient has minimal pain on exam and back and sacral area palpated although she still feels stiff and would benefit from a rehabilitation at skilled nursing facility . Inpatient rehabilitation was also consulted who recommended SNF.   Hypokalemia resolved after replacement  HTN  -Continue Lopressor as well as Cardizem as outlined in atrial fibrillation  -See below regarding need for permissive hypertension in this patient   Recurrent Orthostasis  -Cautious mobilization after transitions from supine to standing - permissive HTN     Day of Discharge BP 151/66  Pulse 64  Temp(Src) 98.1 F (36.7 C) (Oral)  Resp 18  Ht 5\' 3"  (1.6 m)  Wt 52.821 kg (116 lb 7.2 oz)  BMI 20.63 kg/m2  SpO2 97%  Physical Exam:  General:  Alert and awake, oriented not in any acute distress.  CVS: Irregularly irregular  Chest: clear to auscultation bilaterally, no wheezing, rales or rhonchi  Abdomen: soft nontender, nondistended, normal bowel sounds  Extremities: no cyanosis, clubbing or edema noted bilaterally  Neuro: Cranial nerves II-XII intact, no focal neurological deficits   The results of significant diagnostics from this hospitalization (including imaging, microbiology, ancillary and laboratory) are listed below for reference.    LAB RESULTS: Basic Metabolic Panel:  Recent Labs Lab 04/07/13 0347 04/07/13 1155 04/08/13 0305  NA 145  --  142  K 4.0  --  4.0  CL 107  --  106  CO2 24  --  23  GLUCOSE 110*  --  146*  BUN 25*  --  21  CREATININE 0.59  --  0.51  CALCIUM 8.4 8.8 8.4   Liver Function Tests: No results found for this basename: AST, ALT,  ALKPHOS, BILITOT, PROT, ALBUMIN,  in the last 168 hours No results found for this basename: LIPASE, AMYLASE,  in the last 168 hours No results found for this basename: AMMONIA,  in the last 168 hours CBC:  Recent Labs Lab 04/05/13 1600 04/06/13 0820  WBC 11.8* 9.1  HGB 13.9 13.9  HCT 41.4 41.2  MCV 97.9 96.7  PLT 183 190   Cardiac Enzymes: No results found for this basename: CKTOTAL, CKMB, CKMBINDEX, TROPONINI,  in the last 168 hours BNP: No components found with this basename: POCBNP,  CBG: No results found for this basename: GLUCAP,  in the last 168 hours  Significant Diagnostic Studies:  Dg Lumbar Spine 2-3 Views  04/05/2013   CLINICAL DATA:  Low back pain status post fall  EXAM: LUMBAR SPINE - 2-3 VIEW  COMPARISON:  No previous studies back to 2007 were found.  FINDINGS: There is high-grade compression of the body of L1 that is of uncertain age. The loss of height anteriorly is approximately 60% and posteriorly approximately 30%. There is no definite retropulsion of bone. The other vertebral bodies appear preserved in height. There is disc space  narrowing at L4-5 with grade 1 anterolisthesis of L4 with respect to L5. There is facet joint degenerative change at L4-5 and at L5-S1. As best as can be determined the pedicles and transverse processes are intact. The observed portions of the sacrum are grossly normal.  IMPRESSION: 1. There is high-grade compression of the body of L1 that is of uncertain age. 2. There is degenerative disc and facet joint change at L4-5 and grade 1 anterolisthesis. 3. There is diffuse osteopenia.   Electronically Signed   By: David  [place]   On: 04/05/2013 15:53   Dg Hip Complete Left  04/05/2013   CLINICAL DATA:  Left hip pain status post fall, history of previous bilateral hip joint replacement  EXAM: LEFT HIP - COMPLETE 2+ VIEW  COMPARISON:  None.  FINDINGS: The bony pelvis is osteopenic. No acute pelvic fracture is demonstrated. 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 AP and frog-leg lateral views of the prosthetic left hip joint reveal no abnormality of the prosthesis. The interface with the native bone appears normal.  IMPRESSION: There is no acute bony abnormality of the left hip. The observed portions of the bony pelvis exhibit no acute abnormality.   Electronically Signed   By: David  [place]   On: 04/05/2013 15:49   Ct Head Wo Contrast  04/05/2013   CLINICAL DATA:  Several falls.  EXAM: CT HEAD WITHOUT CONTRAST  TECHNIQUE: Contiguous axial images were obtained from the base of the skull through the vertex without intravenous contrast.  COMPARISON:  06/11/2011.  FINDINGS: No skull fracture or intracranial hemorrhage.  Calcification posterior left frontal lobe unchanged.  Prominent small vessel disease type changes without CT evidence of large acute infarct.  No intracranial mass lesion noted on this unenhanced exam.  Opacification right maxillary sinus with hyperdense material centrally without change.  Vascular calcifications.  IMPRESSION: No skull fracture or intracranial hemorrhage.  Calcification posterior  left frontal lobe unchanged most likely related to remote insult.  Prominent small vessel disease type changes without CT evidence of large acute infarct.  No intracranial mass lesion noted on this unenhanced exam.  Opacification right maxillary sinus with hyperdense material centrally without change.   Electronically Signed   By: Bridgett Larsson M.D.   On: 04/05/2013 15:36   Mr Lumbar Spine Wo Contrast  04/06/2013   CLINICAL DATA:  Fall from ladder. Severe low back pain. Unable to ambulate. L1 compression fracture.  EXAM: MRI LUMBAR SPINE WITHOUT CONTRAST  TECHNIQUE: Multiplanar, multisequence MR imaging was performed. No intravenous contrast was administered.  COMPARISON:  Lumbar spine radiographs 04/05/2013  FINDINGS: The severe compression fracture at L1 scratch the normal signal is present in the conus medullaris which terminates at L1, within normal limits. A severe L1 compression fracture is healed. There is some retropulsion of bone which enters spinal canal but does not create any significant stenosis.  Acute/ subacute fracture is present at L2 with approximately 20% loss of height across the superior endplate. There is no retropulsion of bone associated with this fracture. Marrow signal and vertebral body heights are maintained in the lower lumbar spine. Grade 1 anterolisthesis is present at L3-4. Anterolisthesis at L4-5 measures 7 mm.  T12-L1: The retropulsed bone and facet hypertrophy results in mild foraminal narrowing.  L1-2: Asymmetric right-sided facet hypertrophy is present with mild right foraminal narrowing.  L2-3: Moderate facet hypertrophy is present bilaterally. Mild foraminal narrowing is worse on the right.  L3-4: A far right lateral disc protrusion is present. Mild facet hypertrophy is noted bilaterally. Mild foraminal narrowing is noted on both sides.  L4-5: A leftward disc protrusion is evident. Moderate facet hypertrophy is noted. This results in moderate left lateral recess narrowing.  Mild foraminal narrowing is worse on the left.  L5-S1: Mild disc bulging and facet hypertrophy is present without significant stenosis.  Prominent Tarlov cysts are asymmetric to the left. The sacrum is intact. There is diffuse fatty infiltration of the marrow containing spaces.  IMPRESSION: 1. Acute/subacute superior endplate fracture at L2 of approximately 20% loss of height but no retropulsion of bone. 2. The vertebra plana fracture at L1 is chronic. 3. Mild foraminal narrowing bilaterally at T12-L1. 4. Mild right foraminal narrowing at L1-2. 5. Mild foraminal narrowing at L2-3 is worse on the right. 6. Mild foraminal narrowing bilaterally at L3-4. 7. Multilevel facet hypertrophy contributes to the foraminal disease. 8. Leftward disc protrusion at L4-5 with moderate left lateral recess and mild foraminal narrowing. 9. Prominent by left cysts are asymmetric to the left.   Electronically Signed   By: Gennette Pachris  Mattern M.D.   On: 04/06/2013 19:21   Mr Sacrum/si Joints Wo Contrast  04/07/2013   CLINICAL DATA:  Severe low back pain secondary to a fall.  EXAM: MR SACRUM WITHOUT CONTRAST  TECHNIQUE: Multiplanar, multisequence MR imaging was performed. No intravenous contrast was administered.  COMPARISON:  None.  FINDINGS: There is a nondisplaced transverse fracture through the S4 segment of the sacrum. The fracture extends slightly superiorly on the left to the S3 level. The coccyx is not visible.  There are multiple large Tarlov cysts in the sacrum.  IMPRESSION: Probable acute transverse fracture of the sacrum at the S4 level, nondisplaced.   Electronically Signed   By: Geanie CooleyJim  Maxwell M.D.   On: 04/07/2013 09:37    2D ECHO: Study Conclusions  Left ventricle: The cavity size was normal. Systolic function was normal. The estimated ejection fraction was in the range of 60% to 65%. Wall motion was normal; there were no regional wall motion abnormalities.    Disposition and Follow-up:    DISPOSITION: Skilled  nursing facility   DIET: Heart healthy diet     DISCHARGE FOLLOW-UP Follow-up Information   Follow up with Millsaps, Joelene MillinKIMBERLY M, NP. Schedule an appointment as soon as possible for a visit in 10 days. (for hospital follow-up)  Contact information:   [facility] [address] [place] [address] [phone]       Follow up with Elyn Aquas., MD. Schedule an appointment as soon as possible for a visit in 2 weeks. (for hospital follow-up)    Specialty:  Cardiology   Contact information:   [address] [place] [address] [phone]       Time spent on Discharge: 45 mins  Signed:   Donnalyn Juran M.D. [facility] 04/09/2013, 9:41 AM Pager: [phone]

## 2013-04-08 NOTE — Progress Notes (Signed)
Rehab admissions - Evaluated for possible admission.  Please see rehab MD consult recommending SNF placement.  Call me for questions.  [phone]

## 2013-04-08 NOTE — Progress Notes (Signed)
CSW (Clinical Child psychotherapistocial Worker) confirmed with facility that pt can dc over weekend. Facility requesting dc summary today by 4pm and if needed this dc summary can be updated on day of dc. CSW informed MD.  Sharol HarnessPoonum Irina Okelly, LCSWA [phone]

## 2013-04-08 NOTE — Progress Notes (Signed)
Patient ID: [name]  female  ZOX:[id number]    DOB: [birth year]    DOA: 04/05/2013  PCP: Egbert Garibaldi, NP  Assessment/Plan: Active Problems:   HTN (hypertension)   Orthostasis   Lumbar compression fracture   Atrial fibrillation with RVR   Fall at home   Hypokalemia  Chronic Atrial fibrillation with acute RVR  -Still with elevated heart rate, in 150s this morning, Cardizem increased to 180 mg daily today - Cardiology following, Cardizem infusion previously stopped on 2/25 due to 4 second pause  - Because of recurrent falls is not a candidate for anticoagulation   Lumbar compression fracture/Sacral fracture/ Fall at home  - PT/OT evaluation recommended CIR vs SNF . Patient was evaluated by inpatient rehabilitation and recommended skilled nursing facility. - MRI of the lumbar sacral spine revealed acute/subacute superior endplate fracture L2 without retropulsion of bone as well as a leftward disc protrusion at L 4-5  -sacral imaging revealed acute transverse fracture of the sacrum at S4 level which is nondisplaced  -Currently minimal pain on exam when back and sacral area palpated   Hypokalemia resolved  HTN  -Continue Lopressor as well as Cardizem as outlined in atrial fibrillation  -See below regarding need for permissive hypertension in this patient   Recurrent Orthostasis  -Cautious mobilization after transitions from supine to standing - permissive HTN   DVT Prophylaxis: Lovenox  Code Status: Full code  Family Communication:  Disposition: Hopefully tomorrow to skilled nursing facility if medically stable and heart rate well controlled  Consultants:  Cardiology, Dr. Katrinka Blazing  Procedures:  CT head, left hip and lumbar spine x-ray  MRI of the lumbar spine and sacrum  Antibiotics:  None    Subjective: Patient alert and awake, feels a sore otherwise stable, heart rate still uncontrolled, A. fib in 150s earlier this morning  Objective: Weight  change:   Intake/Output Summary (Last 24 hours) at 04/08/13 1104 Last data filed at 04/07/13 1820  Gross per 24 hour  Intake    380 ml  Output    200 ml  Net    180 ml   Blood pressure 154/83, pulse 93, temperature 98.4 F (36.9 C), temperature source Oral, resp. rate 20, height 5\' 3"  (1.6 m), weight 52.821 kg (116 lb 7.2 oz), SpO2 93.00%.  Physical Exam: General: Alert and awake, oriented  not in any acute distress. CVS: Irregularly irregular Chest: clear to auscultation bilaterally, no wheezing, rales or rhonchi Abdomen: soft nontender, nondistended, normal bowel sounds  Extremities: no cyanosis, clubbing or edema noted bilaterally Neuro: Cranial nerves II-XII intact, no focal neurological deficits  Lab Results: Basic Metabolic Panel:  Recent Labs Lab 04/07/13 0347 04/07/13 1155 04/08/13 0305  NA 145  --  142  K 4.0  --  4.0  CL 107  --  106  CO2 24  --  23  GLUCOSE 110*  --  146*  BUN 25*  --  21  CREATININE 0.59  --  0.51  CALCIUM 8.4 8.8 8.4   Liver Function Tests: No results found for this basename: AST, ALT, ALKPHOS, BILITOT, PROT, ALBUMIN,  in the last 168 hours No results found for this basename: LIPASE, AMYLASE,  in the last 168 hours No results found for this basename: AMMONIA,  in the last 168 hours CBC:  Recent Labs Lab 04/05/13 1600 04/06/13 0820  WBC 11.8* 9.1  HGB 13.9 13.9  HCT 41.4 41.2  MCV 97.9 96.7  PLT 183 190   Cardiac  Enzymes: No results found for this basename: CKTOTAL, CKMB, CKMBINDEX, TROPONINI,  in the last 168 hours BNP: No components found with this basename: POCBNP,  CBG: No results found for this basename: GLUCAP,  in the last 168 hours   Micro Results: Recent Results (from the past 240 hour(s))  MRSA PCR SCREENING     Status: Abnormal   Collection Time    04/05/13 11:47 PM      Result Value Ref Range Status   MRSA by PCR POSITIVE (*) NEGATIVE Final   Comment:            The GeneXpert MRSA Assay (FDA     approved  for NASAL specimens     only), is one component of a     comprehensive MRSA colonization     surveillance program. It is not     intended to diagnose MRSA     infection nor to guide or     monitor treatment for     MRSA infections.     RESULT CALLED TO, READ BACK BY AND VERIFIED WITH:     CALLED TO RN [place] USSERY [phone] @0506  THANEY    Studies/Results: Dg Lumbar Spine 2-3 Views  04/05/2013   CLINICAL DATA:  Low back pain status post fall  EXAM: LUMBAR SPINE - 2-3 VIEW  COMPARISON:  No previous studies back to 2007 were found.  FINDINGS: There is high-grade compression of the body of L1 that is of uncertain age. The loss of height anteriorly is approximately 60% and posteriorly approximately 30%. There is no definite retropulsion of bone. The other vertebral bodies appear preserved in height. There is disc space narrowing at L4-5 with grade 1 anterolisthesis of L4 with respect to L5. There is facet joint degenerative change at L4-5 and at L5-S1. As best as can be determined the pedicles and transverse processes are intact. The observed portions of the sacrum are grossly normal.  IMPRESSION: 1. There is high-grade compression of the body of L1 that is of uncertain age. 2. There is degenerative disc and facet joint change at L4-5 and grade 1 anterolisthesis. 3. There is diffuse osteopenia.   Electronically Signed   By: David  [place]   On: 04/05/2013 15:53   Dg Hip Complete Left  04/05/2013   CLINICAL DATA:  Left hip pain status post fall, history of previous bilateral hip joint replacement  EXAM: LEFT HIP - COMPLETE 2+ VIEW  COMPARISON:  None.  FINDINGS: The bony pelvis is osteopenic. No acute pelvic fracture is demonstrated. 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 AP and frog-leg lateral views of the prosthetic left hip joint reveal no abnormality of the prosthesis. The interface with the native bone appears normal.  IMPRESSION: There is no acute bony abnormality of the left hip. The observed  portions of the bony pelvis exhibit no acute abnormality.   Electronically Signed   By: David  [place]   On: 04/05/2013 15:49   Ct Head Wo Contrast  04/05/2013   CLINICAL DATA:  Several falls.  EXAM: CT HEAD WITHOUT CONTRAST  TECHNIQUE: Contiguous axial images were obtained from the base of the skull through the vertex without intravenous contrast.  COMPARISON:  06/11/2011.  FINDINGS: No skull fracture or intracranial hemorrhage.  Calcification posterior left frontal lobe unchanged.  Prominent small vessel disease type changes without CT evidence of large acute infarct.  No intracranial mass lesion noted on this unenhanced exam.  Opacification right maxillary sinus with hyperdense material centrally without change.  Vascular calcifications.  IMPRESSION: No skull fracture or intracranial hemorrhage.  Calcification posterior left frontal lobe unchanged most likely related to remote insult.  Prominent small vessel disease type changes without CT evidence of large acute infarct.  No intracranial mass lesion noted on this unenhanced exam.  Opacification right maxillary sinus with hyperdense material centrally without change.   Electronically Signed   By: Bridgett Larsson M.D.   On: 04/05/2013 15:36   Mr Lumbar Spine Wo Contrast  04/06/2013   CLINICAL DATA:  Fall from ladder. Severe low back pain. Unable to ambulate. L1 compression fracture.  EXAM: MRI LUMBAR SPINE WITHOUT CONTRAST  TECHNIQUE: Multiplanar, multisequence MR imaging was performed. No intravenous contrast was administered.  COMPARISON:  Lumbar spine radiographs 04/05/2013  FINDINGS: The severe compression fracture at L1 scratch the normal signal is present in the conus medullaris which terminates at L1, within normal limits. A severe L1 compression fracture is healed. There is some retropulsion of bone which enters spinal canal but does not create any significant stenosis.  Acute/ subacute fracture is present at L2 with approximately 20% loss of height  across the superior endplate. There is no retropulsion of bone associated with this fracture. Marrow signal and vertebral body heights are maintained in the lower lumbar spine. Grade 1 anterolisthesis is present at L3-4. Anterolisthesis at L4-5 measures 7 mm.  T12-L1: The retropulsed bone and facet hypertrophy results in mild foraminal narrowing.  L1-2: Asymmetric right-sided facet hypertrophy is present with mild right foraminal narrowing.  L2-3: Moderate facet hypertrophy is present bilaterally. Mild foraminal narrowing is worse on the right.  L3-4: A far right lateral disc protrusion is present. Mild facet hypertrophy is noted bilaterally. Mild foraminal narrowing is noted on both sides.  L4-5: A leftward disc protrusion is evident. Moderate facet hypertrophy is noted. This results in moderate left lateral recess narrowing. Mild foraminal narrowing is worse on the left.  L5-S1: Mild disc bulging and facet hypertrophy is present without significant stenosis.  Prominent Tarlov cysts are asymmetric to the left. The sacrum is intact. There is diffuse fatty infiltration of the marrow containing spaces.  IMPRESSION: 1. Acute/subacute superior endplate fracture at L2 of approximately 20% loss of height but no retropulsion of bone. 2. The vertebra plana fracture at L1 is chronic. 3. Mild foraminal narrowing bilaterally at T12-L1. 4. Mild right foraminal narrowing at L1-2. 5. Mild foraminal narrowing at L2-3 is worse on the right. 6. Mild foraminal narrowing bilaterally at L3-4. 7. Multilevel facet hypertrophy contributes to the foraminal disease. 8. Leftward disc protrusion at L4-5 with moderate left lateral recess and mild foraminal narrowing. 9. Prominent by left cysts are asymmetric to the left.   Electronically Signed   By: Gennette Pac M.D.   On: 04/06/2013 19:21   Mr Sacrum/si Joints Wo Contrast  04/07/2013   CLINICAL DATA:  Severe low back pain secondary to a fall.  EXAM: MR SACRUM WITHOUT CONTRAST   TECHNIQUE: Multiplanar, multisequence MR imaging was performed. No intravenous contrast was administered.  COMPARISON:  None.  FINDINGS: There is a nondisplaced transverse fracture through the S4 segment of the sacrum. The fracture extends slightly superiorly on the left to the S3 level. The coccyx is not visible.  There are multiple large Tarlov cysts in the sacrum.  IMPRESSION: Probable acute transverse fracture of the sacrum at the S4 level, nondisplaced.   Electronically Signed   By: Geanie Cooley M.D.   On: 04/07/2013 09:37    Medications: Scheduled Meds: . aspirin  81 mg Oral Daily  . Chlorhexidine Gluconate Cloth  6 each Topical Q0600  . diltiazem  180 mg Oral Daily  . docusate sodium  100 mg Oral BID  . enoxaparin (LOVENOX) injection  40 mg Subcutaneous Q24H  . lactose free nutrition  237 mL Oral Q1400  . metoprolol tartrate  25 mg Oral BID  . mupirocin ointment  1 application Nasal BID      LOS: 3 days   RAI,RIPUDEEP M.D. [facility] 04/08/2013, 11:04 AM Pager: [phone]  If 7PM-7AM, please contact night-coverage [email or web address] Password [email or web address]

## 2013-04-11 ENCOUNTER — Other Ambulatory Visit: Payer: Self-pay | Admitting: *Deleted

## 2013-04-11 LAB — VITAMIN D 1,25 DIHYDROXY
VITAMIN D3 1, 25 (OH): 89 pg/mL
Vitamin D 1, 25 (OH)2 Total: 89 pg/mL — ABNORMAL HIGH (ref 18–72)
Vitamin D2 1, 25 (OH)2: <8 pg/mL

## 2013-04-11 MED ORDER — HYDROCODONE-ACETAMINOPHEN 5-325 MG PO TABS: ORAL_TABLET | ORAL | Status: DC

## 2013-04-11 NOTE — Telephone Encounter (Signed)
[facility] 

## 2013-04-12 ENCOUNTER — Non-Acute Institutional Stay (SKILLED_NURSING_FACILITY): Payer: Federal, State, Local not specified - PPO | Admitting: Nurse Practitioner

## 2013-04-12 DIAGNOSIS — S32009A Unspecified fracture of unspecified lumbar vertebra, initial encounter for closed fracture: Secondary | ICD-10-CM

## 2013-04-12 DIAGNOSIS — I10 Essential (primary) hypertension: Secondary | ICD-10-CM

## 2013-04-12 DIAGNOSIS — I4891 Unspecified atrial fibrillation: Secondary | ICD-10-CM

## 2013-04-12 DIAGNOSIS — S32000A Wedge compression fracture of unspecified lumbar vertebra, initial encounter for closed fracture: Secondary | ICD-10-CM

## 2013-04-12 DIAGNOSIS — I951 Orthostatic hypotension: Secondary | ICD-10-CM

## 2013-04-12 NOTE — Progress Notes (Signed)
Patient ID: [name], female   DOB: 01/31/1915, 80 y.o.   MRN: [id number]    Nursing Home Location:  [facility]   Place of Service: SNF (31)  PCP: Egbert Garibaldi, NP  Allergies  Allergen Reactions  . Irbesartan Other (See Comments)    Caused heart to race  . Sulfa Drugs Cross Reactors Hives and Other (See Comments)    Almost passed out    Chief Complaint  Patient presents with  . Hospitalization Follow-up   HPI: 80 year old female patient with history of hypertension as well as recurrent orthostatic hypotension, a fib with RVR, remote history of rectal cancer. Who went to the hospital after a fall and  plain x-rays revealed compression fracture L1 of uncertain age. Pt is now at [facility] for ongoing inpatient rehab for strength and gait training.   Review of Systems:  Review of Systems  Constitutional: Negative for fever, chills and malaise/fatigue.  HENT: Negative for congestion and sore throat.   Respiratory: Negative for cough and shortness of breath.   Cardiovascular: Negative for chest pain and palpitations.  Gastrointestinal: Negative for heartburn, abdominal pain, diarrhea and constipation.  Genitourinary: Negative for dysuria, urgency and frequency.  Musculoskeletal: Positive for back pain.  Skin: Negative.   Neurological: Positive for weakness. Negative for dizziness, sensory change and headaches.     Past Medical History  Diagnosis Date  . Hypertension   . MVP (mitral valve prolapse)     Not mentioned on 08/2005 echo however  . OP (osteoporosis)   . History of rectal cancer   . Fracture of femoral neck, right 2010  . Wrist fracture     left  . Dizziness   . Pneumonia     "once"  . Blood transfusion     "my own blood"  . Headache(784.0)     "terrible while going thru menopause; none since then"  . Arthritis   . Anxiety     "now & then; I get over it"; denies RX  . Syncope 06/10/11    "fell; I've been having these spells; off  & on; last time was maybe 2 yr ago"  . Atrial fibrillation     08/2005, normal EF at that time  . Tachyarrhythmia 06/11/11    burst of ST   Past Surgical History  Procedure Laterality Date  . Transthoracic echocardiogram  08/20/2005    EF 60%  . Colonoscopy    . Total hip arthroplasty      bilaterally  . Inguinal hernia repair      right  . Breast cyst excision      right  . Cataract extraction w/ intraocular lens  implant, bilateral    . Colectomy     Social History:   reports that she has never smoked. She has never used smokeless tobacco. She reports that she does not drink alcohol or use illicit drugs.  Family History  Problem Relation Age of Onset  . Heart disease Neg Hx     Medications: Patient's Medications  New Prescriptions   No medications on file  Previous Medications   ACETAMINOPHEN (TYLENOL) 500 MG TABLET    Take 500 mg by mouth daily as needed (pain).   ASPIRIN 81 MG CHEWABLE TABLET    Chew 1 tablet (81 mg total) by mouth daily.   CRANBERRY PO    Take 1 tablet by mouth daily.   DILTIAZEM (CARDIZEM CD) 180 MG 24 HR CAPSULE    Take 1  capsule (180 mg total) by mouth daily.   DOCUSATE SODIUM 100 MG CAPS    Take 100 mg by mouth 2 (two) times daily.   HYDROCODONE-ACETAMINOPHEN (NORCO/VICODIN) 5-325 MG PER TABLET    Take one tablet by mouth every four hours as needed for moderate pain   LACTOSE FREE NUTRITION (BOOST PLUS) LIQD    Take 237 mLs by mouth daily at 2 PM daily at 2 PM.   METOPROLOL TARTRATE (LOPRESSOR) 25 MG TABLET    Take 25 mg by mouth 2 (two) times daily.   NAPHAZOLINE HCL (CLEAR EYES OP)    Place 1 drop into both eyes at bedtime as needed (dry eyes/ itching).  Modified Medications   No medications on file  Discontinued Medications   No medications on file     Physical Exam:  Filed Vitals:   04/12/13 1730  BP: 130/77  Pulse: 69  Temp: 98.5 F (36.9 C)  Resp: 20   Physical Exam  Constitutional: She is oriented to person, place, and time and  well-developed, well-nourished, and in no distress.  HENT:  Mouth/Throat: Oropharynx is clear and moist. No oropharyngeal exudate.  Eyes: Conjunctivae and EOM are normal. Pupils are equal, round, and reactive to light.  Neck: Normal range of motion. Neck supple.  Cardiovascular: Normal rate and normal heart sounds.  An irregular rhythm present.  Pulmonary/Chest: Effort normal and breath sounds normal. No respiratory distress.  Abdominal: Soft. Bowel sounds are normal. She exhibits no distension.  Musculoskeletal: She exhibits tenderness (lumbar spine). She exhibits no edema.  Neurological: She is alert and oriented to person, place, and time.  Skin: Skin is warm and dry. No erythema.  Psychiatric: Affect normal.      Labs reviewed: Basic Metabolic Panel:  Recent Labs  82/95/6202/25/15 0820 04/07/13 0347 04/07/13 1155 04/08/13 0305  NA 144 145  --  142  K 3.5* 4.0  --  4.0  CL 105 107  --  106  CO2 25 24  --  23  GLUCOSE 104* 110*  --  146*  BUN 14 25*  --  21  CREATININE 0.47* 0.59  --  0.51  CALCIUM 8.5 8.4 8.8 8.4   Liver Function Tests: No results found for this basename: AST, ALT, ALKPHOS, BILITOT, PROT, ALBUMIN,  in the last 8760 hours No results found for this basename: LIPASE, AMYLASE,  in the last 8760 hours No results found for this basename: AMMONIA,  in the last 8760 hours CBC:  Recent Labs  04/05/13 1600 04/06/13 0820  WBC 11.8* 9.1  HGB 13.9 13.9  HCT 41.4 41.2  MCV 97.9 96.7  PLT 183 190   Cardiac Enzymes: No results found for this basename: CKTOTAL, CKMB, CKMBINDEX, TROPONINI,  in the last 8760 hours BNP: No components found with this basename: POCBNP,  CBG: No results found for this basename: GLUCAP,  in the last 8760 hours TSH:  Recent Labs  04/05/13 2255  TSH 1.470    Assessment/Plan 1. HTN (hypertension) -stable with metoprolol and cardizem  -due to orthostatis blood pressure is not to be controlled tightly   2. Orthostasis -recommended  for cautious mobilization after transitions from supine to standing   3. Atrial fibrillation with RVR -currently rate controlled; conts on metoprolol and cardizem -not on anticoagulation due to frequent falls  -conts ASA  4. Lumbar compression fracture  MRI of the lumbar sacral spine revealed acute/subacute superior endplate fracture L2 without retropulsion of bone as well as a leftward disc protrusion  at L 4-5. Sacral imaging revealed acute transverse fracture of the sacrum at S4 level which is nondisplaced. Reports pain is controlled on current medication and conts Pt/OT

## 2013-04-18 ENCOUNTER — Encounter: Payer: Self-pay | Admitting: Cardiovascular Disease

## 2013-04-18 ENCOUNTER — Non-Acute Institutional Stay (SKILLED_NURSING_FACILITY): Payer: Federal, State, Local not specified - PPO | Admitting: Internal Medicine

## 2013-04-18 ENCOUNTER — Ambulatory Visit (INDEPENDENT_AMBULATORY_CARE_PROVIDER_SITE_OTHER): Payer: Federal, State, Local not specified - PPO | Admitting: Cardiovascular Disease

## 2013-04-18 ENCOUNTER — Encounter: Payer: Self-pay | Admitting: Internal Medicine

## 2013-04-18 VITALS — BP 110/70 | HR 76 | Ht 63.0 in | Wt 119.0 lb

## 2013-04-18 DIAGNOSIS — I10 Essential (primary) hypertension: Secondary | ICD-10-CM

## 2013-04-18 DIAGNOSIS — I4891 Unspecified atrial fibrillation: Secondary | ICD-10-CM

## 2013-04-18 DIAGNOSIS — S32000A Wedge compression fracture of unspecified lumbar vertebra, initial encounter for closed fracture: Secondary | ICD-10-CM

## 2013-04-18 DIAGNOSIS — S32009A Unspecified fracture of unspecified lumbar vertebra, initial encounter for closed fracture: Secondary | ICD-10-CM

## 2013-04-18 NOTE — Assessment & Plan Note (Addendum)
Treated with diltizem and lopressor;pr has h/o low BP and orthostaic hypotension so it is better that BP runs on the higher end

## 2013-04-18 NOTE — Progress Notes (Signed)
[name] Date of Birth  1914-12-23       [facility]    [place] [address]  [address], [address] [place], [place]  [address]   [place], [place]  [phone]     [phone]   Fax  [phone]    Fax [phone]  Problem List: 1. Orthostatic Hypotension  - BP meds adjusted: HCTZ dc'd, Lopressor decreased during hospitalization April , 2013 2. Dysuria  - Urinalysis w/ (+) leukocytes, nitrite negative; urine culture pending  - Macrobid initiated on 5/1  Secondary Discharge Diagnoses:  1. Atrial Fibrillation  2. Hypertension  3. Mitral Valve Prolapse  4. H/o Rectal Cancer  5. Osteoporosis  6. Right Femoral neck fracture 2010  7. Left Wrist fracture   History of Present Illness:  She's done very well since she left the hospital. She was admitted in April with episodes of orthostatic hypotension. We made some medication adjustments including stopping her HCTZ and we decreased her Toprol dose.  We have had to cut her blood pressure medicines such that she has l high readings especially in the morning. This is because her drops her blood pressure dropped so quickly when she stands up.    She continues to be unsteady when she walks. She occasionally will walk with a cane or walker. We have encouraged her to use her cane and walker every time she goes out to walk.  May 25, 2012:  [name] is doing well from a cardiac standpoint.  She has had lots of UTIs but these are better.    She has significant orthostasis.  Her BP is usually pretty good in the mornings - 140-150.    April 18, 2013:  [name] was admitted to the hospital recently because of syncope.  Current Outpatient Prescriptions on File Prior to Visit  Medication Sig Dispense Refill  . acetaminophen (TYLENOL) 500 MG tablet Take 500 mg by mouth daily as needed (pain).      Marland Kitchen aspirin 81 MG chewable tablet Chew 1 tablet (81 mg total) by mouth daily.      Marland Kitchen  CRANBERRY PO Take 1 tablet by mouth daily.      Marland Kitchen diltiazem (CARDIZEM CD) 180 MG 24 hr capsule Take 1 capsule (180 mg total) by mouth daily.      Marland Kitchen HYDROcodone-acetaminophen (NORCO/VICODIN) 5-325 MG per tablet Take one tablet by mouth every four hours as needed for moderate pain  180 tablet  0  . lactose free nutrition (BOOST PLUS) LIQD Take 237 mLs by mouth daily at 2 PM daily at 2 PM.    0  . metoprolol tartrate (LOPRESSOR) 25 MG tablet Take 25 mg by mouth 2 (two) times daily.      . Naphazoline HCl (CLEAR EYES OP) Place 1 drop into both eyes at bedtime as needed (dry eyes/ itching).       No current facility-administered medications on file prior to visit.    Allergies  Allergen Reactions  . Irbesartan Other (See Comments)    Caused heart to race  . Sulfa Drugs Cross Reactors Hives and Other (See Comments)    Almost passed out    Past Medical History  Diagnosis Date  . Hypertension   . MVP (mitral valve prolapse)     Not mentioned on 08/2005 echo however  . OP (osteoporosis)   . History of rectal cancer   . Fracture of femoral neck, right 2010  . Wrist  fracture     left  . Dizziness   . Pneumonia     "once"  . Blood transfusion     "my own blood"  . Headache(784.0)     "terrible while going thru menopause; none since then"  . Arthritis   . Anxiety     "now & then; I get over it"; denies RX  . Syncope 06/10/11    "fell; I've been having these spells; off & on; last time was maybe 2 yr ago"  . Atrial fibrillation     08/2005, normal EF at that time  . Tachyarrhythmia 06/11/11    burst of ST    Past Surgical History  Procedure Laterality Date  . Transthoracic echocardiogram  08/20/2005    EF 60%  . Colonoscopy    . Total hip arthroplasty      bilaterally  . Inguinal hernia repair      right  . Breast cyst excision      right  . Cataract extraction w/ intraocular lens  implant, bilateral    . Colectomy      History  Smoking status  . Never Smoker   Smokeless  tobacco  . Never Used    History  Alcohol Use No    Family History  Problem Relation Age of Onset  . Heart disease Neg Hx     Reviw of Systems:  Reviewed in the HPI.  All other systems are negative.  Physical Exam: Blood pressure 110/70, pulse 76, height 5\' 3"  (1.6 m), weight 119 lb (53.978 kg). General: Well developed, well nourished, in no acute distress.  Looks quite good for her age  Head: Normocephalic, atraumatic,  mucus membranes are moist,   Neck: Supple. Carotids are 2 + without bruits. No JVD  Lungs: Clear bilaterally to auscultation.  Heart: regular rate.  normal  S1 S2. No murmurs, gallops or rubs.  Abdomen: Soft, non-tender, non-distended with normal bowel sounds. No hepatomegaly. No rebound/guarding. No masses.  Msk:  Strength and tone are normal  Extremities: No clubbing or cyanosis. No edema.  Distal pedal pulses are 2+ and equal bilaterally.  Neuro: Alert and oriented X 3. Moves all extremities spontaneously.  Psych:  Responds to questions appropriately with a normal affect.  ECG:  Assessment / Plan:

## 2013-04-18 NOTE — Assessment & Plan Note (Signed)
Pt fell at home;MRI subacute/acute fx L2 and acute transverse fx of sacrum at S4 requiring admit to SNF for OT/PT.

## 2013-04-18 NOTE — Patient Instructions (Signed)
Your physician wants you to follow-up in: 6 months with ekg,  You will receive a reminder letter in the mail two months in advance. If you don't receive a letter, please call our office to schedule the follow-up appointment.   Your physician recommends that you continue on your current medications as directed. Please refer to the Current Medication list given to you today.

## 2013-04-18 NOTE — Assessment & Plan Note (Signed)
Rate controlled with lopressor and diltiazrm;prophylaxis with ASA 81 mg only;fall risk so no anti-coag

## 2013-04-18 NOTE — Assessment & Plan Note (Signed)
[name] seems to be doing a lot better. She was admitted to hospital recently with an episode of syncope and rapid atrial fibrillation. Her rate is better controlled with the addition of diltiazem. We'll continue with the current dose of metoprolol. She's not had any further episodes of syncope or presyncope. Her atrial fibrillation is chronic.  She has a history of falling the past. She'll need to be careful not to fall in the future. I'll see her again in 6 months.

## 2013-04-18 NOTE — Progress Notes (Signed)
MRN: [id number] Name: Amber PressmanRuth T Berry  Sex: female Age: 80 y.o. DOB: [birth year]  [facility]Room: 107A Level Of Care: SNF Provider: Merrilee SeashoreALEXANDER, Keagen Heinlen D Emergency Contacts: Extended Emergency Contact Information Primary Emergency Contact: Davies,Roberta Address: [address]          [place] [id number] [place] Mobile Phone: [phone] Relation: Daughter  Code Status: DNR  Allergies: Irbesartan and Sulfa drugs cross reactors  Chief Complaint  Patient presents with  . nursing home admission    HPI: Patient is 80 y.o. female who fell at home and sustained an L2 and sacral fx who is admitted for OT/PT.  Past Medical History  Diagnosis Date  . Hypertension   . MVP (mitral valve prolapse)     Not mentioned on 08/2005 echo however  . OP (osteoporosis)   . History of rectal cancer   . Fracture of femoral neck, right 2010  . Wrist fracture     left  . Dizziness   . Pneumonia     "once"  . Blood transfusion     "my own blood"  . Headache(784.0)     "terrible while going thru menopause; none since then"  . Arthritis   . Anxiety     "now & then; I get over it"; denies RX  . Syncope 06/10/11    "fell; I've been having these spells; off & on; last time was maybe 2 yr ago"  . Atrial fibrillation     08/2005, normal EF at that time  . Tachyarrhythmia 06/11/11    burst of ST    Past Surgical History  Procedure Laterality Date  . Transthoracic echocardiogram  08/20/2005    EF 60%  . Colonoscopy    . Total hip arthroplasty      bilaterally  . Inguinal hernia repair      right  . Breast cyst excision      right  . Cataract extraction w/ intraocular lens  implant, bilateral    . Colectomy        Medication List       This list is accurate as of: 04/18/13 12:22 PM.  Always use your most recent med list.               acetaminophen 500 MG tablet  Commonly known as:  TYLENOL  Take 500 mg by mouth daily as needed (pain).      aspirin 81 MG chewable tablet  Chew 1 tablet (81 mg total) by mouth daily.     CLEAR EYES OP  Place 1 drop into both eyes at bedtime as needed (dry eyes/ itching).     CRANBERRY PO  Take 1 tablet by mouth daily.     diltiazem 180 MG 24 hr capsule  Commonly known as:  CARDIZEM CD  Take 1 capsule (180 mg total) by mouth daily.     DSS 100 MG Caps  Take 100 mg by mouth 2 (two) times daily.     HYDROcodone-acetaminophen 5-325 MG per tablet  Commonly known as:  NORCO/VICODIN  Take one tablet by mouth every four hours as needed for moderate pain     lactose free nutrition Liqd  Take 237 mLs by mouth daily at 2 PM daily at 2 PM.     metoprolol tartrate 25 MG tablet  Commonly known as:  LOPRESSOR  Take 25 mg by mouth 2 (two) times daily.        No orders of the  defined types were placed in this encounter.    Immunization History  Administered Date(s) Administered  . Pneumococcal-Unspecified 10/11/2009  . Tdap 06/05/2011    History  Substance Use Topics  . Smoking status: Never Smoker   . Smokeless tobacco: Never Used  . Alcohol Use: No    Family history is noncontributory    Review of Systems  DATA OBTAINED: from patient; pt has no c/o GENERAL: Feels well no fevers, fatigue, appetite changes SKIN: No itching, rash or wounds EYES: No eye pain, redness, discharge EARS: No earache, tinnitus, change in hearing NOSE: No congestion, drainage or bleeding  MOUTH/THROAT: No mouth or tooth pain,  RESPIRATORY: No cough, wheezing, SOB CARDIAC: No chest pain, palpitations, lower extremity edema  GI: No abdominal pain, No N/V/D or constipation, No heartburn or reflux  GU: No dysuria, frequency or urgency, or incontinence  MUSCULOSKELETAL: No unrelieved bone/joint pain NEUROLOGIC: No headache, dizziness or focal weakness PSYCHIATRIC: No overt anxiety or sadness. Sleeps well. No behavior issue.   Filed Vitals:   04/18/13 1211  BP: 105/60  Pulse: 88  Temp: 97 F (36.1 C)   Resp: 18    Physical Exam  GENERAL APPEARANCE: Alert, mod conversant. Appropriately groomed. No acute distress.  SKIN: No diaphoresis rash, HEAD: Normocephalic, atraumatic  EYES: Conjunctiva/lids clear. Pupils round, reactive. EOMs intact.  EARS: External exam WNL, canals clear. Hearing grossly normal.  NOSE: No deformity or discharge.  MOUTH/THROAT: Lips w/o lesions.  RESPIRATORY: Breathing is even, unlabored. Lung sounds are clear   CARDIOVASCULAR: Heart RRR no murmurs, rubs or gallops. No peripheral edema.  GASTROINTESTINAL: Abdomen is soft, non-tender, not distended w/ normal bowel sounds GENITOURINARY: Bladder non tender, not distended  MUSCULOSKELETAL: No abnormal joints or musculature NEUROLOGIC: Oriented X3. Cranial nerves 2-12 grossly intact. Moves all extremities no tremor. PSYCHIATRIC: Mood and affect appropriate to situation, no behavioral issues  Patient Active Problem List   Diagnosis Date Noted  . Fall at home 04/06/2013  . Hypokalemia 04/06/2013  . Lumbar compression fracture 04/05/2013  . Atrial fibrillation with RVR 04/05/2013  . Orthostasis 06/12/2011  . Near syncope 06/12/2011  . HTN (hypertension) 11/20/2010  . Hyperlipidemia 11/20/2010    CBC    Component Value Date/Time   WBC 9.1 04/06/2013 0820   RBC 4.26 04/06/2013 0820   HGB 13.9 04/06/2013 0820   HCT 41.2 04/06/2013 0820   PLT 190 04/06/2013 0820   MCV 96.7 04/06/2013 0820   LYMPHSABS 1.3 06/11/2011 0845   MONOABS 0.5 06/11/2011 0845   EOSABS 0.1 06/11/2011 0845   BASOSABS 0.1 06/11/2011 0845    CMP     Component Value Date/Time   NA 142 04/08/2013 0305   K 4.0 04/08/2013 0305   CL 106 04/08/2013 0305   CO2 23 04/08/2013 0305   GLUCOSE 146* 04/08/2013 0305   BUN 21 04/08/2013 0305   CREATININE 0.51 04/08/2013 0305   CALCIUM 8.4 04/08/2013 0305   PROT 6.7 09/29/2011 0954   ALBUMIN 3.8 09/29/2011 0954   AST 18 09/29/2011 0954   ALT 10 09/29/2011 0954   ALKPHOS 54 09/29/2011 0954   BILITOT 0.4 09/29/2011  0954   GFRNONAA 78* 04/08/2013 0305   GFRAA >90 04/08/2013 0305    Assessment and Plan  Atrial fibrillation with RVR Rate controlled with lopressor and diltiazrm;prophylaxis with ASA 81 mg only;fall risk so no anti-coag  HTN (hypertension) Treated with diltizem and lopressor;pr has h/o low BP and orthostaic hypotension so it is better that BP runs on the  higher end  Lumbar compression fracture Pt fell at home;MRI subacute/acute fx L2 and acute transverse fx of sacrum at S4 requiring admit to SNF for OT/PT.    Margit Hanks, MD

## 2013-04-26 ENCOUNTER — Non-Acute Institutional Stay (SKILLED_NURSING_FACILITY): Payer: Federal, State, Local not specified - PPO | Admitting: Nurse Practitioner

## 2013-04-26 DIAGNOSIS — I951 Orthostatic hypotension: Secondary | ICD-10-CM

## 2013-04-26 DIAGNOSIS — K5900 Constipation, unspecified: Secondary | ICD-10-CM

## 2013-04-26 DIAGNOSIS — S32009A Unspecified fracture of unspecified lumbar vertebra, initial encounter for closed fracture: Secondary | ICD-10-CM

## 2013-04-26 DIAGNOSIS — S32000A Wedge compression fracture of unspecified lumbar vertebra, initial encounter for closed fracture: Secondary | ICD-10-CM

## 2013-04-26 DIAGNOSIS — I4891 Unspecified atrial fibrillation: Secondary | ICD-10-CM

## 2013-04-26 DIAGNOSIS — I10 Essential (primary) hypertension: Secondary | ICD-10-CM

## 2013-04-26 NOTE — Progress Notes (Signed)
Patient ID: [name], female   DOB: [birth year], 80 y.o.   MRN: [id number]    Nursing Home Location:  [facility]   Place of Service: SNF (31)  PCP: Egbert Garibaldi, NP  Allergies  Allergen Reactions  . Irbesartan Other (See Comments)    Caused heart to race  . Sulfa Drugs Cross Reactors Hives and Other (See Comments)    Almost passed out    Chief Complaint  Patient presents with  . Discharge Note    HPI:  80 year old female patient with pmh of hypertension and recurrent orthostatic hypotension, a fib with RVR, remote history of rectal cancer. Who went to the hospital after a fall and plain x-rays revealed compression fracture L1 of uncertain age is now at [facility] for ongoing rehab for strength and gait training. Patient currently doing well with therapy, now stable to discharge home with daughter and with home health. Pt will need wheelchair.   Review of Systems:  Review of Systems  Constitutional: Negative for fever, chills and malaise/fatigue.  HENT: Negative for congestion and sore throat.   Respiratory: Negative for cough and shortness of breath.   Cardiovascular: Negative for chest pain and palpitations.  Gastrointestinal: Negative for heartburn, abdominal pain, diarrhea and constipation.  Genitourinary: Negative for dysuria, urgency and frequency.  Musculoskeletal: Positive for back pain (ongoing but has improved- more in her buttocks region at this time).  Skin: Negative.   Neurological: Positive for weakness. Negative for dizziness, sensory change and headaches.  Psychiatric/Behavioral: Negative for depression.     Past Medical History  Diagnosis Date  . Hypertension   . MVP (mitral valve prolapse)     Not mentioned on 08/2005 echo however  . OP (osteoporosis)   . History of rectal cancer   . Fracture of femoral neck, right 2010  . Wrist fracture     left  . Dizziness   . Pneumonia     "once"  . Blood transfusion     "my own  blood"  . Headache(784.0)     "terrible while going thru menopause; none since then"  . Arthritis   . Anxiety     "now & then; I get over it"; denies RX  . Syncope 06/10/11    "fell; I've been having these spells; off & on; last time was maybe 2 yr ago"  . Atrial fibrillation     08/2005, normal EF at that time  . Tachyarrhythmia 06/11/11    burst of ST   Past Surgical History  Procedure Laterality Date  . Transthoracic echocardiogram  08/20/2005    EF 60%  . Colonoscopy    . Total hip arthroplasty      bilaterally  . Inguinal hernia repair      right  . Breast cyst excision      right  . Cataract extraction w/ intraocular lens  implant, bilateral    . Colectomy     Social History:   reports that she has never smoked. She has never used smokeless tobacco. She reports that she does not drink alcohol or use illicit drugs.  Family History  Problem Relation Age of Onset  . Heart disease Neg Hx     Medications: Patient's Medications  New Prescriptions   No medications on file  Previous Medications   ACETAMINOPHEN (TYLENOL) 500 MG TABLET    Take 500 mg by mouth daily as needed (pain).   ASPIRIN 81 MG CHEWABLE TABLET    Chew 1  tablet (81 mg total) by mouth daily.   CRANBERRY PO    Take 1 tablet by mouth daily.   DILTIAZEM (CARDIZEM CD) 180 MG 24 HR CAPSULE    Take 1 capsule (180 mg total) by mouth daily.   HYDROCODONE-ACETAMINOPHEN (NORCO/VICODIN) 5-325 MG PER TABLET    Take one tablet by mouth every four hours as needed for moderate pain   LACTOSE FREE NUTRITION (BOOST PLUS) LIQD    Take 237 mLs by mouth daily at 2 PM daily at 2 PM.   METOPROLOL TARTRATE (LOPRESSOR) 25 MG TABLET    Take 25 mg by mouth 2 (two) times daily.   NAPHAZOLINE HCL (CLEAR EYES OP)    Place 1 drop into both eyes at bedtime as needed (dry eyes/ itching).  Modified Medications   No medications on file  Discontinued Medications   No medications on file     Physical Exam:  Filed Vitals:   04/26/13  1443  BP: 126/62  Pulse: 70  Temp: 97 F (36.1 C)  Resp: 20    Physical Exam  Constitutional: She is oriented to person, place, and time and well-developed, well-nourished, and in no distress.  HENT:  Head: Normocephalic and atraumatic.  Right Ear: External ear normal.  Left Ear: External ear normal.  Nose: Nose normal.  Mouth/Throat: Oropharynx is clear and moist. No oropharyngeal exudate.  Eyes: Conjunctivae and EOM are normal. Pupils are equal, round, and reactive to light.  Neck: Normal range of motion. Neck supple.  Cardiovascular: Normal rate and normal heart sounds.  An irregular rhythm present.  Pulmonary/Chest: Effort normal and breath sounds normal. No respiratory distress.  Abdominal: Soft. Bowel sounds are normal. She exhibits no distension.  Musculoskeletal: She exhibits tenderness (to lower paraspinal muscle and along upper buttocks). She exhibits no edema.  Neurological: She is alert and oriented to person, place, and time.  Skin: Skin is warm and dry. No erythema.  Psychiatric: Affect normal.     Labs reviewed: Basic Metabolic Panel:  Recent Labs  16/10/96 0820 04/07/13 0347 04/07/13 1155 04/08/13 0305  NA 144 145  --  142  K 3.5* 4.0  --  4.0  CL 105 107  --  106  CO2 25 24  --  23  GLUCOSE 104* 110*  --  146*  BUN 14 25*  --  21  CREATININE 0.47* 0.59  --  0.51  CALCIUM 8.5 8.4 8.8 8.4   Liver Function Tests: No results found for this basename: AST, ALT, ALKPHOS, BILITOT, PROT, ALBUMIN,  in the last 8760 hours No results found for this basename: LIPASE, AMYLASE,  in the last 8760 hours No results found for this basename: AMMONIA,  in the last 8760 hours CBC:  Recent Labs  04/05/13 1600 04/06/13 0820  WBC 11.8* 9.1  HGB 13.9 13.9  HCT 41.4 41.2  MCV 97.9 96.7  PLT 183 190   Cardiac Enzymes: No results found for this basename: CKTOTAL, CKMB, CKMBINDEX, TROPONINI,  in the last 8760 hours BNP: No components found with this basename:  POCBNP,  CBG: No results found for this basename: GLUCAP,  in the last 8760 hours TSH:  Recent Labs  04/05/13 2255  TSH 1.470    Assessment/Plan 1. Orthostasis Reinforced recommendations for cautious mobilization after transitions from supine to standing  2. HTN (hypertension) Well controlled on metoprolol and Cardizem   3. Atrial fibrillation with RVR -stable; without episodes of RVR; followed up with cardiology who did not change treatment -no  on anticoagulation other than ASA due to freq fall history   4. constipation Had episode of pressure in lower abdomen, questioned UTI and urine was obtained but negative; she had large BM which improved all symptoms and they have not reoccured, currently on colace which I recommend to cont on discharge   4. Lumbar compression fracture IN hospital, MRI of the lumbar sacral spine revealed acute/subacute superior endplate fracture L2 without retropulsion of bone as well as a leftward disc protrusion at L 4-5. Sacral imaging revealed acute transverse fracture of the sacrum at S4 level which is nondisplaced. Pt states pain is better but still there occasionally and controlled on tylenol and NORCO pt is stable for discharge-will need PT/OTper home health. DME needed includes a wheelchair. Rx written.  will need to follow up with PCP within 2 weeks.

## 2013-05-03 ENCOUNTER — Telehealth: Payer: Self-pay | Admitting: Cardiovascular Disease

## 2013-05-03 NOTE — Telephone Encounter (Signed)
PER Dr Nahser/ pcp should sign for lumbar fracture/ PT orders. She verbalized understanding

## 2013-05-03 NOTE — Telephone Encounter (Signed)
New message    [facility] Dr. Lyn HollingsheadAlexander referral patient to [facility].  However, he/ she does not want to sign off on the order.    Twice a week for 6 weeks.

## 2013-05-09 ENCOUNTER — Encounter (HOSPITAL_COMMUNITY): Payer: Self-pay | Admitting: Emergency Medicine

## 2013-05-09 ENCOUNTER — Emergency Department (HOSPITAL_COMMUNITY): Payer: Medicare Other

## 2013-05-09 ENCOUNTER — Inpatient Hospital Stay (HOSPITAL_COMMUNITY)
Admission: EM | Admit: 2013-05-09 | Discharge: 2013-05-12 | DRG: 195 | Disposition: A | Discharge status: Home Health | Payer: Medicare Other | Attending: Internal Medicine | Admitting: Internal Medicine

## 2013-05-09 DIAGNOSIS — J189 Pneumonia, unspecified organism: Principal | ICD-10-CM | POA: Diagnosis present

## 2013-05-09 DIAGNOSIS — E785 Hyperlipidemia, unspecified: Secondary | ICD-10-CM | POA: Diagnosis present

## 2013-05-09 DIAGNOSIS — Z66 Do not resuscitate: Secondary | ICD-10-CM | POA: Diagnosis present

## 2013-05-09 DIAGNOSIS — Z888 Allergy status to other drugs, medicaments and biological substances status: Secondary | ICD-10-CM

## 2013-05-09 DIAGNOSIS — I10 Essential (primary) hypertension: Secondary | ICD-10-CM | POA: Diagnosis present

## 2013-05-09 DIAGNOSIS — Z882 Allergy status to sulfonamides status: Secondary | ICD-10-CM

## 2013-05-09 DIAGNOSIS — Y92009 Unspecified place in unspecified non-institutional (private) residence as the place of occurrence of the external cause: Secondary | ICD-10-CM

## 2013-05-09 DIAGNOSIS — Z9181 History of falling: Secondary | ICD-10-CM

## 2013-05-09 DIAGNOSIS — Z96649 Presence of unspecified artificial hip joint: Secondary | ICD-10-CM

## 2013-05-09 DIAGNOSIS — Z85048 Personal history of other malignant neoplasm of rectum, rectosigmoid junction, and anus: Secondary | ICD-10-CM

## 2013-05-09 DIAGNOSIS — M129 Arthropathy, unspecified: Secondary | ICD-10-CM | POA: Diagnosis present

## 2013-05-09 DIAGNOSIS — I4891 Unspecified atrial fibrillation: Secondary | ICD-10-CM | POA: Diagnosis present

## 2013-05-09 DIAGNOSIS — W19XXXA Unspecified fall, initial encounter: Secondary | ICD-10-CM

## 2013-05-09 LAB — CBC
HCT: 40 % (ref 36.0–46.0)
HEMOGLOBIN: 13.6 g/dL (ref 12.0–15.0)
MCH: 32.9 pg (ref 26.0–34.0)
MCHC: 34 g/dL (ref 30.0–36.0)
MCV: 96.6 fL (ref 78.0–100.0)
Platelets: 153 10*3/uL (ref 150–400)
RBC: 4.14 MIL/uL (ref 3.87–5.11)
RDW: 13.1 % (ref 11.5–15.5)
WBC: 5.1 10*3/uL (ref 4.0–10.5)

## 2013-05-09 LAB — BASIC METABOLIC PANEL
BUN: 18 mg/dL (ref 6–23)
CO2: 25 mEq/L (ref 19–32)
Calcium: 8.7 mg/dL (ref 8.4–10.5)
Chloride: 103 mEq/L (ref 96–112)
Creatinine, Ser: 0.48 mg/dL — ABNORMAL LOW (ref 0.50–1.10)
GFR, EST NON AFRICAN AMERICAN: 80 mL/min — AB (ref >90)
Glucose, Bld: 147 mg/dL — ABNORMAL HIGH (ref 70–99)
POTASSIUM: 4 meq/L (ref 3.7–5.3)
SODIUM: 142 meq/L (ref 137–147)

## 2013-05-09 LAB — URINALYSIS, ROUTINE W REFLEX MICROSCOPIC
Bilirubin Urine: NEGATIVE
Glucose, UA: NEGATIVE mg/dL
Ketones, ur: 15 mg/dL — AB
Nitrite: NEGATIVE
Protein, ur: 30 mg/dL — AB
Specific Gravity, Urine: 1.023 (ref 1.005–1.030)
Urobilinogen, UA: 1 mg/dL (ref 0.0–1.0)
pH: 6.5 (ref 5.0–8.0)

## 2013-05-09 LAB — I-STAT TROPONIN, ED: TROPONIN I, POC: 0.01 ng/mL (ref 0.00–0.08)

## 2013-05-09 LAB — URINE MICROSCOPIC-ADD ON

## 2013-05-09 LAB — INFLUENZA PANEL BY PCR (TYPE A & B)
H1N1 flu by pcr: NOT DETECTED
INFLAPCR: NEGATIVE
Influenza B By PCR: NEGATIVE

## 2013-05-09 LAB — STREP PNEUMONIAE URINARY ANTIGEN: Strep Pneumo Urinary Antigen: NEGATIVE

## 2013-05-09 LAB — MRSA PCR SCREENING: MRSA by PCR: NEGATIVE

## 2013-05-09 LAB — PRO B NATRIURETIC PEPTIDE: Pro B Natriuretic peptide (BNP): 2605 pg/mL — ABNORMAL HIGH (ref 0–450)

## 2013-05-09 MED ORDER — ENOXAPARIN SODIUM 40 MG/0.4ML ~~LOC~~ SOLN
40.0000 mg | SUBCUTANEOUS | Status: DC
  Administered 2013-05-09 – 2013-05-11 (×3): 40 mg via SUBCUTANEOUS
  Filled 2013-05-09 (×4): qty 0.4

## 2013-05-09 MED ORDER — ACETAMINOPHEN 325 MG PO TABS: 650.0000 mg | ORAL_TABLET | Freq: Every day | ORAL | Status: DC | PRN

## 2013-05-09 MED ORDER — METOPROLOL TARTRATE 25 MG PO TABS
25.0000 mg | ORAL_TABLET | Freq: Two times a day (BID) | ORAL | Status: DC
  Administered 2013-05-09 – 2013-05-12 (×6): 25 mg via ORAL
  Filled 2013-05-09 (×8): qty 1

## 2013-05-09 MED ORDER — DILTIAZEM HCL ER COATED BEADS 180 MG PO CP24
180.0000 mg | ORAL_CAPSULE | Freq: Every day | ORAL | Status: DC
  Administered 2013-05-09 – 2013-05-12 (×4): 180 mg via ORAL
  Filled 2013-05-09 (×5): qty 1

## 2013-05-09 MED ORDER — VANCOMYCIN HCL 500 MG IV SOLR
500.0000 mg | INTRAVENOUS | Status: DC
  Administered 2013-05-09 – 2013-05-11 (×3): 500 mg via INTRAVENOUS
  Filled 2013-05-09 (×4): qty 500

## 2013-05-09 MED ORDER — VANCOMYCIN HCL 10 G IV SOLR
1250.0000 mg | Freq: Once | INTRAVENOUS | Status: DC
  Filled 2013-05-09: qty 1250

## 2013-05-09 MED ORDER — DEXTROSE 5 % IV SOLN
1.0000 g | INTRAVENOUS | Status: DC
  Administered 2013-05-09 – 2013-05-11 (×3): 1 g via INTRAVENOUS
  Filled 2013-05-09 (×4): qty 1

## 2013-05-09 MED ORDER — PIPERACILLIN-TAZOBACTAM 3.375 G IVPB 30 MIN
3.3750 g | Freq: Once | INTRAVENOUS | Status: DC
  Administered 2013-05-09: 3.375 g via INTRAVENOUS
  Filled 2013-05-09: qty 50

## 2013-05-09 MED ORDER — DILTIAZEM HCL 25 MG/5ML IV SOLN
10.0000 mg | Freq: Once | INTRAVENOUS | Status: AC
  Administered 2013-05-09: 10 mg via INTRAVENOUS
  Filled 2013-05-09: qty 5

## 2013-05-09 MED ORDER — SODIUM CHLORIDE 0.9 % IV BOLUS (SEPSIS)
1000.0000 mL | Freq: Once | INTRAVENOUS | Status: AC
  Administered 2013-05-09: 1000 mL via INTRAVENOUS

## 2013-05-09 NOTE — H&P (Signed)
History and Physical    Amber GLORE Berry:[id number] DOB: Jun 27, 1914 DOA: 05/09/2013  Referring physician: Dr. Jeraldine Loots PCP: Egbert Garibaldi, NP  Specialists: none  Chief Complaint: cough  HPI: [name] is a 80 y.o. female has a past medical history significant for hypertension, hyperlipidemia, recurrent falls, atrial fibrillation, comes to the emergency room with a chief complaint of cough. This has been going on for couple of days, and she saw her PCP today, and because she was found to be tachycardic she was directed to the emergency room. She was recently hospitalized about a month ago after a fall, and she was discharged to a rehabilitation where she stayed up until about a week ago. She has been home since and has been working with physical therapy, and there are no reported falls since her last hospitalization. She denies any chest pain currently she denies any shortness of breath. She endorses subjective fever and chills. She is now gone and her pain, endorses mild nausea without vomiting. She has no diarrhea. She endorses sick contacts in one of her friends was coughing really bad. She has no myalgias. She endorses sore throat for the past couple of days. In the emergency room, she was found to be in atrial fibrillation with RVR with heart rates of 130s to 140s. She received IV diltiazem 10 mg and when I evaluated the patient and her heart rate was in the 90s to 100.  Review of Systems: As per history of present illness, otherwise negative  Past Medical History  Diagnosis Date  . Hypertension   . MVP (mitral valve prolapse)     Not mentioned on 08/2005 echo however  . OP (osteoporosis)   . History of rectal cancer   . Fracture of femoral neck, right 2010  . Wrist fracture     left  . Dizziness   . Pneumonia     "once"  . Blood transfusion     "my own blood"  . Headache(784.0)     "terrible while going thru menopause; none since then"  . Arthritis   . Anxiety    "now & then; I get over it"; denies RX  . Syncope 06/10/11    "fell; I've been having these spells; off & on; last time was maybe 2 yr ago"  . Atrial fibrillation     08/2005, normal EF at that time  . Tachyarrhythmia 06/11/11    burst of ST   Past Surgical History  Procedure Laterality Date  . Transthoracic echocardiogram  08/20/2005    EF 60%  . Colonoscopy    . Total hip arthroplasty      bilaterally  . Inguinal hernia repair      right  . Breast cyst excision      right  . Cataract extraction w/ intraocular lens  implant, bilateral    . Colectomy     Social History:  reports that she has never smoked. She has never used smokeless tobacco. She reports that she does not drink alcohol or use illicit drugs.  Allergies  Allergen Reactions  . Sulfa Drugs Cross Reactors Hives and Other (See Comments)    Almost passed out  . Irbesartan Other (See Comments)    Caused heart to race    Family History  Problem Relation Age of Onset  . Heart disease Neg Hx     Prior to Admission medications   Medication Sig Start Date End Date Taking? Authorizing Provider  acetaminophen (TYLENOL) 500  MG tablet Take 1,000 mg by mouth daily as needed for moderate pain (pain).    Yes Historical Provider, MD  CRANBERRY PO Take 1 tablet by mouth daily.   Yes Historical Provider, MD  diltiazem (CARDIZEM CD) 180 MG 24 hr capsule Take 1 capsule (180 mg total) by mouth daily. 04/08/13  Yes Ripudeep Jenna LuoK Rai, MD  lactose free nutrition (BOOST PLUS) LIQD Take 237 mLs by mouth daily at 2 PM daily at 2 PM. 04/08/13  Yes Ripudeep K Rai, MD  loratadine (CLARITIN) 10 MG tablet Take 10 mg by mouth daily.   Yes Historical Provider, MD  metoprolol tartrate (LOPRESSOR) 25 MG tablet Take 25 mg by mouth 2 (two) times daily.   Yes Historical Provider, MD  Naphazoline HCl (CLEAR EYES OP) Place 1 drop into both eyes at bedtime as needed (dry eyes/ itching).   Yes Historical Provider, MD  NON FORMULARY Take 1.5 mLs by mouth daily  as needed ("[facility] cold care").   Yes Historical Provider, MD  vitamin C (ASCORBIC ACID) 500 MG tablet Take 500 mg by mouth daily as needed (for immune support).   Yes Historical Provider, MD   Physical Exam: Filed Vitals:   05/09/13 1326 05/09/13 1449 05/09/13 1500  BP: 159/96 170/111 158/108  Pulse: 120 139 115  Temp: 98.7 F (37.1 C)    TempSrc: Oral    Resp: 18 20 24   Height: 5\' 5"  (1.651 m)    Weight: 54.432 kg (120 lb)    SpO2: 96% 95% 95%     General:  No apparent distress  Eyes: no scleral icterus  ENT: moist oropharynx  Neck: supple, no JVD  Cardiovascular: Irregularly irregular, no murmurs  Respiratory: Coarse breath sounds bilaterally,  Abdomen: soft, non tender to palpation, positive bowel sounds, no guarding, no rebound  Skin: no rashes  Musculoskeletal: no peripheral edema  Psychiatric: normal mood and affect  Neurologic: grossly nonfocal  Labs on Admission:  Basic Metabolic Panel:  Recent Labs Lab 05/09/13 1328  NA 142  K 4.0  CL 103  CO2 25  GLUCOSE 147*  BUN 18  CREATININE 0.48*  CALCIUM 8.7   CBC:  Recent Labs Lab 05/09/13 1328  WBC 5.1  HGB 13.6  HCT 40.0  MCV 96.6  PLT 153   BNP (last 3 results)  Recent Labs  05/09/13 1328  PROBNP 2605.0*   Radiological Exams on Admission: Dg Chest 2 View  05/09/2013   CLINICAL DATA:  One-week history of cough and congestion  EXAM: CHEST  2 VIEW  COMPARISON:  PA and lateral chest x-ray Jun 11, 2011  FINDINGS: The lungs are well-expanded. There are confluent alveolar infiltrates in the left lower lobe which are new. There is no significant pleural effusion. The cardiac silhouette is normal in size. The pulmonary vascularity is not engorged. The mediastinum is normal in width. There is small amount of apical pleural thickening bilaterally. There is gentle S-shaped scoliosis. The thoracic spine. There is high-grade wedge compression of the body of approximately T1. Mild compression of T2 is  present as well.  IMPRESSION: 1. The findings are consistent with left lower lobe pneumonia. Followup films following therapy are recommended to assure clearing. 2. There are wedge compressions of the bodies of approximately L1 and L2.   Electronically Signed   By: David  [place]   On: 05/09/2013 14:14    EKG: Independently reviewed.  Assessment/Plan Active Problems:   HTN (hypertension)   Hyperlipidemia   Atrial fibrillation with RVR  HCAP (healthcare-associated pneumonia)   HCAP - patient with productive cough and a chest x-ray consistent with left lower lobe pneumonia. Given recent hospitalization and rehabilitation stay, will start vancomycin and cefepime. Obtain blood cultures and sputum culture. Rule out influenza. Atrial fibrillation with RVR -  patient responded really well to a one-time dose of IV diltiazem. Will start her home dose of oral diltiazem which she has missed this morning. She did take her metoprolol. Admit to telemetry. This is likely to improve with treatment for #1. BNP elevated, likely due to #1 and #2, she recently had a 2-D echo on 04/06/2013 which showed normal ejection fraction to 60-65% without any mention of diastolic dysfunction, would not repeat a 2-D echo for now. Hypertension - patient hypertensive in the ED, restart diltiazem. Closely monitored.  Diet: Heart healthy  Fluids: 1000 cc normal saline over 10 hours  DVT Prophylaxis: Lovenox   Code Status:  DNR Family Communication:  daughter at the bedside  Disposition Plan: inpatient  Time spent: 107  This note has been created with Education officer, environmental. Any transcriptional errors are unintentional.   Costin M. Elvera Lennox, MD [facility] Pager [phone]  If 7PM-7AM, please contact night-coverage [email or web address] Password [facility] 05/09/2013, 3:56 PM

## 2013-05-09 NOTE — ED Provider Notes (Signed)
CSN: [id number]     Arrival date & time 05/09/13  1303 History   First MD Initiated Contact with Patient 05/09/13 1500     Chief Complaint  Patient presents with  . Atrial Fibrillation     (Consider location/radiation/quality/duration/timing/severity/associated sxs/prior Treatment) Patient is a 80 y.o. female presenting with cough. The history is provided by the patient. No language interpreter was used.  Cough Cough characteristics:  Productive Sputum characteristics:  Yellow Severity:  Moderate Onset quality:  Gradual Duration:  4 days Timing:  Constant Progression:  Worsening Chronicity:  New Smoker: no   Context: upper respiratory infection   Context comment:  Pt recently in hospital 3 weeks ago for fall and A fiv RVR, now with cough for 4 days, denies f/c, chest pain, SOB Relieved by:  Nothing Worsened by:  Nothing tried Ineffective treatments:  None tried Associated symptoms: no chest pain, no diaphoresis, no eye discharge, no fever, no rash, no rhinorrhea, no shortness of breath and no wheezing   Risk factors comment:  Recent admission to hospital   Past Medical History  Diagnosis Date  . Hypertension   . MVP (mitral valve prolapse)     Not mentioned on 08/2005 echo however  . OP (osteoporosis)   . History of rectal cancer   . Fracture of femoral neck, right 2010  . Wrist fracture     left  . Dizziness   . Pneumonia     "once"  . Blood transfusion     "my own blood"  . Headache(784.0)     "terrible while going thru menopause; none since then"  . Arthritis   . Anxiety     "now & then; I get over it"; denies RX  . Syncope 06/10/11    "fell; I've been having these spells; off & on; last time was maybe 2 yr ago"  . Atrial fibrillation     08/2005, normal EF at that time  . Tachyarrhythmia 06/11/11    burst of ST   Past Surgical History  Procedure Laterality Date  . Transthoracic echocardiogram  08/20/2005    EF 60%  . Colonoscopy    . Total hip arthroplasty       bilaterally  . Inguinal hernia repair      right  . Breast cyst excision      right  . Cataract extraction w/ intraocular lens  implant, bilateral    . Colectomy     Family History  Problem Relation Age of Onset  . Heart disease Neg Hx    History  Substance Use Topics  . Smoking status: Never Smoker   . Smokeless tobacco: Never Used  . Alcohol Use: No   OB History   Grav Para Term Preterm Abortions TAB SAB Ect Mult Living                 Review of Systems  Constitutional: Negative for fever, diaphoresis, activity change and appetite change.  HENT: Negative for congestion and rhinorrhea.   Eyes: Negative for discharge, redness and itching.  Respiratory: Positive for cough. Negative for shortness of breath and wheezing.   Cardiovascular: Positive for palpitations. Negative for chest pain.  Gastrointestinal: Positive for nausea. Negative for vomiting, diarrhea and constipation.  Genitourinary: Negative for dysuria, decreased urine volume and difficulty urinating.  Skin: Negative for rash.  Neurological: Negative for syncope and weakness.      Allergies  Sulfa drugs cross reactors and Irbesartan  Home Medications   Current Outpatient Rx  Name  Route  Sig  Dispense  Refill  . acetaminophen (TYLENOL) 500 MG tablet   Oral   Take 1,000 mg by mouth daily as needed for moderate pain (pain).          . CRANBERRY PO   Oral   Take 1 tablet by mouth daily.         Marland Kitchen. diltiazem (CARDIZEM CD) 180 MG 24 hr capsule   Oral   Take 1 capsule (180 mg total) by mouth daily.         Marland Kitchen. lactose free nutrition (BOOST PLUS) LIQD   Oral   Take 237 mLs by mouth daily at 2 PM daily at 2 PM.      0   . loratadine (CLARITIN) 10 MG tablet   Oral   Take 10 mg by mouth daily.         . metoprolol tartrate (LOPRESSOR) 25 MG tablet   Oral   Take 25 mg by mouth 2 (two) times daily.         . Naphazoline HCl (CLEAR EYES OP)   Both Eyes   Place 1 drop into both eyes at  bedtime as needed (dry eyes/ itching).         . NON FORMULARY   Oral   Take 1.5 mLs by mouth daily as needed ("[facility] cold care").         . vitamin C (ASCORBIC ACID) 500 MG tablet   Oral   Take 500 mg by mouth daily as needed (for immune support).          BP 157/79  Pulse 112  Temp(Src) 99.6 F (37.6 C) (Oral)  Resp 26  Ht 5\' 5"  (1.651 m)  Wt 120 lb (54.432 kg)  BMI 19.97 kg/m2  SpO2 93% Physical Exam  Constitutional: She is oriented to person, place, and time. She appears well-developed and well-nourished. No distress.  NAD, resting, normal oxygen level  HENT:  Head: Normocephalic and atraumatic.  Mouth/Throat: Oropharynx is clear and moist.  Eyes: Conjunctivae and EOM are normal. Pupils are equal, round, and reactive to light. Right eye exhibits no discharge. Left eye exhibits no discharge. No scleral icterus.  Neck: Normal range of motion. Neck supple.  Cardiovascular: Intact distal pulses.  Exam reveals no gallop and no friction rub.   No murmur heard. irreg irreg, tachy to 120-140  Pulmonary/Chest: Effort normal and breath sounds normal. No respiratory distress. She has no wheezes. She has no rales.  Abdominal: Soft. She exhibits no distension and no mass. There is no tenderness.  Musculoskeletal: Normal range of motion.  Neurological: She is alert and oriented to person, place, and time. No cranial nerve deficit. She exhibits normal muscle tone. Coordination normal.  Skin: She is not diaphoretic.    ED Course  Procedures (including critical care time) Labs Review Labs Reviewed  BASIC METABOLIC PANEL - Abnormal; Notable for the following:    Glucose, Bld 147 (*)    Creatinine, Ser 0.48 (*)    GFR calc non Af Amer 80 (*)    All other components within normal limits  PRO B NATRIURETIC PEPTIDE - Abnormal; Notable for the following:    Pro B Natriuretic peptide (BNP) 2605.0 (*)    All other components within normal limits  CULTURE, BLOOD (ROUTINE X 2)   CULTURE, BLOOD (ROUTINE X 2)  CULTURE, EXPECTORATED SPUTUM-ASSESSMENT  GRAM STAIN  CBC  LEGIONELLA ANTIGEN, URINE  STREP PNEUMONIAE URINARY ANTIGEN  INFLUENZA PANEL BY  PCR (TYPE A & B, H1N1)  URINALYSIS, ROUTINE W REFLEX MICROSCOPIC  I-STAT TROPOININ, ED   Imaging Review Dg Chest 2 View  05/09/2013   CLINICAL DATA:  One-week history of cough and congestion  EXAM: CHEST  2 VIEW  COMPARISON:  PA and lateral chest x-ray Jun 11, 2011  FINDINGS: The lungs are well-expanded. There are confluent alveolar infiltrates in the left lower lobe which are new. There is no significant pleural effusion. The cardiac silhouette is normal in size. The pulmonary vascularity is not engorged. The mediastinum is normal in width. There is small amount of apical pleural thickening bilaterally. There is gentle S-shaped scoliosis. The thoracic spine. There is high-grade wedge compression of the body of approximately T1. Mild compression of T2 is present as well.  IMPRESSION: 1. The findings are consistent with left lower lobe pneumonia. Followup films following therapy are recommended to assure clearing. 2. There are wedge compressions of the bodies of approximately L1 and L2.   Electronically Signed   By: David  [place]   On: 05/09/2013 14:14     EKG Interpretation   Date/Time:  Monday May 09 2013 13:22:47 EDT Ventricular Rate:  128 PR Interval:    QRS Duration: 80 QT Interval:  320 QTC Calculation: 467 R Axis:   30 Text Interpretation:  new  Atrial fibrillation with rapid ventricular  response Nonspecific ST and T wave abnormality Confirmed by Anitra Lauth  MD,  Alphonzo Lemmings ([id number]) on 05/09/2013 2:37:08 PM      MDM   MDM: 80 y/o WF w/ PMHx of HTN, remote rectal ca, recent admission for afib rvr w/ cc: of productive cough for 4 days. Nauseated, no SOB or chest pain. No fever. Feels sick. AFVSS, well appearing, no resp distress. Normal oxygen. Lungs clear. Tachy irreg irreg c/w afib rvr. CXR shows LLL PNA, likely  trigger for A fib. Normal trop. Will treat with Zosyn and Vanc for HCAP as recently in hospital. Will give Dilt for A fib. Will admit to medicine. Care of case d/w my attending.  Final diagnoses:  HCAP (healthcare-associated pneumonia)    Admit  Pilar Jarvis, MD 05/09/13 [phone]

## 2013-05-09 NOTE — ED Provider Notes (Signed)
This patient was seen and evaluated in conjunction with the resident physician, Dr. Marcha SoldersBrtalik.  The documentation accurately reflects the patient's ED evaluation.  On my exam, the patient was sitting upright, in NAD.  She remains tachycardic after diltiazem, but was otherwise hemodynamically stable. With PNA, afib, CHF she required admission for further E/M.  I saw the ECG, agree with the interpretation.   Gerhard Munchobert Arbor Cohen, MD 05/09/13 [phone]

## 2013-05-09 NOTE — ED Notes (Signed)
Pt to department from PCP office- reports that she went there today for nausea and vomiting. States that when they were at the office she was told she was in a-fib. Hx of the same. Takes Cardizem. Denies any pain at this time. Slightly SOB

## 2013-05-09 NOTE — Progress Notes (Signed)
ANTIBIOTIC CONSULT NOTE - INITIAL  Pharmacy Consult for vancomycin; may adjust abx based on renal function Indication: pneumonia  Allergies  Allergen Reactions  . Sulfa Drugs Cross Reactors Hives and Other (See Comments)    Almost passed out  . Irbesartan Other (See Comments)    Caused heart to race    Patient Measurements: Height: 5\' 5"  (165.1 cm) Weight: 120 lb (54.432 kg) IBW/kg (Calculated) : 57   Vital Signs: Temp: 98.7 F (37.1 C) (03/30 1326) Temp src: Oral (03/30 1326) BP: 158/108 mmHg (03/30 1500) Pulse Rate: 115 (03/30 1500) Intake/Output from previous day:   Intake/Output from this shift:    Labs:  Recent Labs  05/09/13 1328  WBC 5.1  HGB 13.6  PLT 153  CREATININE 0.48*   Estimated Creatinine Clearance: 34.5 ml/min (by C-G formula based on Cr of 0.48). No results found for this basename: VANCOTROUGH, VANCOPEAK, VANCORANDOM, GENTTROUGH, GENTPEAK, GENTRANDOM, TOBRATROUGH, TOBRAPEAK, TOBRARND, AMIKACINPEAK, AMIKACINTROU, AMIKACIN,  in the last 72 hours   Microbiology: No results found for this or any previous visit (from the past 720 hour(s)).  Medical History: Past Medical History  Diagnosis Date  . Hypertension   . MVP (mitral valve prolapse)     Not mentioned on 08/2005 echo however  . OP (osteoporosis)   . History of rectal cancer   . Fracture of femoral neck, right 2010  . Wrist fracture     left  . Dizziness   . Pneumonia     "once"  . Blood transfusion     "my own blood"  . Headache(784.0)     "terrible while going thru menopause; none since then"  . Arthritis   . Anxiety     "now & then; I get over it"; denies RX  . Syncope 06/10/11    "fell; I've been having these spells; off & on; last time was maybe 2 yr ago"  . Atrial fibrillation     08/2005, normal EF at that time  . Tachyarrhythmia 06/11/11    burst of ST    Assessment: 89 YOF brought in from her PCP office with n/v and cough. Was recently hospitalized and then discharged  to rehab where she stayed until about a week ago. CXR shows LLL PNA.  Received a dose of Zosyn at 1527 this afternoon. Now to continue on cefepime and vancomycin for an 8 day course. SCr 0.5mg /dL with est CrCl ~16XW/RUE~35mL/min. WBC 5.1 and patient is currently afebrile.  Goal of Therapy:  Vancomycin trough level 15-20 mcg/ml  Plan:  1. Change cefepime dose to 1g IV q24 hr x8 days starting today 2. Vancomycin 500mg  IV q24h x8 days 3. Follow clinical progression, c/s, renal function and [facility]  Lamonte Hartt D. Renise Gillies, PharmD, BCPS Clinical Pharmacist Pager: [phone] 05/09/2013 4:12 PM

## 2013-05-10 DIAGNOSIS — I4891 Unspecified atrial fibrillation: Secondary | ICD-10-CM

## 2013-05-10 DIAGNOSIS — E785 Hyperlipidemia, unspecified: Secondary | ICD-10-CM

## 2013-05-10 DIAGNOSIS — I10 Essential (primary) hypertension: Secondary | ICD-10-CM

## 2013-05-10 DIAGNOSIS — J189 Pneumonia, unspecified organism: Principal | ICD-10-CM

## 2013-05-10 DIAGNOSIS — Y92009 Unspecified place in unspecified non-institutional (private) residence as the place of occurrence of the external cause: Secondary | ICD-10-CM

## 2013-05-10 DIAGNOSIS — W19XXXA Unspecified fall, initial encounter: Secondary | ICD-10-CM

## 2013-05-10 LAB — BASIC METABOLIC PANEL
BUN: 13 mg/dL (ref 6–23)
CALCIUM: 7.9 mg/dL — AB (ref 8.4–10.5)
CO2: 24 meq/L (ref 19–32)
Chloride: 103 mEq/L (ref 96–112)
Creatinine, Ser: 0.42 mg/dL — ABNORMAL LOW (ref 0.50–1.10)
GFR calc Af Amer: >90 mL/min (ref >90)
GFR calc non Af Amer: 83 mL/min — ABNORMAL LOW (ref >90)
GLUCOSE: 90 mg/dL (ref 70–99)
Potassium: 3.5 mEq/L — ABNORMAL LOW (ref 3.7–5.3)
SODIUM: 139 meq/L (ref 137–147)

## 2013-05-10 LAB — LEGIONELLA ANTIGEN, URINE: Legionella Antigen, Urine: NEGATIVE

## 2013-05-10 LAB — CBC
HCT: 35.9 % — ABNORMAL LOW (ref 36.0–46.0)
HEMOGLOBIN: 12 g/dL (ref 12.0–15.0)
MCH: 32.3 pg (ref 26.0–34.0)
MCHC: 33.4 g/dL (ref 30.0–36.0)
MCV: 96.5 fL (ref 78.0–100.0)
PLATELETS: 146 10*3/uL — AB (ref 150–400)
RBC: 3.72 MIL/uL — AB (ref 3.87–5.11)
RDW: 13 % (ref 11.5–15.5)
WBC: 5.5 10*3/uL (ref 4.0–10.5)

## 2013-05-10 NOTE — Evaluation (Signed)
Occupational Therapy Evaluation Patient Details Name: BALEIGH RENNAKER MRN: [id number] DOB: 13-Jul-1914 Today's Date: 05/10/2013    History of Present Illness HPI: LATIKA KRONICK is a 80 y.o. female has a past medical history significant for hypertension, hyperlipidemia, recurrent falls, atrial fibrillation, comes to the emergency room with a chief complaint of cough.   Clinical Impression   Pt demos decline in function with ADLs and ADL mobility safety and would benefit from acute OT services to increase level of function and safety. Pt lives with her daughter who has been and  is able to assist her 24/7 per pt report . Pt states that she was receiving HH PT PTA  Follow Up Recommendations  No OT follow up;Supervision/Assistance - 24 hour    Equipment Recommendations   none   Recommendations for Other Services  PT consult     Precautions / Restrictions Precautions Precautions: Fall Restrictions Weight Bearing Restrictions: No      Mobility Bed Mobility Overal bed mobility: Needs Assistance Bed Mobility: Supine to Sit;Sit to Supine     Supine to sit: Supervision Sit to supine: Supervision      Transfers Overall transfer level: Needs assistance Equipment used: Rolling walker (2 wheeled) Transfers: Sit to/from Stand Sit to Stand: Min assist         General transfer comment: cues for hand placement, pt wanting to stand by herself without assist. Pt abe to stand at [facility] for pericare/hygiene assisted by OT    Balance Overall balance assessment: Needs assistance Sitting-balance support: No upper extremity supported;Feet supported Sitting balance-Leahy Scale: Good     Standing balance support: Single extremity supported;Bilateral upper extremity supported;During functional activity Standing balance-Leahy Scale: Fair                      ADL   Grooming: Wash/dry hands;Wash/dry face;Standing   Upper Body Dressing : Supervision/safety;Set up;Sitting Lower Body  Bathing: Sitting/lateral leans;Sit to/from stand;Maximal assistance Lower Body Dressing: Maximal assistance;Total assistance Toilet Transfer: Minimal assistance;BSC Toileting- Clothing Manipulation and Hygiene: Minimal assistance;Sit to/from stand     General ADL Comments: Pt abe to stand at [facility] for pericare/hygiene assisted by OT     Vision  wears glasses all the time                              Pertinent Vitals/Pain No c/o pain, VSS     Hand Dominance Right   Extremity/Trunk Assessment Upper Extremity Assessment Upper Extremity Assessment: Generalized weakness   Lower Extremity Assessment Lower Extremity Assessment: Defer to PT evaluation       Communication Communication Communication: No difficulties   Cognition Arousal/Alertness: Awake/alert Behavior During Therapy: WFL for tasks assessed/performed Overall Cognitive Status: Within Functional Limits for tasks assessed                                  Home Living Family/patient expects to be discharged to:: Private residence Living Arrangements: Children Available Help at Discharge: Family;Friend(s) Type of Home: House Home Access: Stairs to enter     Home Layout: One level     Bathroom Shower/Tub: Arts development officer Toilet: Handicapped height     Home Equipment: Environmental consultant - 2 wheels;Adaptive equipment;Shower Engineering geologist: Reacher        Prior Functioning/Environment Level of Independence: Needs assistance  Gait / Transfers Assistance Needed: pt  reports she ambulates with RW in house  ADL's / Homemaking Assistance Needed: dtr assists pt with LB bathing and dressing        OT Diagnosis:  Generalized weakness   OT Problem List: Decreased strength;Decreased activity tolerance;Decreased safety awareness;Impaired balance (sitting and/or standing)   OT Treatment/Interventions: Self-care/ADL training;Therapeutic exercise;Patient/family education;Therapeutic  activities;DME and/or AE instruction    OT Goals(Current goals can be found in the care plan section) Acute Rehab OT Goals Patient Stated Goal: to go back home with her daughter OT Goal Formulation: With patient Time For Goal Achievement: 05/17/13 Potential to Achieve Goals: Good ADL Goals Pt Will Perform Grooming: with set-up;with supervision;standing Pt Will Perform Upper Body Bathing: with set-up;with modified independence;sitting Pt Will Perform Lower Body Bathing: with mod assist;with min assist;sitting/lateral leans;sit to/from stand Pt Will Perform Upper Body Dressing: with set-up;with modified independence;sitting Pt Will Transfer to Toilet: with min guard assist;with supervision;ambulating;regular height toilet;bedside commode;grab bars (3 in 1 over toilet) Pt Will Perform Toileting - Clothing Manipulation and hygiene: with min guard assist;with supervision;sitting/lateral leans;sit to/from stand Pt Will Perform Tub/Shower Transfer: with min assist;with min guard assist;shower seat;grab bars  OT Frequency: Min 2X/week   Barriers to D/C:    none       End of Session: Equipment Utilized During Treatment: Gait belt;Rolling walker;Other (comment) (BSC)  Activity Tolerance: Patient limited by fatigue Patient left: in bed;with call bell/phone within reach;with bed alarm set   Time: 4098-11911230-1251 OT Time Calculation (min): 21 min Charges:  OT General Charges $OT Visit: 1 Procedure OT Evaluation $Initial OT Evaluation Tier I: 1 Procedure OT Treatments $Therapeutic Activity: 8-22 mins G-Codes:    Galen ManilaSpencer, Rea Kalama Jeanette 05/10/2013, 1:26 PM

## 2013-05-10 NOTE — Evaluation (Signed)
Physical Therapy Evaluation Patient Details Name: [name] MRN: [id number] DOB: Dec 27, 1914 Today's Date: 05/10/2013   History of Present Illness  80 y.o. female admitted to [facility] on 05/09/13 with worsening cough and tachycardia at her PCP's office.  She was dx with HCAP and A-fib with RVR.  Pt with significant PMHx of HTN, falls, A-fib, MVP, rectal CA, anxiety, femur and wrist fx, and B THA.    Clinical Impression  Pt is progressing well with her mobility.  She had no DOE with gait and her gait today is likely close to baseline.  She was active with [facility] services through [place] and HHPT was active.  She would benefit from resuming these services after discharge.   PT to follow acutely for deficits listed below.       Follow Up Recommendations Home health PT;Supervision for mobility/OOB (resume [facility] services that were in place)    Equipment Recommendations  None recommended by PT    Recommendations for Other Services   None    Precautions / Restrictions Precautions Precautions: Fall Precaution Comments: h/o falls and fear of falling.       Mobility  Bed Mobility Overal bed mobility: Modified Independent             General bed mobility comments: Pt uses bed rail to pull to sitting.    Transfers Overall transfer level: Needs assistance Equipment used: Rolling walker (2 wheeled) Transfers: Sit to/from Stand Sit to Stand: Min guard         General transfer comment: I tried to get her to do as much as she could on her own, but even with the elevated bed and the use of her hands during the transition, I still had to provide min guard assist at her trunk for balance during her transitions as she has a slight tendancy to lean posteriorly.   Ambulation/Gait Ambulation/Gait assistance: Min guard Ambulation Distance (Feet): 180 Feet Assistive device: Rolling walker (2 wheeled) Gait Pattern/deviations: Step-through pattern;Shuffle;Trunk flexed Gait velocity:  decreased   General Gait Details: Pt with slow, puroseful/cautious gait.  Min guard assist for safety, again as she tends to stagger when encountering obstacles, especially moving ones in the hallway.         Balance Overall balance assessment: Needs assistance Sitting-balance support: Feet supported Sitting balance-Leahy Scale: Good     Standing balance support: Bilateral upper extremity supported Standing balance-Leahy Scale: Poor                       Pertinent Vitals/Pain  05/10/13 1633  Vital Signs  Pulse Rate ! 106 (at rest before working with PT)  Pain Assessment  Pain Assessment No/denies pain    05/10/13 1700  Vital Signs  Pulse Rate ! 121 (while ambulating with PT)  Oxygen Therapy  SpO2 97 % (no reports of DOE or feeling of palpitations with gait.  )  O2 Device None (Room air)       Home Living Family/patient expects to be discharged to:: Private residence Living Arrangements: Children Available Help at Discharge: Family;Friend(s);Available 24 hours/day Type of Home: House Home Access: Stairs to enter Entrance Stairs-Rails: None Entrance Stairs-Number of Steps: 2 Home Layout: One level Home Equipment: Adaptive equipment;Shower seat;Walker - 4 wheels Additional Comments: Per pt she will have 24/7 assist at home    Prior Function Level of Independence: Needs assistance   Gait / Transfers Assistance Needed: pt reports she ambulates with RW in house   ADL's /  Homemaking Assistance Needed: dtr assists pt with LB bathing and dressing        Hand Dominance   Dominant Hand: Right    Extremity/Trunk Assessment   Upper Extremity Assessment: Defer to OT evaluation           Lower Extremity Assessment: Generalized weakness      Cervical / Trunk Assessment: Other exceptions  Communication   Communication: No difficulties  Cognition Arousal/Alertness: Awake/alert Behavior During Therapy: WFL for tasks assessed/performed Overall  Cognitive Status: Within Functional Limits for tasks assessed (no obvious signs of cognitive deficits.  All info accurate)                      General Comments General comments (skin integrity, edema, etc.): No DOE with gait, HR 121, pt is asymptomatic, O2 sats 97% on RA during gait.           Assessment/Plan    PT Assessment Patient needs continued PT services  PT Diagnosis Difficulty walking;Abnormality of gait;Generalized weakness   PT Problem List Decreased strength;Decreased activity tolerance;Decreased balance;Decreased mobility;Cardiopulmonary status limiting activity;Pain  PT Treatment Interventions DME instruction;Gait training;Stair training;Functional mobility training;Therapeutic activities;Therapeutic exercise;Balance training;Neuromuscular re-education;Patient/family education;Modalities   PT Goals (Current goals can be found in the Care Plan section) Acute Rehab PT Goals Patient Stated Goal: to go home, be as independent as possible.  PT Goal Formulation: With patient/family Time For Goal Achievement: 05/24/13 Potential to Achieve Goals: Good    Frequency Min 3X/week   Barriers to discharge   None      End of Session   Activity Tolerance: Patient tolerated treatment well Patient left: in bed;with call bell/phone within reach;with family/visitor present (seated EOB with daughter in room. )         Time: 1633-1700 PT Time Calculation (min): 27 min   Charges:   PT Evaluation $Initial PT Evaluation Tier I: 1 Procedure PT Treatments $Gait Training: 8-22 mins        Ziyan Hillmer B. Keionte Swicegood, PT, DPT [phone]   05/10/2013, 5:39 PM

## 2013-05-10 NOTE — Progress Notes (Signed)
PT Cancellation Note  Patient Details Name: [name] MRN: [id number] DOB: 11/15/1914   Cancelled Treatment:    Reason Eval/Treat Not Completed: Fatigue/lethargy limiting ability to participate;Patient declined, no reason specified. Pt/daughter present.  Pt reports "I feel too tired and wobbly to do anything right now"  Pt's daughter states that pt usually performs better in the afternoon.  Will attempt PT eval again as appropriate.   [name] 05/10/2013, 10:14 AM

## 2013-05-10 NOTE — Progress Notes (Signed)
Patient ID: Lenice PressmanRuth T Cirrincione  female  ZOX:[id number]    DOB: [birth year]    DOA: 05/09/2013  PCP: Egbert GaribaldiMillsaps, [name], NP  Assessment/Plan: Principal Problem:   HCAP (healthcare-associated pneumonia) - Flu negative - Continue IV vancomycin and cefepime - Follow blood cultures and sputum culture  Active Problems:   HTN (hypertension) - Stable    Hyperlipidemia -    Atrial fibrillation with RVR - Currently controlled, continue Cardizem and Lopressor   DVT Prophylaxis: Lovenox  Code Status: DO NOT RESUSCITATE  Family Communication:  Disposition: Hopefully next 1 or 2 days  Consultants:  None  Procedures:  None  Antibiotics:  Vancomycin and cefepime 3/30 >>    Subjective: Seen and examined, feels somewhat better than admission, no fevers or chills, no nausea vomiting  Objective: Weight change:   Intake/Output Summary (Last 24 hours) at 05/10/13 1450 Last data filed at 05/10/13 0856  Gross per 24 hour  Intake    120 ml  Output      0 ml  Net    120 ml   Blood pressure 139/63, pulse 83, temperature 97.3 F (36.3 C), temperature source Oral, resp. rate 20, height 5\' 5"  (1.651 m), weight 53.751 kg (118 lb 8 oz), SpO2 95.00%.  Physical Exam: General: Alert and awake, oriented x3, not in any acute distress., Frail CVS: S1-S2 clear, no murmur rubs or gallops Chest: clear to auscultation bilaterally, no wheezing, rales or rhonchi Abdomen: soft nontender, nondistended, normal bowel sounds  Extremities: no cyanosis, clubbing or edema noted bilaterally Neuro: Cranial nerves II-XII intact, no focal neurological deficits  Lab Results: Basic Metabolic Panel:  Recent Labs Lab 05/09/13 1328 05/10/13 0533  NA 142 139  K 4.0 3.5*  CL 103 103  CO2 25 24  GLUCOSE 147* 90  BUN 18 13  CREATININE 0.48* 0.42*  CALCIUM 8.7 7.9*   Liver Function Tests: No results found for this basename: AST, ALT, ALKPHOS, BILITOT, PROT, ALBUMIN,  in the last 168 hours No results  found for this basename: LIPASE, AMYLASE,  in the last 168 hours No results found for this basename: AMMONIA,  in the last 168 hours CBC:  Recent Labs Lab 05/09/13 1328 05/10/13 0533  WBC 5.1 5.5  HGB 13.6 12.0  HCT 40.0 35.9*  MCV 96.6 96.5  PLT 153 146*   Cardiac Enzymes: No results found for this basename: CKTOTAL, CKMB, CKMBINDEX, TROPONINI,  in the last 168 hours BNP: No components found with this basename: POCBNP,  CBG: No results found for this basename: GLUCAP,  in the last 168 hours   Micro Results: Recent Results (from the past 240 hour(s))  CULTURE, BLOOD (ROUTINE X 2)     Status: None   Collection Time    05/09/13  4:15 PM      Result Value Ref Range Status   Specimen Description BLOOD ARM LEFT   Final   Special Requests BOTTLES DRAWN AEROBIC AND ANAEROBIC 10CC   Final   Culture  Setup Time     Final   Value: 05/09/2013 20:36     Performed at [facility]   Culture     Final   Value:        BLOOD CULTURE RECEIVED NO GROWTH TO DATE CULTURE WILL BE HELD FOR 5 DAYS BEFORE ISSUING A FINAL NEGATIVE REPORT     Performed at [facility]   Report Status PENDING   Incomplete  CULTURE, BLOOD (ROUTINE X 2)  Status: None   Collection Time    05/09/13  4:19 PM      Result Value Ref Range Status   Specimen Description BLOOD HAND LEFT   Final   Special Requests BOTTLES DRAWN AEROBIC ONLY 10CC   Final   Culture  Setup Time     Final   Value: 05/09/2013 20:37     Performed at [facility]   Culture     Final   Value:        BLOOD CULTURE RECEIVED NO GROWTH TO DATE CULTURE WILL BE HELD FOR 5 DAYS BEFORE ISSUING A FINAL NEGATIVE REPORT     Performed at [facility]   Report Status PENDING   Incomplete  MRSA PCR SCREENING     Status: None   Collection Time    05/09/13  5:09 PM      Result Value Ref Range Status   MRSA by PCR NEGATIVE  NEGATIVE Final   Comment:            The GeneXpert MRSA Assay (FDA     approved for NASAL specimens       only), is one component of a     comprehensive MRSA colonization     surveillance program. It is not     intended to diagnose MRSA     infection nor to guide or     monitor treatment for     MRSA infections.    Studies/Results: Dg Chest 2 View  05/09/2013   CLINICAL DATA:  One-week history of cough and congestion  EXAM: CHEST  2 VIEW  COMPARISON:  PA and lateral chest x-ray Jun 11, 2011  FINDINGS: The lungs are well-expanded. There are confluent alveolar infiltrates in the left lower lobe which are new. There is no significant pleural effusion. The cardiac silhouette is normal in size. The pulmonary vascularity is not engorged. The mediastinum is normal in width. There is small amount of apical pleural thickening bilaterally. There is gentle S-shaped scoliosis. The thoracic spine. There is high-grade wedge compression of the body of approximately T1. Mild compression of T2 is present as well.  IMPRESSION: 1. The findings are consistent with left lower lobe pneumonia. Followup films following therapy are recommended to assure clearing. 2. There are wedge compressions of the bodies of approximately L1 and L2.   Electronically Signed   By: David  [place]   On: 05/09/2013 14:14    Medications: Scheduled Meds: . ceFEPime (MAXIPIME) IV  1 g Intravenous Q24H  . diltiazem  180 mg Oral Daily  . enoxaparin (LOVENOX) injection  40 mg Subcutaneous Q24H  . metoprolol tartrate  25 mg Oral BID  . vancomycin  500 mg Intravenous Q24H      LOS: 1 day   Sho Salguero M.D. [facility] 05/10/2013, 2:50 PM Pager: [phone]  If 7PM-7AM, please contact night-coverage [email or web address] Password [email or web address]

## 2013-05-11 DIAGNOSIS — J189 Pneumonia, unspecified organism: Secondary | ICD-10-CM

## 2013-05-11 HISTORY — DX: Pneumonia, unspecified organism: J18.9

## 2013-05-11 MED ORDER — LEVOFLOXACIN 750 MG PO TABS
750.0000 mg | ORAL_TABLET | ORAL | Status: DC
  Administered 2013-05-12: 750 mg via ORAL
  Filled 2013-05-11: qty 1

## 2013-05-11 NOTE — Progress Notes (Addendum)
Patient ID: [name]  female  ZOX:[id number]    DOB: [birth year]    DOA: 05/09/2013  PCP: Egbert Garibaldi, NP  Brief summary 80 year old female with PMH of hypertension, hyperlipidemia, recurrent falls, atrial fibrillation, recent hospitalization a month ago after a fall, discharge to rehabilitation and then home week ago, admitted on 3/30 with complaints of productive cough, subjective fever and chills, sore throat and exposure to sickly contacts with similar symptoms. In the ED, A. fib with RVR in the 130s/140s and chest x-ray consistent with left lower lobe pneumonia.   Assessment/Plan: Principal Problem:   HCAP (healthcare-associated pneumonia) - Flu negative - Started on IV vancomycin and cefepime-day 3. Clinically doing well without fever, leukocytosis and improving symptomatically. Blood cultures x2 negative to date. Will transition to oral levofloxacin to complete total 8 days treatment. - Will need followup chest x-ray in 4-6 weeks to ensure resolution of pneumonia findings.  Active Problems:   HTN (hypertension) - Stable    Hyperlipidemia    Atrial fibrillation with RVR - Currently controlled, continue Cardizem and Lopressor. Probably not an anticoagulation candidate secondary to fall risk.   DVT Prophylaxis: Lovenox Code Status: DO NOT RESUSCITATE Family Communication: None at bedside  Disposition: Possible DC home 4/2  Consultants:  None  Procedures:  None  Antibiotics:  Vancomycin and cefepime 3/30 >>  Subjective: States that she feels much better. Cough with minimal white sputum. Denies chest pain or dyspnea.  Objective: Weight change: -0.977 kg (-2 lb 2.5 oz)  Intake/Output Summary (Last 24 hours) at 05/11/13 1639 Last data filed at 05/11/13 0549  Gross per 24 hour  Intake      0 ml  Output    400 ml  Net   -400 ml   Blood pressure 134/58, pulse 92, temperature 98.6 F (37 C), temperature source Oral, resp. rate 20, height 5\' 5"   (1.651 m), weight 53.455 kg (117 lb 13.6 oz), SpO2 97.00%.  Physical Exam: General: Alert and awake, oriented x3, not in any acute distress., Frail CVS: S1-S2 irregularly irregular, no murmur rubs or gallops. Telemetry: atrial fibrillation with ventricular rate in the 90s to 100s Chest: Occasional basal crackles but otherwise clear to auscultation bilaterally, no wheezing, rales or rhonchi Abdomen: soft nontender, nondistended, normal bowel sounds  Extremities: no cyanosis, clubbing or edema noted bilaterally Neuro: Cranial nerves II-XII intact, no focal neurological deficits  Lab Results: Basic Metabolic Panel:  Recent Labs Lab 05/09/13 1328 05/10/13 0533  NA 142 139  K 4.0 3.5*  CL 103 103  CO2 25 24  GLUCOSE 147* 90  BUN 18 13  CREATININE 0.48* 0.42*  CALCIUM 8.7 7.9*   Liver Function Tests: No results found for this basename: AST, ALT, ALKPHOS, BILITOT, PROT, ALBUMIN,  in the last 168 hours No results found for this basename: LIPASE, AMYLASE,  in the last 168 hours No results found for this basename: AMMONIA,  in the last 168 hours CBC:  Recent Labs Lab 05/09/13 1328 05/10/13 0533  WBC 5.1 5.5  HGB 13.6 12.0  HCT 40.0 35.9*  MCV 96.6 96.5  PLT 153 146*   Cardiac Enzymes: No results found for this basename: CKTOTAL, CKMB, CKMBINDEX, TROPONINI,  in the last 168 hours BNP: No components found with this basename: POCBNP,  CBG: No results found for this basename: GLUCAP,  in the last 168 hours   Micro Results: Recent Results (from the past 240 hour(s))  CULTURE, BLOOD (ROUTINE X 2)     Status:  None   Collection Time    05/09/13  4:15 PM      Result Value Ref Range Status   Specimen Description BLOOD ARM LEFT   Final   Special Requests BOTTLES DRAWN AEROBIC AND ANAEROBIC 10CC   Final   Culture  Setup Time     Final   Value: 05/09/2013 20:36     Performed at [facility]   Culture     Final   Value:        BLOOD CULTURE RECEIVED NO GROWTH TO DATE  CULTURE WILL BE HELD FOR 5 DAYS BEFORE ISSUING A FINAL NEGATIVE REPORT     Performed at [facility]   Report Status PENDING   Incomplete  CULTURE, BLOOD (ROUTINE X 2)     Status: None   Collection Time    05/09/13  4:19 PM      Result Value Ref Range Status   Specimen Description BLOOD HAND LEFT   Final   Special Requests BOTTLES DRAWN AEROBIC ONLY 10CC   Final   Culture  Setup Time     Final   Value: 05/09/2013 20:37     Performed at [facility]   Culture     Final   Value:        BLOOD CULTURE RECEIVED NO GROWTH TO DATE CULTURE WILL BE HELD FOR 5 DAYS BEFORE ISSUING A FINAL NEGATIVE REPORT     Performed at [facility]   Report Status PENDING   Incomplete  MRSA PCR SCREENING     Status: None   Collection Time    05/09/13  5:09 PM      Result Value Ref Range Status   MRSA by PCR NEGATIVE  NEGATIVE Final   Comment:            The GeneXpert MRSA Assay (FDA     approved for NASAL specimens     only), is one component of a     comprehensive MRSA colonization     surveillance program. It is not     intended to diagnose MRSA     infection nor to guide or     monitor treatment for     MRSA infections.    Studies/Results: Dg Chest 2 View  05/09/2013   CLINICAL DATA:  One-week history of cough and congestion  EXAM: CHEST  2 VIEW  COMPARISON:  PA and lateral chest x-ray Jun 11, 2011  FINDINGS: The lungs are well-expanded. There are confluent alveolar infiltrates in the left lower lobe which are new. There is no significant pleural effusion. The cardiac silhouette is normal in size. The pulmonary vascularity is not engorged. The mediastinum is normal in width. There is small amount of apical pleural thickening bilaterally. There is gentle S-shaped scoliosis. The thoracic spine. There is high-grade wedge compression of the body of approximately T1. Mild compression of T2 is present as well.  IMPRESSION: 1. The findings are consistent with left lower lobe pneumonia.  Followup films following therapy are recommended to assure clearing. 2. There are wedge compressions of the bodies of approximately L1 and L2.   Electronically Signed   By: David  [place]   On: 05/09/2013 14:14    Medications: Scheduled Meds: . ceFEPime (MAXIPIME) IV  1 g Intravenous Q24H  . diltiazem  180 mg Oral Daily  . enoxaparin (LOVENOX) injection  40 mg Subcutaneous Q24H  . metoprolol tartrate  25 mg Oral BID  . vancomycin  500 mg  Intravenous Q24H   Time: 25 minutes   LOS: 2 days   Londen Lorge, MD, FACP, FHM. [facility] Pager [phone]  If 7PM-7AM, please contact night-coverage [email or web address] Password [facility] 05/11/2013, 4:45 PM

## 2013-05-11 NOTE — Progress Notes (Signed)
OT Cancellation Note  Patient Details Name: [name] MRN: [id number] DOB: [birth year]   Cancelled Treatment:    Reason Eval/Treat Not Completed: Patient declined; she believes she is back to baseline with adls.  She did need assistance with PT yesterday but will have 24/7 at home.  Will reattempt another time.  Laszlo Ellerby 05/11/2013, 9:01 AM [name], OTR/L [phone] 05/11/2013

## 2013-05-11 NOTE — Progress Notes (Signed)
ANTIBIOTIC CONSULT NOTE - FOLLOW UP  Pharmacy Consult for Levaquin PO Indication: HCAP  Allergies  Allergen Reactions  . Sulfa Drugs Cross Reactors Hives and Other (See Comments)    Almost passed out  . Irbesartan Other (See Comments)    Caused heart to race    Patient Measurements: Height: 5\' 5"  (165.1 cm) Weight: 117 lb 13.6 oz (53.455 kg) IBW/kg (Calculated) : 57  Vital Signs: Temp: 98.6 F (37 C) (04/01 0548) Temp src: Oral (04/01 1340) BP: 134/58 mmHg (04/01 1340) Pulse Rate: 92 (04/01 1340)  Labs:  Recent Labs  05/09/13 1328 05/10/13 0533  WBC 5.1 5.5  HGB 13.6 12.0  PLT 153 146*  CREATININE 0.48* 0.42*   Estimated Creatinine Clearance: 33.9 ml/min (by C-G formula based on Cr of 0.42).  Assessment:   To transition from Vancomycin and Cefepime to Levaquin PO to complete 8 days of antibiotics. Day # 3 IV antibiotics. Daily doses of Vancomycin and Cefepime were just given this afternoon.  Goal of Therapy:  appropriate Levaquin dose for renal function and infection  Plan:   Levaquin 750 mg PO q48hrs x 3 doses to begin on 4/2 am.  Last dose due 4/6, to complete 8 days of antibiotics.  Dennie Fettersgan, Darnell Stimson Donovan, [place] Pager: [phone] 05/11/2013,5:02 PM

## 2013-05-12 ENCOUNTER — Encounter (HOSPITAL_COMMUNITY): Payer: Self-pay | Admitting: General Practice

## 2013-05-12 LAB — CBC
HCT: 38.2 % (ref 36.0–46.0)
HEMOGLOBIN: 13 g/dL (ref 12.0–15.0)
MCH: 32.3 pg (ref 26.0–34.0)
MCHC: 34 g/dL (ref 30.0–36.0)
MCV: 95 fL (ref 78.0–100.0)
PLATELETS: 150 10*3/uL (ref 150–400)
RBC: 4.02 MIL/uL (ref 3.87–5.11)
RDW: 12.9 % (ref 11.5–15.5)
WBC: 4.3 10*3/uL (ref 4.0–10.5)

## 2013-05-12 MED ORDER — LEVOFLOXACIN 750 MG PO TABS: 750.0000 mg | ORAL_TABLET | ORAL | Status: DC

## 2013-05-12 NOTE — Discharge Summary (Addendum)
Physician Discharge Summary  [name]:[id number] DOB: 01-16-1915 DOA: 05/09/2013  PCP: Egbert Garibaldi, NP  Admit date: 05/09/2013 Discharge date: 05/12/2013  Time spent: Less than 30 minutes  Recommendations for Outpatient Follow-up:  1. Dr. Marva Panda, PCP in 5 days 2. Recommend followup chest x-ray in 4-6 weeks to ensure resolution of pneumonia findings. 3. Please followup outstanding final blood culture results that were sent from the hospital. 4. Resume home health PT  Discharge Diagnoses:  Principal Problem:   HCAP (healthcare-associated pneumonia) Active Problems:   HTN (hypertension)   Hyperlipidemia   Atrial fibrillation with RVR   Discharge Condition: Improved & Stable  Diet recommendation: Heart healthy diet.  Filed Weights   05/09/13 1326 05/10/13 0557 05/11/13 0548  Weight: 54.432 kg (120 lb) 53.751 kg (118 lb 8 oz) 53.455 kg (117 lb 13.6 oz)    History of present illness:  80 year old female with PMH of hypertension, hyperlipidemia, recurrent falls, atrial fibrillation, recent hospitalization a month ago after a fall, discharge to rehabilitation and then home week ago, admitted on 3/30 with complaints of productive cough, subjective fever and chills, sore throat and exposure to sickly contacts with similar symptoms. In the ED, A. fib with RVR in the 130s/140s and chest x-ray consistent with left lower lobe pneumonia.  Hospital Course:   Principal Problem:  HCAP (healthcare-associated pneumonia)-left lower lobe. - Flu negative  - Started on IV vancomycin and cefepime and completed 3 days. Clinically doing well without fever, leukocytosis and improving symptomatically. Blood cultures x2 negative to date. Transitioned to oral levofloxacin on 4/2 to complete total 8 days treatment.  - Will need followup chest x-ray in 4-6 weeks to ensure resolution of pneumonia findings.   Active Problems:  HTN (hypertension)  - Stable   Hyperlipidemia    Atrial fibrillation with RVR  - Currently controlled, continue Cardizem and Lopressor. Probably not an anticoagulation candidate secondary to fall risk.  DO NOT RESUSCITATE  Consultations:  None  Procedures:  None    Discharge Exam:  Complaints:  Patient states that she feels much better. Has some cough, mostly at night but color has cleared from all yellow to occasionally yellow and mostly white. Denies chest pain, dyspnea, palpitations. States that her daughter stays with her almost 24/7 and will assist her.  Filed Vitals:   05/11/13 1007 05/11/13 1340 05/11/13 2120 05/12/13 0432  BP: 132/71 134/58 134/81 146/82  Pulse: 103 92 112 96  Temp:   99.3 F (37.4 C) 98.7 F (37.1 C)  TempSrc:  Oral Oral Oral  Resp:  20 20 19   Height:      Weight:      SpO2:  97% 98% 93%    General: Alert and awake, oriented x3, not in any acute distress., Frail  CVS: S1-S2 irregularly irregular, no murmur rubs or gallops. Telemetry: atrial fibrillation with mostly controlled ventricular rate. Occasional rapid A. fib in the 120s.  Chest: Occasional basal crackles but otherwise clear to auscultation bilaterally, no wheezing, rales or rhonchi  Abdomen: soft nontender, nondistended, normal bowel sounds  Extremities: no cyanosis, clubbing or edema noted bilaterally  Neuro: Cranial nerves II-XII intact, no focal neurological deficits   Discharge Instructions      Discharge Orders   Future Orders Complete By Expires   Call MD for:  difficulty breathing, headache or visual disturbances  As directed    Call MD for:  temperature >100.4  As directed    Diet - low sodium heart healthy  As directed    Increase activity slowly  As directed        Medication List         acetaminophen 500 MG tablet  Commonly known as:  TYLENOL  Take 1,000 mg by mouth daily as needed for moderate pain (pain).     CLEAR EYES OP  Place 1 drop into both eyes at bedtime as needed (dry eyes/ itching).      CRANBERRY PO  Take 1 tablet by mouth daily.     diltiazem 180 MG 24 hr capsule  Commonly known as:  CARDIZEM CD  Take 1 capsule (180 mg total) by mouth daily.     lactose free nutrition Liqd  Take 237 mLs by mouth daily at 2 PM daily at 2 PM.     levofloxacin 750 MG tablet  Commonly known as:  LEVAQUIN  Take 1 tablet (750 mg total) by mouth every other day.  Start taking on:  05/14/2013     loratadine 10 MG tablet  Commonly known as:  CLARITIN  Take 10 mg by mouth daily.     metoprolol tartrate 25 MG tablet  Commonly known as:  LOPRESSOR  Take 25 mg by mouth 2 (two) times daily.     NON FORMULARY  Take 1.5 mLs by mouth daily as needed ("[facility] cold care").     vitamin C 500 MG tablet  Commonly known as:  ASCORBIC ACID  Take 500 mg by mouth daily as needed (for immune support).       Follow-up Information   Follow up with Millsaps, Joelene Millin, NP. Schedule an appointment as soon as possible for a visit in 5 days.   Contact information:   [facility] [address] [place] [address] [phone]        The results of significant diagnostics from this hospitalization (including imaging, microbiology, ancillary and laboratory) are listed below for reference.    Significant Diagnostic Studies: Dg Chest 2 View  05/09/2013   CLINICAL DATA:  One-week history of cough and congestion  EXAM: CHEST  2 VIEW  COMPARISON:  PA and lateral chest x-ray Jun 11, 2011  FINDINGS: The lungs are well-expanded. There are confluent alveolar infiltrates in the left lower lobe which are new. There is no significant pleural effusion. The cardiac silhouette is normal in size. The pulmonary vascularity is not engorged. The mediastinum is normal in width. There is small amount of apical pleural thickening bilaterally. There is gentle S-shaped scoliosis. The thoracic spine. There is high-grade wedge compression of the body of approximately T1. Mild compression of T2 is present as  well.  IMPRESSION: 1. The findings are consistent with left lower lobe pneumonia. Followup films following therapy are recommended to assure clearing. 2. There are wedge compressions of the bodies of approximately L1 and L2.   Electronically Signed   By: David  [place]   On: 05/09/2013 14:14    Microbiology: Recent Results (from the past 240 hour(s))  CULTURE, BLOOD (ROUTINE X 2)     Status: None   Collection Time    05/09/13  4:15 PM      Result Value Ref Range Status   Specimen Description BLOOD ARM LEFT   Final   Special Requests BOTTLES DRAWN AEROBIC AND ANAEROBIC 10CC   Final   Culture  Setup Time     Final   Value: 05/09/2013 20:36     Performed at [facility]  Final   Value:        BLOOD CULTURE RECEIVED NO GROWTH TO DATE CULTURE WILL BE HELD FOR 5 DAYS BEFORE ISSUING A FINAL NEGATIVE REPORT     Performed at [facility]   Report Status PENDING   Incomplete  CULTURE, BLOOD (ROUTINE X 2)     Status: None   Collection Time    05/09/13  4:19 PM      Result Value Ref Range Status   Specimen Description BLOOD HAND LEFT   Final   Special Requests BOTTLES DRAWN AEROBIC ONLY 10CC   Final   Culture  Setup Time     Final   Value: 05/09/2013 20:37     Performed at [facility]   Culture     Final   Value:        BLOOD CULTURE RECEIVED NO GROWTH TO DATE CULTURE WILL BE HELD FOR 5 DAYS BEFORE ISSUING A FINAL NEGATIVE REPORT     Performed at [facility]   Report Status PENDING   Incomplete  MRSA PCR SCREENING     Status: None   Collection Time    05/09/13  5:09 PM      Result Value Ref Range Status   MRSA by PCR NEGATIVE  NEGATIVE Final   Comment:            The GeneXpert MRSA Assay (FDA     approved for NASAL specimens     only), is one component of a     comprehensive MRSA colonization     surveillance program. It is not     intended to diagnose MRSA     infection nor to guide or     monitor treatment for     MRSA infections.      Labs: Basic Metabolic Panel:  Recent Labs Lab 05/09/13 1328 05/10/13 0533  NA 142 139  K 4.0 3.5*  CL 103 103  CO2 25 24  GLUCOSE 147* 90  BUN 18 13  CREATININE 0.48* 0.42*  CALCIUM 8.7 7.9*   Liver Function Tests: No results found for this basename: AST, ALT, ALKPHOS, BILITOT, PROT, ALBUMIN,  in the last 168 hours No results found for this basename: LIPASE, AMYLASE,  in the last 168 hours No results found for this basename: AMMONIA,  in the last 168 hours CBC:  Recent Labs Lab 05/09/13 1328 05/10/13 0533 05/12/13 1020  WBC 5.1 5.5 4.3  HGB 13.6 12.0 13.0  HCT 40.0 35.9* 38.2  MCV 96.6 96.5 95.0  PLT 153 146* 150   Cardiac Enzymes: No results found for this basename: CKTOTAL, CKMB, CKMBINDEX, TROPONINI,  in the last 168 hours BNP: BNP (last 3 results)  Recent Labs  05/09/13 1328  PROBNP 2605.0*   CBG: No results found for this basename: GLUCAP,  in the last 168 hours  Additional labs: 1. Influenza panel PCR: Negative 2. Urine Legionella and streptococcal antigen: Negative  Discussed with daughter Ms. Camelia Engoberta Davies, updated care and answered questions. She will be the one staying with patient and providing 24/7 supervision and assistance.  Signed:  Marcellus ScottHONGALGI,Makinlee Awwad, MD, FACP, FHM. [facility] Pager [phone]  If 7PM-7AM, please contact night-coverage [email or web address] Password [email or web address] 05/12/2013, 12:39 PM

## 2013-05-12 NOTE — Care Management Note (Signed)
    Page 1 of 1   05/12/2013     2:10:21 PM   CARE MANAGEMENT NOTE 05/12/2013  Patient:  [name],[name]   Account Number:  [id number]  Date Initiated:  05/12/2013  Documentation initiated by:  GRAVES-BIGELOW,Takumi Din  Subjective/Objective Assessment:   Pt admitted for cough. Treated for HCAP. Plan for d/c today to home.     Action/Plan:   [facility]. SOC to begin within 24-48 hrs post d/c. No further needs from CM.   Anticipated DC Date:  05/12/2013   Anticipated DC Plan:  HOME W HOME HEALTH SERVICES      DC Planning Services  CM consult      [facility]  [facility] Of Sv[facility]/PTA Provider   Choice offered to / List presented to:  C-4 Adult Children        HH arranged  HH-2 PT      [facility]  [facility]   Status of service:  Completed, signed off Medicare Important Message given?   (If response is "NO", the following Medicare IM given date fields will be blank) Date Medicare IM given:   Date Additional Medicare IM given:    Discharge Disposition:  HOME W HOME HEALTH SERVICES  Per UR Regulation:  Reviewed for med. necessity/level of care/duration of stay  If discussed at Long Length of Stay Meetings, dates discussed:    Comments:

## 2013-05-12 NOTE — Evaluation (Signed)
Occupational Therapy Evaluation Patient Details Name: Amber PressmanRuth T Berry MRN: [id number] DOB: December 08, 1914 Today's Date: 05/12/2013    History of Present Illness 80 y.o. female [facility] with worsening cough and tachycardia at her PCP's office.  She was dx with HCAP and A-fib with RVR.  Pt with significant PMHx of HTN, falls, A-fib, MVP, rectal CA, anxiety, femur and wrist fx, and B THA.     Clinical Impression   Pt is progressing steadily.  Good endurance for bathing, dressing, and grooming at sink.  Ambulated to bathroom with RW with min guard assist.  Should be safe to discharge with daughter's assist.    Follow Up Recommendations  No OT follow up;Supervision/Assistance - 24 hour    Equipment Recommendations  None recommended by OT    Recommendations for Other Services       Precautions / Restrictions Precautions Precautions: Fall Precaution Comments: h/o falls and fear of falling.       Mobility Bed Mobility Overal bed mobility: Modified Independent                Transfers Overall transfer level: Needs assistance Equipment used: Rolling walker (2 wheeled) Transfers: Sit to/from Stand Sit to Stand: Supervision;Min assist         General transfer comment: supervision from bed, min from regular toilet    Balance                                            ADL Overall ADL's : Needs assistance/impaired     Grooming: Wash/dry hands;Brushing hair;Supervision/safety;Standing   Upper Body Bathing: Set up;Sitting   Lower Body Bathing: Moderate assistance;Sit to/from stand   Upper Body Dressing : Set up;Sitting   Lower Body Dressing: Sit to/from stand;Min guard   Toilet Transfer: Ambulation;Minimal assistance;Regular Toilet;Grab bars Toilet Transfer Details (indicate cue type and reason): LOB x 1 without 3 in1 over toilet Toileting- Clothing Manipulation and Hygiene: Sit to/from stand;Minimal assistance Toileting - Clothing  Manipulation Details (indicate cue type and reason): instructed in front to back pericare to prevent UTI     Functional mobility during ADLs: Min guard;Rolling walker       Vision                     Perception     Praxis      Pertinent Vitals/Pain No pain, VSS     Hand Dominance     Extremity/Trunk Assessment             Communication     Cognition Arousal/Alertness: Awake/alert Behavior During Therapy: WFL for tasks assessed/performed Overall Cognitive Status: Within Functional Limits for tasks assessed                     General Comments       Exercises       Shoulder Instructions      Home Living                                          Prior Functioning/Environment               OT Diagnosis:     OT Problem List:     OT Treatment/Interventions:  OT Goals(Current goals can be found in the care plan section) Acute Rehab OT Goals Patient Stated Goal: to go home, be as independent as possible.   OT Frequency: Min 2X/week   Barriers to D/C:            Co-evaluation              End of Session Equipment Utilized During Treatment: Engineer, water Communication:  (bath completed)  Activity Tolerance: Patient tolerated treatment well Patient left: in chair;with call bell/phone within reach;with family/visitor present   Time: 0925-1000 OT Time Calculation (min): 35 min Charges:  OT General Charges $OT Visit: 1 Procedure OT Treatments $Self Care/Home Management : 23-37 mins G-Codes:    Evern Bio 05/12/2013, 10:07 AM [phone]

## 2013-05-15 LAB — CULTURE, BLOOD (ROUTINE X 2)
CULTURE: NO GROWTH
Culture: NO GROWTH

## 2013-05-31 ENCOUNTER — Other Ambulatory Visit: Payer: Self-pay | Admitting: *Deleted

## 2013-05-31 MED ORDER — DILTIAZEM HCL ER COATED BEADS 180 MG PO CP24: 180.0000 mg | ORAL_CAPSULE | Freq: Every day | ORAL | Status: DC

## 2013-07-03 ENCOUNTER — Other Ambulatory Visit: Payer: Self-pay | Admitting: Cardiovascular Disease

## 2013-09-21 ENCOUNTER — Other Ambulatory Visit: Payer: Self-pay | Admitting: Cardiovascular Disease

## 2013-10-19 ENCOUNTER — Encounter: Payer: Self-pay | Admitting: Cardiovascular Disease

## 2013-10-19 ENCOUNTER — Ambulatory Visit (INDEPENDENT_AMBULATORY_CARE_PROVIDER_SITE_OTHER): Payer: Federal, State, Local not specified - PPO | Admitting: Cardiovascular Disease

## 2013-10-19 VITALS — BP 144/90 | HR 76 | Ht 65.0 in | Wt 121.6 lb

## 2013-10-19 DIAGNOSIS — I4891 Unspecified atrial fibrillation: Secondary | ICD-10-CM

## 2013-10-19 NOTE — Assessment & Plan Note (Signed)
She is back in A-fib clinically.  Her rate is well controlled.  She has a hx of falling and so she is not on anticoagulation.  I think we should continue with her same meds.   I'll see her in 6 months.

## 2013-10-19 NOTE — Progress Notes (Signed)
[name] Date of Birth  November 21, 1914       [facility]    [place] [address]  [address], [address] [place], [place]  [address]   [place], [place]  [address] [phone]     [phone]   Fax  [phone]    Fax [phone]  Problem List: 1. Orthostatic Hypotension  - BP meds adjusted: HCTZ dc'd, Lopressor decreased during hospitalization April , 2013 2. Dysuria  - Urinalysis w/ (+) leukocytes, nitrite negative; urine culture pending  - Macrobid initiated on 5/1  Secondary Discharge Diagnoses:  1. Atrial Fibrillation  2. Hypertension  3. Mitral Valve Prolapse  4. H/o Rectal Cancer  5. Osteoporosis  6. Right Femoral neck fracture 2010  7. Left Wrist fracture   History of Present Illness:  She's done very well since she left the hospital. She was admitted in April with episodes of orthostatic hypotension. We made some medication adjustments including stopping her HCTZ and we decreased her Toprol dose.  We have had to cut her blood pressure medicines such that she has l high readings especially in the morning. This is because her drops her blood pressure dropped so quickly when she stands up.    She continues to be unsteady when she walks. She occasionally will walk with a cane or walker. We have encouraged her to use her cane and walker every time she goes out to walk.  May 25, 2012:  Ms. Lipuma is doing well from a cardiac standpoint.  She has had lots of UTIs but these are better.    She has significant orthostasis.  Her BP is usually pretty good in the mornings - 140-150.    April 18, 2013:  Shalisa was admitted to the hospital recently because of syncope.  Sept. 9, 2015:  Tyquasia is doing well. No further episodes of syncope.   Fairly steady with her walker.  No CP, breathing is ok.  She has had a-fib.  Takes Cartia which helped.  Current Outpatient Prescriptions on File Prior to Visit  Medication Sig Dispense Refill  .  acetaminophen (TYLENOL) 500 MG tablet Take 1,000 mg by mouth daily as needed for moderate pain (pain).       . CRANBERRY PO Take 1 tablet by mouth daily.      Marland Kitchen diltiazem (CARDIZEM CD) 180 MG 24 hr capsule Take 1 capsule (180 mg total) by mouth daily.  30 capsule  5  . lactose free nutrition (BOOST PLUS) LIQD Take 237 mLs by mouth daily at 2 PM daily at 2 PM.    0  . metoprolol tartrate (LOPRESSOR) 25 MG tablet Take 25 mg by mouth 2 (two) times daily.      . Naphazoline HCl (CLEAR EYES OP) Place 1 drop into both eyes at bedtime as needed (dry eyes/ itching).      . vitamin C (ASCORBIC ACID) 500 MG tablet Take 500 mg by mouth daily as needed (for immune support).       No current facility-administered medications on file prior to visit.    Allergies  Allergen Reactions  . Sulfa Drugs Cross Reactors Hives and Other (See Comments)    Almost passed out  . Irbesartan Other (See Comments)    Caused heart to race    Past Medical History  Diagnosis Date  . Hypertension   . MVP (mitral valve prolapse)     Not mentioned on 08/2005 echo however  . OP (  osteoporosis)   . History of rectal cancer   . Fracture of femoral neck, right 2010  . Wrist fracture     left  . Dizziness   . Blood transfusion     "my own blood"  . Headache(784.0)     "terrible while going thru menopause; none since then"  . Arthritis   . Anxiety     "now & then; I get over it"; denies RX  . Syncope 06/10/11    "fell; I've been having these spells; off & on; last time was maybe 2 yr ago"  . Atrial fibrillation     08/2005, normal EF at that time  . Tachyarrhythmia 06/11/11    burst of ST  . Pneumonia 05/2013    "once"    Past Surgical History  Procedure Laterality Date  . Transthoracic echocardiogram  08/20/2005    EF 60%  . Colonoscopy    . Total hip arthroplasty      bilaterally  . Inguinal hernia repair      right  . Breast cyst excision      right  . Cataract extraction w/ intraocular lens  implant,  bilateral    . Colectomy      History  Smoking status  . Never Smoker   Smokeless tobacco  . Never Used    History  Alcohol Use No    Family History  Problem Relation Age of Onset  . Heart disease Neg Hx     Reviw of Systems:  Reviewed in the HPI.  All other systems are negative.  Physical Exam: Blood pressure 144/90, pulse 76, height  (1.651 m), weight 121 lb 9.6 oz (55.157 kg). General: Well developed, well nourished, in no acute distress.  Looks quite good for her age  Head: Normocephalic, atraumatic,  mucus membranes are moist,   Neck: Supple. Carotids are 2 + without bruits. No JVD  Lungs: Clear bilaterally to auscultation.  Heart: irregularly irregular.  normal  S1 S2. No murmurs, gallops or rubs.  Abdomen: Soft, non-tender, non-distended with normal bowel sounds. No hepatomegaly. No rebound/guarding. No masses.  Msk:  Strength and tone are normal  Extremities: No clubbing or cyanosis. No edema.  Distal pedal pulses are 2+ and equal bilaterally.  Neuro: Alert and oriented X 3. Moves all extremities spontaneously.  Psych:  Responds to questions appropriately with a normal affect.  ECG:  Assessment / Plan:

## 2013-10-19 NOTE — Patient Instructions (Signed)
Your physician recommends that you continue on your current medications as directed. Please refer to the Current Medication list given to you today.  Your physician wants you to follow-up in: 6 months with Dr. Nahser.  You will receive a reminder letter in the mail two months in advance. If you don't receive a letter, please call our office to schedule the follow-up appointment.  

## 2013-11-15 ENCOUNTER — Other Ambulatory Visit: Payer: Self-pay | Admitting: Cardiovascular Disease

## 2013-11-16 ENCOUNTER — Observation Stay (HOSPITAL_COMMUNITY): Payer: Federal, State, Local not specified - PPO

## 2013-11-16 ENCOUNTER — Encounter (HOSPITAL_COMMUNITY): Payer: Self-pay | Admitting: Emergency Medicine

## 2013-11-16 ENCOUNTER — Observation Stay (HOSPITAL_COMMUNITY)
Admission: EM | Admit: 2013-11-16 | Discharge: 2013-11-18 | Disposition: A | Discharge status: Home / Self Care | Payer: Federal, State, Local not specified - PPO | Attending: Internal Medicine | Admitting: Internal Medicine

## 2013-11-16 ENCOUNTER — Emergency Department (HOSPITAL_COMMUNITY): Payer: Federal, State, Local not specified - PPO

## 2013-11-16 ENCOUNTER — Telehealth: Payer: Self-pay | Admitting: Physician Assistant

## 2013-11-16 DIAGNOSIS — I4891 Unspecified atrial fibrillation: Secondary | ICD-10-CM | POA: Diagnosis not present

## 2013-11-16 DIAGNOSIS — Z66 Do not resuscitate: Secondary | ICD-10-CM | POA: Diagnosis not present

## 2013-11-16 DIAGNOSIS — Z85048 Personal history of other malignant neoplasm of rectum, rectosigmoid junction, and anus: Secondary | ICD-10-CM | POA: Insufficient documentation

## 2013-11-16 DIAGNOSIS — G459 Transient cerebral ischemic attack, unspecified: Secondary | ICD-10-CM | POA: Diagnosis present

## 2013-11-16 DIAGNOSIS — M810 Age-related osteoporosis without current pathological fracture: Secondary | ICD-10-CM | POA: Insufficient documentation

## 2013-11-16 DIAGNOSIS — R531 Weakness: Secondary | ICD-10-CM | POA: Insufficient documentation

## 2013-11-16 DIAGNOSIS — F419 Anxiety disorder, unspecified: Secondary | ICD-10-CM | POA: Insufficient documentation

## 2013-11-16 DIAGNOSIS — R110 Nausea: Secondary | ICD-10-CM | POA: Diagnosis not present

## 2013-11-16 DIAGNOSIS — R079 Chest pain, unspecified: Secondary | ICD-10-CM | POA: Diagnosis present

## 2013-11-16 DIAGNOSIS — R51 Headache: Secondary | ICD-10-CM | POA: Diagnosis not present

## 2013-11-16 DIAGNOSIS — R42 Dizziness and giddiness: Secondary | ICD-10-CM

## 2013-11-16 DIAGNOSIS — R200 Anesthesia of skin: Secondary | ICD-10-CM | POA: Diagnosis not present

## 2013-11-16 DIAGNOSIS — I341 Nonrheumatic mitral (valve) prolapse: Secondary | ICD-10-CM | POA: Diagnosis not present

## 2013-11-16 DIAGNOSIS — M7989 Other specified soft tissue disorders: Secondary | ICD-10-CM | POA: Diagnosis present

## 2013-11-16 DIAGNOSIS — M79662 Pain in left lower leg: Secondary | ICD-10-CM | POA: Insufficient documentation

## 2013-11-16 DIAGNOSIS — Z8781 Personal history of (healed) traumatic fracture: Secondary | ICD-10-CM | POA: Diagnosis not present

## 2013-11-16 DIAGNOSIS — I482 Chronic atrial fibrillation, unspecified: Secondary | ICD-10-CM

## 2013-11-16 DIAGNOSIS — I10 Essential (primary) hypertension: Secondary | ICD-10-CM | POA: Insufficient documentation

## 2013-11-16 DIAGNOSIS — R55 Syncope and collapse: Secondary | ICD-10-CM | POA: Insufficient documentation

## 2013-11-16 DIAGNOSIS — E785 Hyperlipidemia, unspecified: Secondary | ICD-10-CM | POA: Diagnosis present

## 2013-11-16 DIAGNOSIS — M1990 Unspecified osteoarthritis, unspecified site: Secondary | ICD-10-CM | POA: Insufficient documentation

## 2013-11-16 DIAGNOSIS — Z79899 Other long term (current) drug therapy: Secondary | ICD-10-CM | POA: Insufficient documentation

## 2013-11-16 LAB — HEMOGLOBIN A1C
HEMOGLOBIN A1C: 6.3 % — AB (ref <5.7)
Mean Plasma Glucose: 134 mg/dL — ABNORMAL HIGH (ref <117)

## 2013-11-16 LAB — CBC WITH DIFFERENTIAL/PLATELET
Basophils Absolute: 0 10*3/uL (ref 0.0–0.1)
Basophils Relative: 0 % (ref 0–1)
EOS ABS: 0.1 10*3/uL (ref 0.0–0.7)
Eosinophils Relative: 1 % (ref 0–5)
HEMATOCRIT: 40.7 % (ref 36.0–46.0)
Hemoglobin: 13.7 g/dL (ref 12.0–15.0)
LYMPHS ABS: 1.5 10*3/uL (ref 0.7–4.0)
Lymphocytes Relative: 18 % (ref 12–46)
MCH: 32.4 pg (ref 26.0–34.0)
MCHC: 33.7 g/dL (ref 30.0–36.0)
MCV: 96.2 fL (ref 78.0–100.0)
MONO ABS: 0.4 10*3/uL (ref 0.1–1.0)
Monocytes Relative: 5 % (ref 3–12)
Neutro Abs: 6.3 10*3/uL (ref 1.7–7.7)
Neutrophils Relative %: 76 % (ref 43–77)
PLATELETS: 193 10*3/uL (ref 150–400)
RBC: 4.23 MIL/uL (ref 3.87–5.11)
RDW: 13.1 % (ref 11.5–15.5)
WBC: 8.3 10*3/uL (ref 4.0–10.5)

## 2013-11-16 LAB — URINALYSIS, ROUTINE W REFLEX MICROSCOPIC
BILIRUBIN URINE: NEGATIVE
Glucose, UA: NEGATIVE mg/dL
KETONES UR: NEGATIVE mg/dL
Leukocytes, UA: NEGATIVE
NITRITE: NEGATIVE
Protein, ur: NEGATIVE mg/dL
Specific Gravity, Urine: 1.009 (ref 1.005–1.030)
Urobilinogen, UA: 0.2 mg/dL (ref 0.0–1.0)
pH: 8 (ref 5.0–8.0)

## 2013-11-16 LAB — TROPONIN I

## 2013-11-16 LAB — BASIC METABOLIC PANEL
Anion gap: 14 (ref 5–15)
BUN: 16 mg/dL (ref 6–23)
CALCIUM: 8.7 mg/dL (ref 8.4–10.5)
CO2: 26 meq/L (ref 19–32)
Chloride: 106 mEq/L (ref 96–112)
Creatinine, Ser: 0.52 mg/dL (ref 0.50–1.10)
GFR calc Af Amer: 90 mL/min — ABNORMAL LOW (ref >90)
GFR calc non Af Amer: 78 mL/min — ABNORMAL LOW (ref >90)
GLUCOSE: 110 mg/dL — AB (ref 70–99)
Potassium: 3.5 mEq/L — ABNORMAL LOW (ref 3.7–5.3)
Sodium: 146 mEq/L (ref 137–147)

## 2013-11-16 LAB — RAPID URINE DRUG SCREEN, HOSP PERFORMED
Amphetamines: NOT DETECTED
Barbiturates: NOT DETECTED
Benzodiazepines: NOT DETECTED
COCAINE: NOT DETECTED
Opiates: NOT DETECTED
Tetrahydrocannabinol: NOT DETECTED

## 2013-11-16 LAB — URINE MICROSCOPIC-ADD ON

## 2013-11-16 MED ORDER — METOPROLOL TARTRATE 25 MG PO TABS
25.0000 mg | ORAL_TABLET | Freq: Two times a day (BID) | ORAL | Status: DC
  Administered 2013-11-16 – 2013-11-17 (×2): 25 mg via ORAL
  Filled 2013-11-16 (×4): qty 1

## 2013-11-16 MED ORDER — ACETAMINOPHEN 325 MG PO TABS: 650.0000 mg | ORAL_TABLET | ORAL | Status: DC | PRN

## 2013-11-16 MED ORDER — ENOXAPARIN SODIUM 40 MG/0.4ML ~~LOC~~ SOLN
40.0000 mg | SUBCUTANEOUS | Status: DC
  Administered 2013-11-16 – 2013-11-17 (×2): 40 mg via SUBCUTANEOUS
  Filled 2013-11-16 (×3): qty 0.4

## 2013-11-16 MED ORDER — BOOST PLUS PO LIQD
237.0000 mL | Freq: Every day | ORAL | Status: DC
  Administered 2013-11-16 – 2013-11-17 (×2): 237 mL via ORAL
  Filled 2013-11-16 (×4): qty 237

## 2013-11-16 MED ORDER — STROKE: EARLY STAGES OF RECOVERY BOOK
Freq: Once | Status: AC
  Administered 2013-11-16: 14:00:00
  Filled 2013-11-16: qty 1

## 2013-11-16 MED ORDER — DILTIAZEM HCL ER COATED BEADS 180 MG PO CP24
180.0000 mg | ORAL_CAPSULE | Freq: Every day | ORAL | Status: DC
Start: 2013-11-17 — End: 2013-11-18
  Administered 2013-11-17 – 2013-11-18 (×2): 180 mg via ORAL
  Filled 2013-11-16 (×4): qty 1

## 2013-11-16 MED ORDER — INFLUENZA VAC SPLIT QUAD 0.5 ML IM SUSY
0.5000 mL | PREFILLED_SYRINGE | INTRAMUSCULAR | Status: AC
  Administered 2013-11-17: 0.5 mL via INTRAMUSCULAR
  Filled 2013-11-16: qty 0.5

## 2013-11-16 MED ORDER — ASPIRIN 325 MG PO TABS
325.0000 mg | ORAL_TABLET | Freq: Every day | ORAL | Status: DC
  Administered 2013-11-16 – 2013-11-18 (×3): 325 mg via ORAL
  Filled 2013-11-16 (×3): qty 1

## 2013-11-16 NOTE — ED Notes (Signed)
Pt has been having intermittent chest pain, dizziness, not eating, leg pain and hip pain x 2 days. Denies chest pain or SOB currently.

## 2013-11-16 NOTE — Telephone Encounter (Signed)
The patient is a 80 yo female with PMH of orthostatic hypotension and atrial fibrillation status post cardioversion. She has been taking diltiazem and metoprolol at home. Her metoprolol dose was decreased during her previous hospitalization in 2013. She was also taking hydrochlorothiazide which was discontinued during the previous hospitalization as well. She has been followed up by Dr. Elease HashimotoNahser in the clinic. Her last followup was on 10/19/2013 at which time she was doing well.  [facility] as her mother has been having significant weakness, dizziness and nausea since last night. The daughter states the patient has been taking her medication at home, her blood pressure this morning was 175/98. She also has been having some left knee and left leg pain and can't walk very much. At this time given the patient's advanced age of 80 years old, and her comorbidities, I have recommended the patient to seek medical attention at the nearest ED. It is unclear as this time whether her atrial fibrillation has returned or if her symptom is due to orthostatic hypotension. I'm not clear why she is having nausea with mildly elevated blood pressure, this could certainly be the result of a-fib.  Ramond DialSigned, Shakala Marlatt PA Pager: [phone]

## 2013-11-16 NOTE — Consult Note (Signed)
Referring Physician: Hongalgi    Chief Complaint: Stroke  HPI:                                                                                                                                         [name] is an 80 y.o. female who has known Afib but not on AC due to fall risk.  She is not on ASA and daughter knows of no contraindications. Patent awoke this AM and noted her left foot felt numb and she could not move it.  She was able to ambulate (with her walker) to the bathroom where she noted she also had left knee pain.   After using the bathroom she attempted to se her left hand to steady herself on the walker and noted her arm was clumsy. Daughter states these symptoms lasted for about a hour.  Currently she is still having decreased sensation in the left foot.   Date last known well: Date: 11/15/2013 Time last known well: Time: 10:00 tPA Given: No: out of window  Past Medical History  Diagnosis Date  . Hypertension   . MVP (mitral valve prolapse)     Not mentioned on 08/2005 echo however  . OP (osteoporosis)   . History of rectal cancer   . Fracture of femoral neck, right 2010  . Wrist fracture     left  . Dizziness   . Blood transfusion     "my own blood"  . Headache(784.0)     "terrible while going thru menopause; none since then"  . Arthritis   . Anxiety     "now & then; I get over it"; denies RX  . Syncope 06/10/11    "fell; I've been having these spells; off & on; last time was maybe 2 yr ago"  . Atrial fibrillation     08/2005, normal EF at that time  . Tachyarrhythmia 06/11/11    burst of ST  . Pneumonia 05/2013    "once"    Past Surgical History  Procedure Laterality Date  . Transthoracic echocardiogram  08/20/2005    EF 60%  . Colonoscopy    . Total hip arthroplasty      bilaterally  . Inguinal hernia repair      right  . Breast cyst excision      right  . Cataract extraction w/ intraocular lens  implant, bilateral    . Colectomy      Family  History  Problem Relation Age of Onset  . Heart disease Neg Hx    Social History:  reports that she has never smoked. She has never used smokeless tobacco. She reports that she does not drink alcohol or use illicit drugs.  Allergies:  Allergies  Allergen Reactions  . Sulfa Drugs Cross Reactors Hives and Other (See Comments)    Almost passed out  . Irbesartan Other (See Comments)  Caused heart to race    Medications:                                                                                                                           Prior to Admission:  Prescriptions prior to admission  Medication Sig Dispense Refill  . acetaminophen (TYLENOL) 500 MG tablet Take 500-1,000 mg by mouth daily as needed for moderate pain (pain).       . Cranberry (SM CRANBERRY) 300 MG tablet Take 300 mg by mouth daily.      Marland Kitchen lactose free nutrition (BOOST PLUS) LIQD Take 237 mLs by mouth daily at 2 PM daily at 2 PM.    0  . metoprolol tartrate (LOPRESSOR) 25 MG tablet Take 25 mg by mouth 2 (two) times daily.      . Multiple Vitamins-Minerals (MULTIVITAMIN GUMMIES WOMENS) CHEW Chew 1 each by mouth daily.       . Naphazoline HCl (CLEAR EYES OP) Place 1 drop into both eyes at bedtime as needed (dry eyes/ itching).      . vitamin C (ASCORBIC ACID) 500 MG tablet Take 500 mg by mouth daily as needed (for immune support).       Scheduled: .  stroke: mapping our early stages of recovery book   Does not apply Once  . aspirin  325 mg Oral Daily  . [START ON 11/17/2013] diltiazem  180 mg Oral Daily  . enoxaparin (LOVENOX) injection  40 mg Subcutaneous Q24H  . lactose free nutrition  237 mL Oral Q1400  . metoprolol tartrate  25 mg Oral BID    ROS:                                                                                                                                       History obtained from the patient  General ROS: negative for - chills, fatigue, fever, night sweats, weight gain or weight  loss Psychological ROS: negative for - behavioral disorder, hallucinations, memory difficulties, mood swings or suicidal ideation Ophthalmic ROS: negative for - blurry vision, double vision, eye pain or loss of vision ENT ROS: negative for - epistaxis, nasal discharge, oral lesions, sore throat, tinnitus or vertigo Allergy and Immunology ROS: negative for - hives or itchy/watery eyes Hematological and Lymphatic ROS: negative for - bleeding problems, bruising or swollen lymph nodes Endocrine ROS: negative for -  galactorrhea, hair pattern changes, polydipsia/polyuria or temperature intolerance Respiratory ROS: negative for - cough, hemoptysis, shortness of breath or wheezing Cardiovascular ROS: negative for - chest pain, dyspnea on exertion, edema or irregular heartbeat Gastrointestinal ROS: negative for - abdominal pain, diarrhea, hematemesis, nausea/vomiting or stool incontinence Genito-Urinary ROS: negative for - dysuria, hematuria, incontinence or urinary frequency/urgency Musculoskeletal ROS: negative for - joint swelling or muscular weakness Neurological ROS: as noted in HPI Dermatological ROS: negative for rash and skin lesion changes  Neurologic Examination:                                                                                                      Blood pressure 191/78, pulse 96, temperature 97.7 F (36.5 C), temperature source Oral, resp. rate 19, height 5\' 3"  (1.6 m), weight 55.611 kg (122 lb 9.6 oz), SpO2 99.00%.   General: NAD Mental Status: Alert, oriented, thought content appropriate.  Speech fluent without evidence of aphasia.  Able to follow 3 step commands without difficulty. Cranial Nerves: II: Discs flat bilaterally; Visual fields grossly normal, pupils equal, round, reactive to light and accommodation III,IV, VI: ptosis not present, extra-ocular motions intact bilaterally V,VII: smile symmetric, facial light touch sensation normal bilaterally VIII: hearing  normal bilaterally IX,X: gag reflex present XI: bilateral shoulder shrug XII: midline tongue extension without atrophy or fasciculations  Motor: Right : Upper extremity   5/5    Left:     Upper extremity   5/5  Lower extremity   5/5     Lower extremity   5/5 Tone and bulk:normal tone throughout; no atrophy noted Sensory: Pinprick and light touch decreased in the left foot Deep Tendon Reflexes:  Right: Upper Extremity   Left: Upper extremity   biceps (C-5 to C-6) 2/4   biceps (C-5 to C-6) 2/4 tricep (C7) 2/4    triceps (C7) 2/4 Brachioradialis (C6) 2/4  Brachioradialis (C6) 2/4  Lower Extremity Lower Extremity  quadriceps (L-2 to L-4) 2/4   quadriceps (L-2 to L-4) 2/4 Achilles (S1) 1/4   Achilles (S1) 1/4  Plantars: Right: downgoing   Left: downgoing Cerebellar: normal finger-to-nose,  normal heel-to-shin test Gait: not tested due to fall risk.  CV: pulses palpable throughout    Lab Results: Basic Metabolic Panel:  Recent Labs Lab 11/16/13 0930  NA 146  K 3.5*  CL 106  CO2 26  GLUCOSE 110*  BUN 16  CREATININE 0.52  CALCIUM 8.7    Liver Function Tests: No results found for this basename: AST, ALT, ALKPHOS, BILITOT, PROT, ALBUMIN,  in the last 168 hours No results found for this basename: LIPASE, AMYLASE,  in the last 168 hours No results found for this basename: AMMONIA,  in the last 168 hours  CBC:  Recent Labs Lab 11/16/13 0930  WBC 8.3  NEUTROABS 6.3  HGB 13.7  HCT 40.7  MCV 96.2  PLT 193    Cardiac Enzymes:  Recent Labs Lab 11/16/13 0930  TROPONINI <0.30    Lipid Panel: No results found for this basename: CHOL, TRIG, HDL, CHOLHDL, VLDL, LDLCALC,  in the  last 168 hours  CBG: No results found for this basename: GLUCAP,  in the last 168 hours  Microbiology: Results for orders placed during the hospital encounter of 05/09/13  CULTURE, BLOOD (ROUTINE X 2)     Status: None   Collection Time    05/09/13  4:15 PM      Result Value Ref Range  Status   Specimen Description BLOOD ARM LEFT   Final   Special Requests BOTTLES DRAWN AEROBIC AND ANAEROBIC 10CC   Final   Culture  Setup Time     Final   Value: 05/09/2013 20:36     Performed at [facility]   Culture     Final   Value: NO GROWTH 5 DAYS     Performed at [facility]   Report Status 05/15/2013 FINAL   Final  CULTURE, BLOOD (ROUTINE X 2)     Status: None   Collection Time    05/09/13  4:19 PM      Result Value Ref Range Status   Specimen Description BLOOD HAND LEFT   Final   Special Requests BOTTLES DRAWN AEROBIC ONLY 10CC   Final   Culture  Setup Time     Final   Value: 05/09/2013 20:37     Performed at [facility]   Culture     Final   Value: NO GROWTH 5 DAYS     Performed at [facility]   Report Status 05/15/2013 FINAL   Final  MRSA PCR SCREENING     Status: None   Collection Time    05/09/13  5:09 PM      Result Value Ref Range Status   MRSA by PCR NEGATIVE  NEGATIVE Final   Comment:            The GeneXpert MRSA Assay (FDA     approved for NASAL specimens     only), is one component of a     comprehensive MRSA colonization     surveillance program. It is not     intended to diagnose MRSA     infection nor to guide or     monitor treatment for     MRSA infections.    Coagulation Studies: No results found for this basename: LABPROT, INR,  in the last 72 hours  Imaging: Dg Chest 2 View  11/16/2013   CLINICAL DATA:  Chest pain and weakness. Left hip pain. Groin pain.  EXAM: CHEST  2 VIEW  COMPARISON:  05/09/2013  FINDINGS: The left basilar airspace opacity has significantly improved. However, there is bilateral interstitial accentuation along with underlying enlargement of the cardiopericardial silhouette. No definite airspace opacity. Tortuous thoracic aorta. Upper lumbar compression fractures appear similar in magnitude to the prior exam. Stable rim calcified splenic lesion.  IMPRESSION: 1. Mild cardiomegaly with  interstitial accentuation favoring interstitial edema. 2. The left lower lobe airspace opacity has intervally cleared. 3. Upper lumbar compression fractures.   Electronically Signed   By: Herbie Baltimore M.D.   On: 11/16/2013 10:52   Dg Hip Complete Left  11/16/2013   CLINICAL DATA:  Chest pain and weakness.  Left hip and groin pain.  EXAM: LEFT HIP - COMPLETE 2+ VIEW  COMPARISON:  04/05/2013.  FINDINGS: Frontal pelvis shows diffuse bony demineralization. The patient is status post bilateral hip replacement. No evidence for an acute fracture in the pelvis. AP and frog-leg lateral views of the hip show no evidence for periprosthetic fracture.  IMPRESSION: Osteopenia  without acute bony findings.   Electronically Signed   By: Kennith CenterEric  Mansell M.D.   On: 11/16/2013 10:52   Ct Head Wo Contrast  11/16/2013   CLINICAL DATA:  Intermittent chest pain and dizziness.  Not eating.  EXAM: CT HEAD WITHOUT CONTRAST  TECHNIQUE: Contiguous axial images were obtained from the base of the skull through the vertex without intravenous contrast.  COMPARISON:  04/05/2013.  FINDINGS: There is patchy low attenuation throughout the subcortical and periventricular white matter compatible with chronic microvascular disease. Prominence of the sulci and ventricles identified consistent with brain atrophy. No evidence for acute brain infarct. No intracranial hemorrhage or mass. Similar appearance of left frontal lobe calcification, image 27/series 2. Opacification of right maxillary sinus with hyperdense material centrally is unchanged. The skull appears intact.  IMPRESSION: 1. No acute intracranial abnormalities. 2. Similar appearance of calcification within left frontal lobe. 3. Small vessel ischemic change and brain atrophy.   Electronically Signed   By: Signa Kellaylor  Stroud M.D.   On: 11/16/2013 11:44     Felicie MornDavid Smith PA-C [facility] [phone]  11/16/2013, 1:58 PM  Patient seen and examined.  Clinical course and  management discussed.  Necessary edits performed.  I agree with the above.  Assessment and plan of care developed and discussed below.     Assessment: 80 y.o. female presenting with left sided complaints that have nearly resolved.  She has a history of atrial fibrillation on no anticoagulation (due to fall risk) or antiplatelet therapy.  Head CT reviewed and shows no acute changes.  With patient being limited as to treatment options, stroke work up can be modified.  Stroke Risk Factors - atrial fibrillation and hypertension  Recommendations: 1.  PT consult 2.  ASA 325 mg daily 3.  MRI of the brain without contrast 4.  HgbA1c, FLP 5.  Telemetry    Thana FarrLeslie Jazzmine Kleiman, MD [facility] [phone]  11/16/2013  2:52 PM

## 2013-11-16 NOTE — Telephone Encounter (Signed)
Agree with evaluation in the eR May need to be admitted by medicine

## 2013-11-16 NOTE — ED Provider Notes (Signed)
CSN: [id number]     Arrival date & time 11/16/13  4098 History   First MD Initiated Contact with Patient 11/16/13 [phone]     Chief Complaint  Patient presents with  . Chest Pain  . Leg Pain     (Consider location/radiation/quality/duration/timing/severity/associated sxs/prior Treatment) HPI Comments: 80 year old female very healthy and functional for her age, lives at home, history of high blood pressure, atrial fibrillation, lipids, near-syncope, pneumonia presents with multiple different symptoms. I clarified the nurse's notes and with family and patient and primary concern is left sided numbness and weakness with dizziness. Patient denies chest pain or shortness of breath on my examination. Patient has had worsening leg swelling worse in the right lower extremity, denies blood clot history, active cancer, injury. Patient does have left hip and knee pain similar to previous worse with range of motion no new falls. Patient does have it or fibrillation and vascular risk factors.  Patient is a 80 y.o. female presenting with chest pain and leg pain. The history is provided by the patient and a relative.  Chest Pain Associated symptoms: dizziness, headache, nausea, numbness (left arm and leg) and weakness   Associated symptoms: no abdominal pain, no cough, no fever, no shortness of breath and not vomiting   Leg Pain Associated symptoms: no fever     Past Medical History  Diagnosis Date  . Hypertension   . MVP (mitral valve prolapse)     Not mentioned on 08/2005 echo however  . OP (osteoporosis)   . History of rectal cancer   . Fracture of femoral neck, right 2010  . Wrist fracture     left  . Dizziness   . Blood transfusion     "my own blood"  . Headache(784.0)     "terrible while going thru menopause; none since then"  . Arthritis   . Anxiety     "now & then; I get over it"; denies RX  . Syncope 06/10/11    "fell; I've been having these spells; off & on; last time was maybe 2 yr  ago"  . Atrial fibrillation     08/2005, normal EF at that time  . Tachyarrhythmia 06/11/11    burst of ST  . Pneumonia 05/2013    "once"   Past Surgical History  Procedure Laterality Date  . Transthoracic echocardiogram  08/20/2005    EF 60%  . Colonoscopy    . Total hip arthroplasty      bilaterally  . Inguinal hernia repair      right  . Breast cyst excision      right  . Cataract extraction w/ intraocular lens  implant, bilateral    . Colectomy     Family History  Problem Relation Age of Onset  . Heart disease Neg Hx    History  Substance Use Topics  . Smoking status: Never Smoker   . Smokeless tobacco: Never Used  . Alcohol Use: No   OB History   Grav Para Term Preterm Abortions TAB SAB Ect Mult Living                 Review of Systems  Constitutional: Positive for appetite change. Negative for fever and chills.  HENT: Negative for congestion.   Eyes: Negative for visual disturbance.  Respiratory: Negative for cough and shortness of breath.   Cardiovascular: Positive for leg swelling. Negative for chest pain.  Gastrointestinal: Positive for nausea. Negative for vomiting and abdominal pain.  Musculoskeletal: Positive for arthralgias (  chronic).  Skin: Negative for rash.  Neurological: Positive for dizziness, weakness, light-headedness, numbness (left arm and leg) and headaches. Negative for speech difficulty.      Allergies  Sulfa drugs cross reactors and Irbesartan  Home Medications   Prior to Admission medications   Medication Sig Start Date End Date Taking? Authorizing Provider  acetaminophen (TYLENOL) 500 MG tablet Take 1,000 mg by mouth daily as needed for moderate pain (pain).     Historical Provider, MD  CRANBERRY PO Take 1 tablet by mouth daily.    Historical Provider, MD  diltiazem (CARDIZEM CD) 180 MG 24 hr capsule Take 1 capsule (180 mg total) by mouth daily. 05/31/13   Vesta Mixer, MD  lactose free nutrition (BOOST PLUS) LIQD Take 237 mLs by  mouth daily at 2 PM daily at 2 PM. 04/08/13   Ripudeep K Rai, MD  metoprolol tartrate (LOPRESSOR) 25 MG tablet Take 25 mg by mouth 2 (two) times daily.    Historical Provider, MD  Multiple Vitamins-Minerals (MULTIVITAMIN GUMMIES WOMENS) CHEW Chew by mouth daily.    Historical Provider, MD  Naphazoline HCl (CLEAR EYES OP) Place 1 drop into both eyes at bedtime as needed (dry eyes/ itching).    Historical Provider, MD  vitamin C (ASCORBIC ACID) 500 MG tablet Take 500 mg by mouth daily as needed (for immune support).    Historical Provider, MD   BP 171/111  Pulse 108  Temp(Src) 97.6 F (36.4 C) (Oral)  Resp 18  SpO2 98% Physical Exam  Nursing note and vitals reviewed. Constitutional: She is oriented to person, place, and time. She appears well-developed and well-nourished.  HENT:  Head: Normocephalic and atraumatic.  Eyes: Conjunctivae are normal. Right eye exhibits no discharge. Left eye exhibits no discharge.  Neck: Normal range of motion. Neck supple. No tracheal deviation present.  Cardiovascular: An irregularly irregular rhythm present. Tachycardia present.   Pulmonary/Chest: Effort normal and breath sounds normal.  Abdominal: Soft. She exhibits no distension. There is no tenderness. There is no guarding.  Musculoskeletal: She exhibits edema (pitting edema 1+ left lower extremity and 2+ right lower extremity) and tenderness (mild left hip with flexion).  Neurological: She is alert and oriented to person, place, and time. GCS eye subscore is 4. GCS verbal subscore is 5. GCS motor subscore is 6.  5+ strength in UE with f/e at major joints, pt feels subj decr strength and sensation left sided and 5 minus left lower ext Sensation to palpation intact in UE and LE. CNs 2-12 grossly intact.  EOMFI.  PERRL.   Finger nose and coordination intact bilateral.   Visual fields intact to finger testing.   Skin: Skin is warm. No rash noted.  Psychiatric: She has a normal mood and affect.    ED  Course  Procedures (including critical care time) Labs Review Labs Reviewed  BASIC METABOLIC PANEL - Abnormal; Notable for the following:    Potassium 3.5 (*)    Glucose, Bld 110 (*)    GFR calc non Af Amer 78 (*)    GFR calc Af Amer 90 (*)    All other components within normal limits  URINALYSIS, ROUTINE W REFLEX MICROSCOPIC - Abnormal; Notable for the following:    Hgb urine dipstick TRACE (*)    All other components within normal limits  HEMOGLOBIN A1C - Abnormal; Notable for the following:    Hemoglobin A1C 6.3 (*)    Mean Plasma Glucose 134 (*)    All other components within  normal limits  GLUCOSE, CAPILLARY - Abnormal; Notable for the following:    Glucose-Capillary 114 (*)    All other components within normal limits  GLUCOSE, CAPILLARY - Abnormal; Notable for the following:    Glucose-Capillary 105 (*)    All other components within normal limits  GLUCOSE, CAPILLARY - Abnormal; Notable for the following:    Glucose-Capillary 101 (*)    All other components within normal limits  GLUCOSE, CAPILLARY - Abnormal; Notable for the following:    Glucose-Capillary 103 (*)    All other components within normal limits  MRSA PCR SCREENING  CBC WITH DIFFERENTIAL  TROPONIN I  URINE MICROSCOPIC-ADD ON  URINE RAPID DRUG SCREEN (HOSP PERFORMED)  LIPID PANEL  GLUCOSE, CAPILLARY  GLUCOSE, CAPILLARY    Imaging Review No results found. Dg Chest 2 View  11/16/2013   CLINICAL DATA:  Chest pain and weakness. Left hip pain. Groin pain.  EXAM: CHEST  2 VIEW  COMPARISON:  05/09/2013  FINDINGS: The left basilar airspace opacity has significantly improved. However, there is bilateral interstitial accentuation along with underlying enlargement of the cardiopericardial silhouette. No definite airspace opacity. Tortuous thoracic aorta. Upper lumbar compression fractures appear similar in magnitude to the prior exam. Stable rim calcified splenic lesion.  IMPRESSION: 1. Mild cardiomegaly with  interstitial accentuation favoring interstitial edema. 2. The left lower lobe airspace opacity has intervally cleared. 3. Upper lumbar compression fractures.   Electronically Signed   By: Herbie Baltimore M.D.   On: 11/16/2013 10:52   Dg Hip Complete Left  11/16/2013   CLINICAL DATA:  Chest pain and weakness.  Left hip and groin pain.  EXAM: LEFT HIP - COMPLETE 2+ VIEW  COMPARISON:  04/05/2013.  FINDINGS: Frontal pelvis shows diffuse bony demineralization. The patient is status post bilateral hip replacement. No evidence for an acute fracture in the pelvis. AP and frog-leg lateral views of the hip show no evidence for periprosthetic fracture.  IMPRESSION: Osteopenia without acute bony findings.   Electronically Signed   By: Kennith Center M.D.   On: 11/16/2013 10:52   Ct Head Wo Contrast  11/16/2013   CLINICAL DATA:  Intermittent chest pain and dizziness.  Not eating.  EXAM: CT HEAD WITHOUT CONTRAST  TECHNIQUE: Contiguous axial images were obtained from the base of the skull through the vertex without intravenous contrast.  COMPARISON:  04/05/2013.  FINDINGS: There is patchy low attenuation throughout the subcortical and periventricular white matter compatible with chronic microvascular disease. Prominence of the sulci and ventricles identified consistent with brain atrophy. No evidence for acute brain infarct. No intracranial hemorrhage or mass. Similar appearance of left frontal lobe calcification, image 27/series 2. Opacification of right maxillary sinus with hyperdense material centrally is unchanged. The skull appears intact.  IMPRESSION: 1. No acute intracranial abnormalities. 2. Similar appearance of calcification within left frontal lobe. 3. Small vessel ischemic change and brain atrophy.   Electronically Signed   By: Signa Kell M.D.   On: 11/16/2013 11:44   Mri Brain Without Contrast  11/16/2013   CLINICAL DATA:  Awoke this morning with left foot numbness in weakness. Left arm clumsiness.  EXAM:  MRI HEAD WITHOUT CONTRAST  MRA HEAD WITHOUT CONTRAST  TECHNIQUE: Multiplanar, multiecho pulse sequences of the brain and surrounding structures were obtained without intravenous contrast. Angiographic images of the head were obtained using MRA technique without contrast.  COMPARISON:  Head CT same day  FINDINGS: MRI HEAD FINDINGS  Diffusion imaging does not show any acute or subacute infarction.  There are ordinary chronic small-vessel changes of the pons. No focal cerebellar insult. There are chronic small-vessel changes affecting the thalami, basal ganglia and throughout the cerebral hemispheric white matter. No cortical or large vessel territory infarction. No evidence of mass lesion, acute hemorrhage, hydrocephalus or extra-axial collection. Minimal hemosiderin deposition is seen throughout the brain related to the old small vessel infarctions. No pituitary mass. There is chronic appearing opacification of the right maxillary sinus. Major vessels at the base of the brain show flow.  MRA HEAD FINDINGS  Both internal carotid arteries are patent into the brain. 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 This pseudoaneurysm measures about 6 mm in size. There appear to be multiple abnormal vessels at the skull base, many intra osseous. I think this may he represent an AV fistula with intra osseous components. This is probably incidental and it would not appear to relate to the clinical presentation.  The anterior and middle cerebral vessels are patent without proximal stenosis, aneurysm or vascular malformation.  Both vertebral arteries are patent with the right being dominant. No basilar stenosis. Posterior circulation branch vessels appear normal. There is technical signal loss of the distal vertebral artery regions.  IMPRESSION: No acute brain finding. No acute infarction to explain the presenting symptoms.  Extensive chronic small  vessel ischemic changes throughout the brain as outlined above.  No major vessel occlusion or correctable proximal stenosis.  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 This would not likely relate to the presenting symptoms.   Electronically Signed   By: Paulina Fusi M.D.   On: 11/16/2013 20:07   Mr Maxine Glenn Head/brain Wo Cm  11/16/2013   CLINICAL DATA:  Awoke this morning with left foot numbness in weakness. Left arm clumsiness.  EXAM: MRI HEAD WITHOUT CONTRAST  MRA HEAD WITHOUT CONTRAST  TECHNIQUE: Multiplanar, multiecho pulse sequences of the brain and surrounding structures were obtained without intravenous contrast. Angiographic images of the head were obtained using MRA technique without contrast.  COMPARISON:  Head CT same day  FINDINGS: MRI HEAD FINDINGS  Diffusion imaging does not show any acute or subacute infarction. There are ordinary chronic small-vessel changes of the pons. No focal cerebellar insult. There are chronic small-vessel changes affecting the thalami, basal ganglia and throughout the cerebral hemispheric white matter. No cortical or large vessel territory infarction. No evidence of mass lesion, acute hemorrhage, hydrocephalus or extra-axial collection. Minimal hemosiderin deposition is seen throughout the brain related to the old small vessel infarctions. No pituitary mass. There is chronic appearing opacification of the right maxillary sinus. Major vessels at the base of the brain show flow.  MRA HEAD FINDINGS  Both internal carotid arteries are patent into the brain. 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 This pseudoaneurysm measures about 6 mm in size. There appear to be multiple abnormal vessels at the skull base, many intra osseous. I think this may he represent an AV fistula with intra osseous components. This  is probably incidental and it would not appear to relate to the clinical presentation.  The anterior and middle cerebral vessels are patent without proximal stenosis, aneurysm or vascular malformation.  Both vertebral arteries are patent with the right being dominant. No basilar stenosis. Posterior circulation branch vessels appear normal. There is technical signal loss of the distal vertebral artery  regions.  IMPRESSION: No acute brain finding. No acute infarction to explain the presenting symptoms.  Extensive chronic small vessel ischemic changes throughout the brain as outlined above.  No major vessel occlusion or correctable proximal stenosis.  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 This would not likely relate to the presenting symptoms.   Electronically Signed   By: Paulina FusiMark  Shogry M.D.   On: 11/16/2013 20:07    EKG Interpretation   Date/Time:  Wednesday November 16 2013 08:37:07 EDT Ventricular Rate:  103 PR Interval:    QRS Duration: 80 QT Interval:  312 QTC Calculation: 408 R Axis:   47 Text Interpretation:  Atrial fibrillation with rapid ventricular response  Nonspecific ST and T wave abnormality Abnormal ECG Similar to previous  Confirmed by Charne Mcbrien  MD, Wendy Mikles ([id number]) on 11/16/2013 9:08:12 AM      MDM   Final diagnoses:  Left sided numbness  Nausea  Atrial fibrillation with RVR  Near syncope   New left-sided numbness and weakness since yesterday, with age and vascular risk factors concerned for possible small stroke. Discussed with neurology for consult. Plan for observation telemetry.  Patient with nausea and dizziness, broad differential, plan for blood work screening, CT head and patient will be observed for strokelike symptoms. Urinalysis pending. CT no bleeding, asp ordered.    Neuro evaluated in ED.    Patient denies chest pain or shortness of breath, screening troponin  EKG   The patients results and plan were reviewed and discussed.   Any x-rays performed were personally reviewed by myself.   Differential diagnosis were considered with the presenting HPI.  Medications - No data to display  Filed Vitals:   11/16/13 0838  BP: 171/111  Pulse: 108  Temp: 97.6 F (36.4 C)  TempSrc: Oral  Resp: 18  SpO2: 98%    Final diagnoses:  Left sided numbness  Nausea  Atrial fibrillation with RVR  Near syncope    Admission/ observation were discussed with the admitting physician, patient and/or family and they are comfortable with the plan.      Enid SkeensJoshua M Ratasha Fabre, MD 11/23/13 [phone]

## 2013-11-16 NOTE — Evaluation (Signed)
Physical Therapy Evaluation Patient Details Name: [name] MRN: [id number] DOB: [birth year] Today's Date: 11/16/2013   History of Present Illness  Amber PressmanRuth T Berry is an 80 y.o. female who has known Afib but not on AC due to fall risk. She is not on ASA and daughter knows of no contraindications. Paitent awoke this AM and noted her left foot felt numb and she could not move it. She was able to ambulate (with her walker) to the bathroom where she noted she also had left knee pain. After using the bathroom she attempted to use her left hand to steady herself on the walker and noted her arm was clumsy. Daughter states these symptoms lasted for about a hour. Currently she is still having decreased sensation in the left foot. PMH: bilateral THA, wrist fx, OP, MVP, rectal cancer, dizziness, A-fib, syncope ,HTN, PNA.  Clinical Impression  Pt admitted with left sided paresthesia and left LE pain. Pt currently with functional limitations due to the deficits listed below (see PT Problem List). Pt ambulated 20', 2x, with RW and min A, no buckling of the left knee though pt reported that she felt that if she moved a certain way that it would buckle and that she had lateral knee pain in WB'ing that she likened to the pain she had when she broke her hip. She had lateral knee pain with resisted knee extension from a flexed position. She did not have this pain with resisted knee extension in an extended position. She did not have tenderness to palpation of the patella, medial/ lateral joint lines, or posterior knee.  Pt will benefit from skilled PT to increase their independence and safety with mobility to allow discharge to the venue listed below. PT will continue to follow.       Follow Up Recommendations No PT follow up    Equipment Recommendations  None recommended by PT    Recommendations for Other Services OT consult     Precautions / Restrictions Precautions Precautions: Fall Precaution Comments:  h/o multiple falls, she reports they always happen the same way, knee (s) go out and she hits the floor, sometimes preceded by dizziness Restrictions Weight Bearing Restrictions: No      Mobility  Bed Mobility Overal bed mobility: Modified Independent                Transfers Overall transfer level: Needs assistance Equipment used: Rolling walker (2 wheeled) Transfers: Sit to/from Stand Sit to Stand: Min guard         General transfer comment: pt stands cautiously and reports left knee pain, "like my leg is broken", in standing. No buckling noted with standing though pt appears to be lessening wt through left LE with by UE's on RW  Ambulation/Gait Ambulation/Gait assistance: Min assist Ambulation Distance (Feet): 20 Feet, 2x Assistive device: Rolling walker (2 wheeled) Gait Pattern/deviations: Step-through pattern;Decreased step length - right;Decreased step length - left Gait velocity: decreased   General Gait Details: pt reported several times that she just does not feel herself and that if she moves the wrong way that her left knee will buckle and she will fall. Min A given for pt safety given fear, no LOB noted.  Stairs            Wheelchair Mobility    Modified Rankin (Stroke Patients Only) Modified Rankin (Stroke Patients Only) Pre-Morbid Rankin Score: Moderate disability Modified Rankin: Moderately severe disability     Balance Overall balance assessment: Needs assistance Sitting-balance  support: No upper extremity supported;Feet supported Sitting balance-Leahy Scale: Good Sitting balance - Comments: can shift wt in sitting with no LOB and no support   Standing balance support: Single extremity supported;During functional activity Standing balance-Leahy Scale: Poor Standing balance comment: relies on UE support for balance, though she reports that this is not her usual                             Pertinent Vitals/Pain Pain  Assessment: Faces Faces Pain Scale: Hurts even more Pain Location: left knee with resisted extension in sitting Pain Intervention(s): Limited activity within patient's tolerance    Home Living Family/patient expects to be discharged to:: Private residence Living Arrangements: Children Available Help at Discharge: Family;Friend(s);Available 24 hours/day Type of Home: House Home Access: Stairs to enter Entrance Stairs-Rails: None Entrance Stairs-Number of Steps: 2 Home Layout: One level Home Equipment: Adaptive equipment;Shower seat;Walker - 4 wheels      Prior Function Level of Independence: Needs assistance   Gait / Transfers Assistance Needed: always ambulates with RW  ADL's / Homemaking Assistance Needed: dtr assists pt with LB bathing and dressing        Hand Dominance   Dominant Hand: Right    Extremity/Trunk Assessment   Upper Extremity Assessment: Defer to OT evaluation;LUE deficits/detail       LUE Deficits / Details: pt reports she still has sensory changes to the left hand, specifically that when she is holding something, she can't feel it   Lower Extremity Assessment: LLE deficits/detail   LLE Deficits / Details: pt very painful left lateral knee with resisted knee extension in sitting (though not in supine with knee in an already extended position) as well as during ambulation. Strength WFL on MMT though in standing, pt feels that the knee will buckle on her, especially when turning. Pt not tender to palpation of the patella, med or lateral joint lines or posterior knee. She reports that she really only feels dysfucntion when she is standing.   Cervical / Trunk Assessment: Kyphotic  Communication   Communication: No difficulties  Cognition Arousal/Alertness: Awake/alert Behavior During Therapy: WFL for tasks assessed/performed Overall Cognitive Status: Within Functional Limits for tasks assessed                      General Comments General  comments (skin integrity, edema, etc.): pt's daughter reports that cognitively, pt appears herself. Generally pt active within the home, has an exercise routine that she performs daily with wts    Exercises General Exercises - Lower Extremity Ankle Circles/Pumps: AROM;Both;10 reps;Supine      Assessment/Plan    PT Assessment Patient needs continued PT services  PT Diagnosis Difficulty walking;Acute pain   PT Problem List Pain;Decreased mobility;Decreased coordination;Impaired sensation;Decreased safety awareness;Decreased activity tolerance;Decreased balance  PT Treatment Interventions DME instruction;Gait training;Stair training;Functional mobility training;Therapeutic activities;Therapeutic exercise;Balance training;Neuromuscular re-education;Patient/family education   PT Goals (Current goals can be found in the Care Plan section) Acute Rehab PT Goals Patient Stated Goal: get home PT Goal Formulation: With patient Time For Goal Achievement: 11/30/13 Potential to Achieve Goals: Good    Frequency Min 3X/week   Barriers to discharge        Co-evaluation               End of Session Equipment Utilized During Treatment: Gait belt Activity Tolerance: Patient tolerated treatment well Patient left: in bed;with call bell/phone within reach;with family/visitor present Nurse  Communication: Mobility status    Functional Assessment Tool Used: clinical judgement Functional Limitation: Mobility: Walking and moving around Mobility: Walking and Moving Around Current Status [phone]): At least 20 percent but less than 40 percent impaired, limited or restricted Mobility: Walking and Moving Around Goal Status [phone]): At least 1 percent but less than 20 percent impaired, limited or restricted    Time: 1500-1530 PT Time Calculation (min): 30 min   Charges:   PT Evaluation $Initial PT Evaluation Tier I: 1 Procedure PT Treatments $Gait Training: 8-22 mins $Therapeutic Activity: 8-22  mins   PT G Codes:   Functional Assessment Tool Used: clinical judgement Functional Limitation: Mobility: Walking and moving around   [place], PT  Acute Rehab Services  [phone]  Lyanne Co 11/16/2013, 4:05 PM

## 2013-11-16 NOTE — ED Notes (Signed)
MD at bedside. 

## 2013-11-16 NOTE — Plan of Care (Signed)
Problem: Phase I Progression Outcomes Goal: Voiding-avoid urinary catheter unless indicated Outcome: Progressing Incontinent at times         

## 2013-11-16 NOTE — H&P (Signed)
History and Physical  [name]:[id number] DOB: 30-Apr-1914 DOA: 11/16/2013  Referring physician: Blane Ohara, EDP PCP: Egbert Garibaldi, NP  Outpatient Specialists:  1. Cardiology: Dr. Leodis Sias  Chief Complaint: Left-sided weakness and numbness  HPI: [name] is a 80 y.o. female with history of A. fib-not on anticoagulation secondary to fall risk, not on aspirin, frequent falls, HTN, MVP, presented to the ED with about complaints. She is not a great historian/inconsistent. Patient lives alone but has almost 24/7 supervision and assistance from her daughter. Patient states that she felt generally unwell all day yesterday without any specific complaints. At 6 AM on 11/16/13, she woke up and noticed weakness of left toes and numbness of dorsum of left foot and weakness of left upper extremity. She had difficulty holding things in her left hand. Associated with this, she had extreme dizziness/? Vertigo but denies losing consciousness. Daughter states that she and her son it to hold patient to avoid fall. Patient specifically denies chest pain, palpitations or dyspnea. No headaches. She states that she was able to walk and her left-sided numbness and weakness have significantly improved but not completely resolved. She complains of chronic bilateral hip pain of operated hips. In the ED, lab work was unremarkable. CT head did not show acute findings. Hospitalist admission requested for TIA/CVA workup. Neurology has been consulted by EDP.   Review of Systems: All systems reviewed and apart from history of presenting illness, are negative.  Past Medical History  Diagnosis Date  . Hypertension   . MVP (mitral valve prolapse)     Not mentioned on 08/2005 echo however  . OP (osteoporosis)   . History of rectal cancer   . Fracture of femoral neck, right 2010  . Wrist fracture     left  . Dizziness   . Blood transfusion     "my own blood"  . Headache(784.0)     "terrible  while going thru menopause; none since then"  . Arthritis   . Anxiety     "now & then; I get over it"; denies RX  . Syncope 06/10/11    "fell; I've been having these spells; off & on; last time was maybe 2 yr ago"  . Atrial fibrillation     08/2005, normal EF at that time  . Tachyarrhythmia 06/11/11    burst of ST  . Pneumonia 05/2013    "once"   Past Surgical History  Procedure Laterality Date  . Transthoracic echocardiogram  08/20/2005    EF 60%  . Colonoscopy    . Total hip arthroplasty      bilaterally  . Inguinal hernia repair      right  . Breast cyst excision      right  . Cataract extraction w/ intraocular lens  implant, bilateral    . Colectomy     Social History:  reports that she has never smoked. She has never used smokeless tobacco. She reports that she does not drink alcohol or use illicit drugs. Widowed. Lives alone but has almost 24/7 supervision from daughter.  Allergies  Allergen Reactions  . Sulfa Drugs Cross Reactors Hives and Other (See Comments)    Almost passed out  . Irbesartan Other (See Comments)    Caused heart to race    Family History  Problem Relation Age of Onset  . Heart disease Neg Hx     Prior to Admission medications   Medication Sig Start Date End Date Taking?  Authorizing Provider  acetaminophen (TYLENOL) 500 MG tablet Take 500-1,000 mg by mouth daily as needed for moderate pain (pain).    Yes Historical Provider, MD  Cranberry (SM CRANBERRY) 300 MG tablet Take 300 mg by mouth daily.   Yes Historical Provider, MD  diltiazem (CARDIZEM CD) 180 MG 24 hr capsule Take 1 capsule (180 mg total) by mouth daily. 05/31/13  Yes Vesta Mixer, MD  lactose free nutrition (BOOST PLUS) LIQD Take 237 mLs by mouth daily at 2 PM daily at 2 PM. 04/08/13  Yes Ripudeep K Rai, MD  metoprolol tartrate (LOPRESSOR) 25 MG tablet Take 25 mg by mouth 2 (two) times daily.   Yes Historical Provider, MD  Multiple Vitamins-Minerals (MULTIVITAMIN GUMMIES WOMENS) CHEW  Chew 1 each by mouth daily.    Yes Historical Provider, MD  Naphazoline HCl (CLEAR EYES OP) Place 1 drop into both eyes at bedtime as needed (dry eyes/ itching).   Yes Historical Provider, MD  vitamin C (ASCORBIC ACID) 500 MG tablet Take 500 mg by mouth daily as needed (for immune support).   Yes Historical Provider, MD   Physical Exam: Filed Vitals:   11/16/13 1045 11/16/13 1152 11/16/13 1200 11/16/13 1230  BP: 167/94 154/83 166/76 173/85  Pulse: 93 83 117 90  Temp:      TempSrc:      Resp: 18 20 21 20   SpO2: 96% 95% 93% 97%     General exam: Moderately built and nourished elderly female patient, lying comfortably supine on the gurney in no obvious distress.  Head, eyes and ENT: Nontraumatic and normocephalic. Pupils equally reacting to light and accommodation. Oral mucosa moist.  Neck: Supple. No JVD, carotid bruit or thyromegaly.  Lymphatics: No lymphadenopathy.  Respiratory system: Clear to auscultation. No increased work of breathing.  Cardiovascular system: S1 and S2 heard, RRR. No JVD, murmurs, gallops, clicks. Trace bilateral leg edema.  Gastrointestinal system: Abdomen is nondistended, soft and nontender. Normal bowel sounds heard. No organomegaly or masses appreciated.  Central nervous system: Alert and oriented. No focal neurological deficits.  Extremities: Symmetric 5 x 5 power. Left lower extremity power evaluation limited secondary to chronic knee pain. Peripheral pulses symmetrically felt. No sensory deficits.  Skin: No rashes or acute findings.  Musculoskeletal system: Negative exam.  Psychiatry: Pleasant and cooperative.   Labs on Admission:  Basic Metabolic Panel:  Recent Labs Lab 11/16/13 0930  NA 146  K 3.5*  CL 106  CO2 26  GLUCOSE 110*  BUN 16  CREATININE 0.52  CALCIUM 8.7   Liver Function Tests: No results found for this basename: AST, ALT, ALKPHOS, BILITOT, PROT, ALBUMIN,  in the last 168 hours No results found for this basename:  LIPASE, AMYLASE,  in the last 168 hours No results found for this basename: AMMONIA,  in the last 168 hours CBC:  Recent Labs Lab 11/16/13 0930  WBC 8.3  NEUTROABS 6.3  HGB 13.7  HCT 40.7  MCV 96.2  PLT 193   Cardiac Enzymes:  Recent Labs Lab 11/16/13 0930  TROPONINI <0.30    BNP (last 3 results)  Recent Labs  05/09/13 1328  PROBNP 2605.0*   CBG: No results found for this basename: GLUCAP,  in the last 168 hours  Radiological Exams on Admission: Dg Chest 2 View  11/16/2013   CLINICAL DATA:  Chest pain and weakness. Left hip pain. Groin pain.  EXAM: CHEST  2 VIEW  COMPARISON:  05/09/2013  FINDINGS: The left basilar airspace opacity has significantly  improved. However, there is bilateral interstitial accentuation along with underlying enlargement of the cardiopericardial silhouette. No definite airspace opacity. Tortuous thoracic aorta. Upper lumbar compression fractures appear similar in magnitude to the prior exam. Stable rim calcified splenic lesion.  IMPRESSION: 1. Mild cardiomegaly with interstitial accentuation favoring interstitial edema. 2. The left lower lobe airspace opacity has intervally cleared. 3. Upper lumbar compression fractures.   Electronically Signed   By: Herbie BaltimoreWalt  Liebkemann M.D.   On: 11/16/2013 10:52   Dg Hip Complete Left  11/16/2013   CLINICAL DATA:  Chest pain and weakness.  Left hip and groin pain.  EXAM: LEFT HIP - COMPLETE 2+ VIEW  COMPARISON:  04/05/2013.  FINDINGS: Frontal pelvis shows diffuse bony demineralization. The patient is status post bilateral hip replacement. No evidence for an acute fracture in the pelvis. AP and frog-leg lateral views of the hip show no evidence for periprosthetic fracture.  IMPRESSION: Osteopenia without acute bony findings.   Electronically Signed   By: Kennith CenterEric  Mansell M.D.   On: 11/16/2013 10:52   Ct Head Wo Contrast  11/16/2013   CLINICAL DATA:  Intermittent chest pain and dizziness.  Not eating.  EXAM: CT HEAD WITHOUT  CONTRAST  TECHNIQUE: Contiguous axial images were obtained from the base of the skull through the vertex without intravenous contrast.  COMPARISON:  04/05/2013.  FINDINGS: There is patchy low attenuation throughout the subcortical and periventricular white matter compatible with chronic microvascular disease. Prominence of the sulci and ventricles identified consistent with brain atrophy. No evidence for acute brain infarct. No intracranial hemorrhage or mass. Similar appearance of left frontal lobe calcification, image 27/series 2. Opacification of right maxillary sinus with hyperdense material centrally is unchanged. The skull appears intact.  IMPRESSION: 1. No acute intracranial abnormalities. 2. Similar appearance of calcification within left frontal lobe. 3. Small vessel ischemic change and brain atrophy.   Electronically Signed   By: Signa Kellaylor  Stroud M.D.   On: 11/16/2013 11:44    EKG: Independently reviewed. A. fib with ventricular rate of 103 per minute. Nonspecific ST-T changes without acute abnormalities.  Assessment/Plan Principal Problem:   TIA (transient ischemic attack) Active Problems:   HTN (hypertension)   Hyperlipidemia   Near syncope   A-fib   DNR (do not resuscitate)   Dizziness   1. Possible TIA with transient left-sided numbness and weakness: R/O CVA. Admit to telemetry for observation. Complete stroke workup-MRI/MRA brain without contrast, 2-D echo, carotid Dopplers, hemoglobin A1c and fasting lipids. Patient not on aspirin PTA-Will start aspirin 325 mg daily. PT and OT evaluation. Neurology input pending. 2. A. fib: Reasonable rate control. Continue home dose of Cardizem and metoprolol. Not on anticoagulation PTA secondary to fall risk. Not on aspirin PTA-starting. 3. Uncontrolled hypertension: Allow for permissive hypertension secondary to problem #1. Continue home dose of Cardizem and metoprolol and avoid aggressive BP control. 4. Dizziness/vertigo: Unclear etiology.?  Related to problem #1. Check orthostatic blood pressures. PT and OT evaluation. 5. History of frequent falls: PT evaluation.     Code Status: DO NOT RESUSCITATE  Family Communication: Discussed with patient's daughter at bedside.  Disposition Plan: Home when medically stable.   Time spent: 55 minutes.  Marcellus ScottHONGALGI,Kera Deacon, MD, FACP, FHM. [facility] Pager [phone]  If 7PM-7AM, please contact night-coverage [email or web address] Password [email or web address] 11/16/2013, 12:59 PM

## 2013-11-17 DIAGNOSIS — R200 Anesthesia of skin: Secondary | ICD-10-CM

## 2013-11-17 DIAGNOSIS — I059 Rheumatic mitral valve disease, unspecified: Secondary | ICD-10-CM

## 2013-11-17 DIAGNOSIS — I482 Chronic atrial fibrillation: Secondary | ICD-10-CM

## 2013-11-17 DIAGNOSIS — I4891 Unspecified atrial fibrillation: Secondary | ICD-10-CM

## 2013-11-17 DIAGNOSIS — M79609 Pain in unspecified limb: Secondary | ICD-10-CM

## 2013-11-17 DIAGNOSIS — R600 Localized edema: Secondary | ICD-10-CM

## 2013-11-17 LAB — LIPID PANEL
Cholesterol: 126 mg/dL (ref 0–200)
HDL: 56 mg/dL (ref >39)
LDL Cholesterol: 54 mg/dL (ref 0–99)
Total CHOL/HDL Ratio: 2.3 RATIO
Triglycerides: 81 mg/dL (ref <150)
VLDL: 16 mg/dL (ref 0–40)

## 2013-11-17 LAB — GLUCOSE, CAPILLARY
GLUCOSE-CAPILLARY: 99 mg/dL (ref 70–99)
Glucose-Capillary: 114 mg/dL — ABNORMAL HIGH (ref 70–99)
Glucose-Capillary: 105 mg/dL — ABNORMAL HIGH (ref 70–99)

## 2013-11-17 LAB — MRSA PCR SCREENING: MRSA by PCR: NEGATIVE

## 2013-11-17 MED ORDER — METOPROLOL TARTRATE 50 MG PO TABS
50.0000 mg | ORAL_TABLET | Freq: Two times a day (BID) | ORAL | Status: DC
  Administered 2013-11-17: 25 mg via ORAL
  Administered 2013-11-18: 50 mg via ORAL
  Filled 2013-11-17 (×4): qty 1

## 2013-11-17 MED ORDER — STROKE: EARLY STAGES OF RECOVERY BOOK
Freq: Once | Status: DC
  Filled 2013-11-17: qty 1

## 2013-11-17 NOTE — Progress Notes (Signed)
Physical Therapy Treatment Patient Details Name: [name] MRN: [id number] DOB: 11/26/1914 Today's Date: 11/17/2013    History of Present Illness [name] is an 80 y.o. female who has known Afib but not on AC due to fall risk. She is not on ASA and daughter knows of no contraindications. Paitent awoke this AM and noted her left foot felt numb and she could not move it. She was able to ambulate (with her walker) to the bathroom where she noted she also had left knee pain. After using the bathroom she attempted to use her left hand to steady herself on the walker and noted her arm was clumsy. Daughter states these symptoms lasted for about a hour. Currently she is still having decreased sensation in the left foot. PMH: bilateral THA, wrist fx, OP, MVP, rectal cancer, dizziness, A-fib, syncope ,HTN, PNA.    PT Comments    Pt is mod I to min guard for transfers with AD, ambulation with AD and bed mobility. Pt requires increased assistance with stairs 2/2 anxiety and weakness. Pt demonstrates and reports being nervous about ambulation and stairs due to falls. POC and d/c rec discussed with pt and daughter. Pt does not appear interested in having HHPT, but would benefit from additional PT after d/c.    Follow Up Recommendations  No PT follow up;Supervision - Intermittent     Equipment Recommendations  None recommended by PT    Recommendations for Other Services       Precautions / Restrictions Precautions Precautions: Fall Restrictions Weight Bearing Restrictions: No    Mobility  Bed Mobility Overal bed mobility: Modified Independent                Transfers Overall transfer level: Needs assistance Equipment used: Rolling walker (2 wheeled) Transfers: Sit to/from Stand Sit to Stand: Supervision         General transfer comment: Pt reports being nervous with standing. Heavy reliance on BUE for support. No LE buckling  Ambulation/Gait Ambulation/Gait assistance:  Min guard Ambulation Distance (Feet): 250 Feet Assistive device: Rolling walker (2 wheeled) Gait Pattern/deviations: Step-through pattern;Decreased stride length;Antalgic;Trunk flexed Gait velocity: decreased Gait velocity interpretation: Below normal speed for age/gender General Gait Details: pt amb well with guarding for safety. heavy reliance on UE for support.    Stairs Stairs: Yes Stairs assistance: +2 physical assistance Stair Management: One rail Left;Forwards Number of Stairs: 3 General stair comments: pt initially wanted to do stairs going up back wards using RW. PT instead had pt go up the stairs with handhold assist on one side and pt using rail on L. +2 assist for safety. Pt with a lot of anxiety about performing stairs and HR increase.  VC for technique.   Wheelchair Mobility    Modified Rankin (Stroke Patients Only)       Balance Overall balance assessment: Needs assistance Sitting-balance support: No upper extremity supported Sitting balance-Leahy Scale: Good     Standing balance support: Bilateral upper extremity supported;During functional activity Standing balance-Leahy Scale: Poor Standing balance comment: heavy reliance on UE                    Cognition Arousal/Alertness: Awake/alert Behavior During Therapy: WFL for tasks assessed/performed Overall Cognitive Status: Within Functional Limits for tasks assessed                      Exercises      General Comments General comments (skin integrity, edema, etc.):  Pt with anxiety about ambulation and stair 2/2 hx of falls. Pt encouraged to continue with exercises she had been performing at home PTA.       Pertinent Vitals/Pain Pain Assessment: No/denies pain    Home Living                      Prior Function            PT Goals (current goals can now be found in the care plan section) Acute Rehab PT Goals Patient Stated Goal: get home PT Goal Formulation: With  patient Time For Goal Achievement: 11/30/13 Potential to Achieve Goals: Good Progress towards PT goals: Progressing toward goals    Frequency  Min 3X/week    PT Plan Current plan remains appropriate    Co-evaluation             End of Session Equipment Utilized During Treatment: Gait belt Activity Tolerance: Patient tolerated treatment well Patient left: in chair;with call bell/phone within reach;with family/visitor present     Time: [id number] PT Time Calculation (min): 25 min  Charges:                       G Codes:      Milbert Bixler 11/17/2013, 12:55 PM Cathlyn ParsonsElizabeth Krysta Bloomfield, SPT

## 2013-11-17 NOTE — Progress Notes (Signed)
Subjective: Patient no longer has left foot numbness and left foot strength has returned to normal.   Objective: Current vital signs: BP 122/78  Pulse 121  Temp(Src) 98.4 F (36.9 C) (Oral)  Resp 18  Ht 5\' 3"  (1.6 m)  Wt 55.248 kg (121 lb 12.8 oz)  BMI 21.58 kg/m2  SpO2 97% Vital signs in last 24 hours: Temp:  [97.8 F (36.6 C)-98.4 F (36.9 C)] 98.4 F (36.9 C) (10/08 0743) Pulse Rate:  [72-121] 121 (10/08 0830) Resp:  [15-18] 18 (10/08 0743) BP: (122-182)/(66-101) 122/78 mmHg (10/08 0830) SpO2:  [96 %-99 %] 97 % (10/08 0743) Weight:  [55.248 kg (121 lb 12.8 oz)] 55.248 kg (121 lb 12.8 oz) (10/08 0628)  Intake/Output from previous day: 10/07 0701 - 10/08 0700 In: -  Out: 1350 [Urine:1350] Intake/Output this shift: Total I/O In: 240 [P.O.:240] Out: -  Nutritional status: Cardiac  Neurologic Exam: General: NAD Mental Status: Alert, oriented, thought content appropriate.  Speech fluent without evidence of aphasia.  Able to follow 3 step commands without difficulty. Cranial Nerves: II: Discs flat bilaterally; Visual fields grossly normal, pupils equal, round, reactive to light and accommodation III,IV, VI: ptosis not present, extra-ocular motions intact bilaterally V,VII: smile symmetric, facial light touch sensation normal bilaterally VIII: hearing normal bilaterally IX,X: gag reflex present XI: bilateral shoulder shrug XII: midline tongue extension without atrophy or fasciculations  Motor: Right : Upper extremity   5/5    Left:     Upper extremity   5/5  Lower extremity   5/5     Lower extremity   5/5 Tone and bulk:normal tone throughout; no atrophy noted Sensory: Pinprick and light touch intact throughout, bilaterally Deep Tendon Reflexes:  Right: Upper Extremity   Left: Upper extremity   biceps (C-5 to C-6) 2/4   biceps (C-5 to C-6) 2/4 tricep (C7) 2/4    triceps (C7) 2/4 Brachioradialis (C6) 2/4  Brachioradialis (C6) 2/4  Lower Extremity Lower Extremity   quadriceps (L-2 to L-4) 2/4   quadriceps (L-2 to L-4) 2/4 Achilles (S1) 1/4   Achilles (S1) 1/4  Plantars: Right: downgoing   Left: downgoing     Lab Results: Basic Metabolic Panel:  Recent Labs Lab 11/16/13 0930  NA 146  K 3.5*  CL 106  CO2 26  GLUCOSE 110*  BUN 16  CREATININE 0.52  CALCIUM 8.7    Liver Function Tests: No results found for this basename: AST, ALT, ALKPHOS, BILITOT, PROT, ALBUMIN,  in the last 168 hours No results found for this basename: LIPASE, AMYLASE,  in the last 168 hours No results found for this basename: AMMONIA,  in the last 168 hours  CBC:  Recent Labs Lab 11/16/13 0930  WBC 8.3  NEUTROABS 6.3  HGB 13.7  HCT 40.7  MCV 96.2  PLT 193    Cardiac Enzymes:  Recent Labs Lab 11/16/13 0930  TROPONINI <0.30    Lipid Panel:  Recent Labs Lab 11/17/13 0329  CHOL 126  TRIG 81  HDL 56  CHOLHDL 2.3  VLDL 16  LDLCALC 54    CBG:  Recent Labs Lab 11/17/13 0740 11/17/13 1119  GLUCAP 99 114*    Microbiology: Results for orders placed during the hospital encounter of 05/09/13  CULTURE, BLOOD (ROUTINE X 2)     Status: None   Collection Time    05/09/13  4:15 PM      Result Value Ref Range Status   Specimen Description BLOOD ARM LEFT   Final  Special Requests BOTTLES DRAWN AEROBIC AND ANAEROBIC 10CC   Final   Culture  Setup Time     Final   Value: 05/09/2013 20:36     Performed at [facility]   Culture     Final   Value: NO GROWTH 5 DAYS     Performed at [facility]   Report Status 05/15/2013 FINAL   Final  CULTURE, BLOOD (ROUTINE X 2)     Status: None   Collection Time    05/09/13  4:19 PM      Result Value Ref Range Status   Specimen Description BLOOD HAND LEFT   Final   Special Requests BOTTLES DRAWN AEROBIC ONLY 10CC   Final   Culture  Setup Time     Final   Value: 05/09/2013 20:37     Performed at [facility]   Culture     Final   Value: NO GROWTH 5 DAYS     Performed at [facility]   Report Status 05/15/2013 FINAL   Final  MRSA PCR SCREENING     Status: None   Collection Time    05/09/13  5:09 PM      Result Value Ref Range Status   MRSA by PCR NEGATIVE  NEGATIVE Final   Comment:            The GeneXpert MRSA Assay (FDA     approved for NASAL specimens     only), is one component of a     comprehensive MRSA colonization     surveillance program. It is not     intended to diagnose MRSA     infection nor to guide or     monitor treatment for     MRSA infections.    Coagulation Studies: No results found for this basename: LABPROT, INR,  in the last 72 hours  Imaging: Dg Chest 2 View  11/16/2013   CLINICAL DATA:  Chest pain and weakness. Left hip pain. Groin pain.  EXAM: CHEST  2 VIEW  COMPARISON:  05/09/2013  FINDINGS: The left basilar airspace opacity has significantly improved. However, there is bilateral interstitial accentuation along with underlying enlargement of the cardiopericardial silhouette. No definite airspace opacity. Tortuous thoracic aorta. Upper lumbar compression fractures appear similar in magnitude to the prior exam. Stable rim calcified splenic lesion.  IMPRESSION: 1. Mild cardiomegaly with interstitial accentuation favoring interstitial edema. 2. The left lower lobe airspace opacity has intervally cleared. 3. Upper lumbar compression fractures.   Electronically Signed   By: Herbie BaltimoreWalt  Liebkemann M.D.   On: 11/16/2013 10:52   Dg Hip Complete Left  11/16/2013   CLINICAL DATA:  Chest pain and weakness.  Left hip and groin pain.  EXAM: LEFT HIP - COMPLETE 2+ VIEW  COMPARISON:  04/05/2013.  FINDINGS: Frontal pelvis shows diffuse bony demineralization. The patient is status post bilateral hip replacement. No evidence for an acute fracture in the pelvis. AP and frog-leg lateral views of the hip show no evidence for periprosthetic fracture.  IMPRESSION: Osteopenia without acute bony findings.   Electronically Signed   By: Kennith CenterEric  Mansell M.D.   On:  11/16/2013 10:52   Ct Head Wo Contrast  11/16/2013   CLINICAL DATA:  Intermittent chest pain and dizziness.  Not eating.  EXAM: CT HEAD WITHOUT CONTRAST  TECHNIQUE: Contiguous axial images were obtained from the base of the skull through the vertex without intravenous contrast.  COMPARISON:  04/05/2013.  FINDINGS: There is patchy  low attenuation throughout the subcortical and periventricular white matter compatible with chronic microvascular disease. Prominence of the sulci and ventricles identified consistent with brain atrophy. No evidence for acute brain infarct. No intracranial hemorrhage or mass. Similar appearance of left frontal lobe calcification, image 27/series 2. Opacification of right maxillary sinus with hyperdense material centrally is unchanged. The skull appears intact.  IMPRESSION: 1. No acute intracranial abnormalities. 2. Similar appearance of calcification within left frontal lobe. 3. Small vessel ischemic change and brain atrophy.   Electronically Signed   By: Signa Kell M.D.   On: 11/16/2013 11:44   Mri Brain Without Contrast  11/16/2013   CLINICAL DATA:  Awoke this morning with left foot numbness in weakness. Left arm clumsiness.  EXAM: MRI HEAD WITHOUT CONTRAST  MRA HEAD WITHOUT CONTRAST  TECHNIQUE: Multiplanar, multiecho pulse sequences of the brain and surrounding structures were obtained without intravenous contrast. Angiographic images of the head were obtained using MRA technique without contrast.  COMPARISON:  Head CT same day  FINDINGS: MRI HEAD FINDINGS  Diffusion imaging does not show any acute or subacute infarction. There are ordinary chronic small-vessel changes of the pons. No focal cerebellar insult. There are chronic small-vessel changes affecting the thalami, basal ganglia and throughout the cerebral hemispheric white matter. No cortical or large vessel territory infarction. No evidence of mass lesion, acute hemorrhage, hydrocephalus or extra-axial collection.  Minimal hemosiderin deposition is seen throughout the brain related to the old small vessel infarctions. No pituitary mass. There is chronic appearing opacification of the right maxillary sinus. Major vessels at the base of the brain show flow.  MRA HEAD FINDINGS  Both internal carotid arteries are patent into the brain. 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 This pseudoaneurysm measures about 6 mm in size. There appear to be multiple abnormal vessels at the skull base, many intra osseous. I think this may he represent an AV fistula with intra osseous components. This is probably incidental and it would not appear to relate to the clinical presentation.  The anterior and middle cerebral vessels are patent without proximal stenosis, aneurysm or vascular malformation.  Both vertebral arteries are patent with the right being dominant. No basilar stenosis. Posterior circulation branch vessels appear normal. There is technical signal loss of the distal vertebral artery regions.  IMPRESSION: No acute brain finding. No acute infarction to explain the presenting symptoms.  Extensive chronic small vessel ischemic changes throughout the brain as outlined above.  No major vessel occlusion or correctable proximal stenosis.  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 This would not likely relate to the presenting symptoms.   Electronically Signed   By: Paulina Fusi M.D.   On: 11/16/2013 20:07   Mr Maxine Glenn Head/brain Wo Cm  11/16/2013   CLINICAL DATA:  Awoke this morning with left foot numbness in weakness. Left arm clumsiness.  EXAM: MRI HEAD WITHOUT CONTRAST  MRA HEAD WITHOUT CONTRAST  TECHNIQUE: Multiplanar, multiecho pulse sequences of the brain and surrounding structures were obtained without intravenous contrast. Angiographic images of the head were  obtained using MRA technique without contrast.  COMPARISON:  Head CT same day  FINDINGS: MRI HEAD FINDINGS  Diffusion imaging does not show any acute or subacute infarction. There are ordinary chronic small-vessel changes of the pons. No focal cerebellar insult. There are chronic small-vessel changes affecting the thalami, basal ganglia and throughout  the cerebral hemispheric white matter. No cortical or large vessel territory infarction. No evidence of mass lesion, acute hemorrhage, hydrocephalus or extra-axial collection. Minimal hemosiderin deposition is seen throughout the brain related to the old small vessel infarctions. No pituitary mass. There is chronic appearing opacification of the right maxillary sinus. Major vessels at the base of the brain show flow.  MRA HEAD FINDINGS  Both internal carotid arteries are patent into the brain. 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 This pseudoaneurysm measures about 6 mm in size. There appear to be multiple abnormal vessels at the skull base, many intra osseous. I think this may he represent an AV fistula with intra osseous components. This is probably incidental and it would not appear to relate to the clinical presentation.  The anterior and middle cerebral vessels are patent without proximal stenosis, aneurysm or vascular malformation.  Both vertebral arteries are patent with the right being dominant. No basilar stenosis. Posterior circulation branch vessels appear normal. There is technical signal loss of the distal vertebral artery regions.  IMPRESSION: No acute brain finding. No acute infarction to explain the presenting symptoms.  Extensive chronic small vessel ischemic changes throughout the brain as outlined above.  No major vessel occlusion or correctable proximal stenosis.  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 This would not likely relate to the presenting symptoms.   Electronically Signed   By: Paulina Fusi M.D.   On: 11/16/2013 20:07   VASCULAR LAB  PRELIMINARY PRELIMINARY PRELIMINARY PRELIMINARY  Carotid Dopplers completed.  Preliminary report: 1-39% ICA stenosis. Vertebral artery flow is antegrade.  KANADY, CANDACE, RVT  11/17/2013, 10:40 AM  Medications:  Scheduled: . aspirin  325 mg Oral Daily  . diltiazem  180 mg Oral Daily  . enoxaparin (LOVENOX) injection  40 mg Subcutaneous Q24H  . lactose free nutrition  237 mL Oral Q1400  . metoprolol tartrate  25 mg Oral BID    Assessment/Plan:  80 y.o. female presenting with left sided complaints that have nearly resolved. She has a history of atrial fibrillation on no anticoagulation (due to fall risk) or antiplatelet therapy. Head CT reviewed and shows no acute changes. Carotid dopplers show no stenosis, MRI shows no acute infarct.  LDL 54 and A1c 6.3 (recommend <5.7).  Patients symptoms now fully resolved.   Recommend: 1) agree with ASA 325 daily 2) No further recommendations per neurology.    Neurology will S/O    Felicie Morn PA-C [facility] [phone]  11/17/2013, 1:18 PM

## 2013-11-17 NOTE — Progress Notes (Signed)
[facility] PROGRESS NOTE  Amber PressmanRuth T Casa UJW:[id number]:[id number] DOB: 1914/06/12 DOA: 11/16/2013 PCP: Egbert GaribaldiMillsaps, [name], NP  Assessment/Plan: 1. TIA with transient left-sided numbness and weakness:     -back to baseline, MRI negative    -carotid duplex with 1-39% stenosis    -LDL 54, hbaic 6.3    -Continue aspirin 325 mg daily    -PT and OT evaluation    -Neuro following  2. A. fib: with RVR:     -HR in 120s to130s this am     -continue Metoprolol, Diltiazem, will increase dose if HR not improved     -now on ASA 325mg , has not been on anticoagulation due to fall risk  3. Uncontrolled hypertension:       -Continue home dose of Cardizem and metoprolol and avoid aggressive BP control  4. History of frequent falls: PT evaluation.  Code Status: DNR Family Communication: d/w daughter at bedside Disposition Plan: home tomorrow if HR better   Consultants:  Neuro  HPI/Subjective: Feels ok, felt HR racing earlier  Objective: Filed Vitals:   11/17/13 0830  BP: 122/78  Pulse: 121  Temp:   Resp:     Intake/Output Summary (Last 24 hours) at 11/17/13 1308 Last data filed at 11/17/13 0814  Gross per 24 hour  Intake    240 ml  Output   1150 ml  Net   -910 ml   Filed Weights   11/16/13 1311 11/17/13 0628  Weight: 55.611 kg (122 lb 9.6 oz) 55.248 kg (121 lb 12.8 oz)    Exam:   General: AAOx3  Cardiovascular: S1s2/Irregular rate and rhythm  Respiratory: CTAB  Abdomen: soft, Nt, BS present  Musculoskeletal: 1 plus ankle swelling  Data Reviewed: Basic Metabolic Panel:  Recent Labs Lab 11/16/13 0930  NA 146  K 3.5*  CL 106  CO2 26  GLUCOSE 110*  BUN 16  CREATININE 0.52  CALCIUM 8.7   Liver Function Tests: No results found for this basename: AST, ALT, ALKPHOS, BILITOT, PROT, ALBUMIN,  in the last 168 hours No results found for this basename: LIPASE, AMYLASE,  in the last 168 hours No results found for this basename: AMMONIA,  in the last 168  hours CBC:  Recent Labs Lab 11/16/13 0930  WBC 8.3  NEUTROABS 6.3  HGB 13.7  HCT 40.7  MCV 96.2  PLT 193   Cardiac Enzymes:  Recent Labs Lab 11/16/13 0930  TROPONINI <0.30   BNP (last 3 results)  Recent Labs  05/09/13 1328  PROBNP 2605.0*   CBG:  Recent Labs Lab 11/17/13 0740 11/17/13 1119  GLUCAP 99 114*    No results found for this or any previous visit (from the past 240 hour(s)).   Studies: Dg Chest 2 View  11/16/2013   CLINICAL DATA:  Chest pain and weakness. Left hip pain. Groin pain.  EXAM: CHEST  2 VIEW  COMPARISON:  05/09/2013  FINDINGS: The left basilar airspace opacity has significantly improved. However, there is bilateral interstitial accentuation along with underlying enlargement of the cardiopericardial silhouette. No definite airspace opacity. Tortuous thoracic aorta. Upper lumbar compression fractures appear similar in magnitude to the prior exam. Stable rim calcified splenic lesion.  IMPRESSION: 1. Mild cardiomegaly with interstitial accentuation favoring interstitial edema. 2. The left lower lobe airspace opacity has intervally cleared. 3. Upper lumbar compression fractures.   Electronically Signed   By: Herbie BaltimoreWalt  Liebkemann M.D.   On: 11/16/2013 10:52   Dg Hip Complete Left  11/16/2013   CLINICAL DATA:  Chest pain and weakness.  Left hip and groin pain.  EXAM: LEFT HIP - COMPLETE 2+ VIEW  COMPARISON:  04/05/2013.  FINDINGS: Frontal pelvis shows diffuse bony demineralization. The patient is status post bilateral hip replacement. No evidence for an acute fracture in the pelvis. AP and frog-leg lateral views of the hip show no evidence for periprosthetic fracture.  IMPRESSION: Osteopenia without acute bony findings.   Electronically Signed   By: Kennith Center M.D.   On: 11/16/2013 10:52   Ct Head Wo Contrast  11/16/2013   CLINICAL DATA:  Intermittent chest pain and dizziness.  Not eating.  EXAM: CT HEAD WITHOUT CONTRAST  TECHNIQUE: Contiguous axial images  were obtained from the base of the skull through the vertex without intravenous contrast.  COMPARISON:  04/05/2013.  FINDINGS: There is patchy low attenuation throughout the subcortical and periventricular white matter compatible with chronic microvascular disease. Prominence of the sulci and ventricles identified consistent with brain atrophy. No evidence for acute brain infarct. No intracranial hemorrhage or mass. Similar appearance of left frontal lobe calcification, image 27/series 2. Opacification of right maxillary sinus with hyperdense material centrally is unchanged. The skull appears intact.  IMPRESSION: 1. No acute intracranial abnormalities. 2. Similar appearance of calcification within left frontal lobe. 3. Small vessel ischemic change and brain atrophy.   Electronically Signed   By: Signa Kell M.D.   On: 11/16/2013 11:44   Mri Brain Without Contrast  11/16/2013   CLINICAL DATA:  Awoke this morning with left foot numbness in weakness. Left arm clumsiness.  EXAM: MRI HEAD WITHOUT CONTRAST  MRA HEAD WITHOUT CONTRAST  TECHNIQUE: Multiplanar, multiecho pulse sequences of the brain and surrounding structures were obtained without intravenous contrast. Angiographic images of the head were obtained using MRA technique without contrast.  COMPARISON:  Head CT same day  FINDINGS: MRI HEAD FINDINGS  Diffusion imaging does not show any acute or subacute infarction. There are ordinary chronic small-vessel changes of the pons. No focal cerebellar insult. There are chronic small-vessel changes affecting the thalami, basal ganglia and throughout the cerebral hemispheric white matter. No cortical or large vessel territory infarction. No evidence of mass lesion, acute hemorrhage, hydrocephalus or extra-axial collection. Minimal hemosiderin deposition is seen throughout the brain related to the old small vessel infarctions. No pituitary mass. There is chronic appearing opacification of the right maxillary sinus.  Major vessels at the base of the brain show flow.  MRA HEAD FINDINGS  Both internal carotid arteries are patent into the brain. 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 This pseudoaneurysm measures about 6 mm in size. There appear to be multiple abnormal vessels at the skull base, many intra osseous. I think this may he represent an AV fistula with intra osseous components. This is probably incidental and it would not appear to relate to the clinical presentation.  The anterior and middle cerebral vessels are patent without proximal stenosis, aneurysm or vascular malformation.  Both vertebral arteries are patent with the right being dominant. No basilar stenosis. Posterior circulation branch vessels appear normal. There is technical signal loss of the distal vertebral artery regions.  IMPRESSION: No acute brain finding. No acute infarction to explain the presenting symptoms.  Extensive chronic small vessel ischemic changes throughout the brain as outlined above.  No major vessel occlusion or correctable proximal stenosis.  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  This would not likely relate to the presenting symptoms.   Electronically Signed   By: Paulina Fusi M.D.   On: 11/16/2013 20:07   Mr Maxine Glenn Head/brain Wo Cm  11/16/2013   CLINICAL DATA:  Awoke this morning with left foot numbness in weakness. Left arm clumsiness.  EXAM: MRI HEAD WITHOUT CONTRAST  MRA HEAD WITHOUT CONTRAST  TECHNIQUE: Multiplanar, multiecho pulse sequences of the brain and surrounding structures were obtained without intravenous contrast. Angiographic images of the head were obtained using MRA technique without contrast.  COMPARISON:  Head CT same day  FINDINGS: MRI HEAD FINDINGS  Diffusion imaging does not show any acute or subacute infarction. There are ordinary  chronic small-vessel changes of the pons. No focal cerebellar insult. There are chronic small-vessel changes affecting the thalami, basal ganglia and throughout the cerebral hemispheric white matter. No cortical or large vessel territory infarction. No evidence of mass lesion, acute hemorrhage, hydrocephalus or extra-axial collection. Minimal hemosiderin deposition is seen throughout the brain related to the old small vessel infarctions. No pituitary mass. There is chronic appearing opacification of the right maxillary sinus. Major vessels at the base of the brain show flow.  MRA HEAD FINDINGS  Both internal carotid arteries are patent into the brain. 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 This pseudoaneurysm measures about 6 mm in size. There appear to be multiple abnormal vessels at the skull base, many intra osseous. I think this may he represent an AV fistula with intra osseous components. This is probably incidental and it would not appear to relate to the clinical presentation.  The anterior and middle cerebral vessels are patent without proximal stenosis, aneurysm or vascular malformation.  Both vertebral arteries are patent with the right being dominant. No basilar stenosis. Posterior circulation branch vessels appear normal. There is technical signal loss of the distal vertebral artery regions.  IMPRESSION: No acute brain finding. No acute infarction to explain the presenting symptoms.  Extensive chronic small vessel ischemic changes throughout the brain as outlined above.  No major vessel occlusion or correctable proximal stenosis.  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 This would not likely relate to the presenting symptoms.   Electronically Signed   By: Paulina Fusi M.D.   On: 11/16/2013 20:07    Scheduled Meds: .  aspirin  325 mg Oral Daily  . diltiazem  180 mg Oral Daily  . enoxaparin (LOVENOX) injection  40 mg Subcutaneous Q24H  . lactose free nutrition  237 mL Oral Q1400  . metoprolol tartrate  25 mg Oral BID   Continuous Infusions:  Antibiotics Given (last 72 hours)   None      Principal Problem:   TIA (transient ischemic attack) Active Problems:   HTN (hypertension)   Hyperlipidemia   Near syncope   A-fib   DNR (do not resuscitate)   Dizziness    Time spent:    [facility]  [facility] Pager [phone]. If 7PM-7AM, please contact night-coverage at [email or web address], password [facility] 11/17/2013, 1:08 PM  LOS: 1 day

## 2013-11-17 NOTE — Progress Notes (Signed)
Echocardiogram 2D Echocardiogram has been performed.  [name], [name] M 11/17/2013, 3:54 PM

## 2013-11-17 NOTE — Progress Notes (Signed)
Spoke with patient daughter regarding anxiety and safety with mobility at home, daughter states that there is support and that patient has necessary assist for stair negotiation and mobility. Educated on safety with stair negotiation.   Patient declines HHPT at this time despite recomendation, current POC remains appropriate.   Charlotte Crumbevon Teddrick Mallari, PT DPT  [phone]

## 2013-11-17 NOTE — Progress Notes (Signed)
VASCULAR LAB PRELIMINARY  PRELIMINARY  PRELIMINARY  PRELIMINARY  Carotid Dopplers completed.    Preliminary report:  1-39% ICA stenosis.  Vertebral artery flow is antegrade.  Toryn Mcclinton, RVT 11/17/2013, 10:40 AM

## 2013-11-17 NOTE — Progress Notes (Signed)
VASCULAR LAB PRELIMINARY  PRELIMINARY  PRELIMINARY  PRELIMINARY  Bilateral lower extremity venous Dopplers completed.    Preliminary report:  There is no DVT or SVT noted in the bilateral lower extremities.   Daemon Dowty, RVT 11/17/2013, 10:41 AM

## 2013-11-17 NOTE — Progress Notes (Signed)
OT Cancellation Note  Patient Details Name: Amber PressmanRuth T Berry MRN: [id number] DOB: Nov 26, 1914   Cancelled Treatment:    Reason Eval/Treat Not Completed: Patient at procedure or test/ unavailable (transport in room getting pt in w/c for test)  Dalene Robards A 11/17/2013, 9:41 AM

## 2013-11-17 NOTE — Progress Notes (Signed)
UR completed 

## 2013-11-18 DIAGNOSIS — M7989 Other specified soft tissue disorders: Secondary | ICD-10-CM | POA: Diagnosis present

## 2013-11-18 LAB — GLUCOSE, CAPILLARY
Glucose-Capillary: 101 mg/dL — ABNORMAL HIGH (ref 70–99)
Glucose-Capillary: 103 mg/dL — ABNORMAL HIGH (ref 70–99)
Glucose-Capillary: 85 mg/dL (ref 70–99)

## 2013-11-18 MED ORDER — METOPROLOL TARTRATE 50 MG PO TABS
50.0000 mg | ORAL_TABLET | Freq: Two times a day (BID) | ORAL | Status: DC
Start: 2013-11-18 — End: 2013-12-07

## 2013-11-18 MED ORDER — ASPIRIN 325 MG PO TABS: 325.0000 mg | ORAL_TABLET | Freq: Every day | ORAL | Status: DC

## 2013-11-18 MED ORDER — FUROSEMIDE 20 MG PO TABS: 20.0000 mg | ORAL_TABLET | ORAL | Status: DC | PRN

## 2013-11-18 NOTE — Discharge Instructions (Signed)

## 2013-11-18 NOTE — Evaluation (Signed)
Occupational Therapy Evaluation Patient Details Name: [name] MRN: [id number] DOB: 1915-01-30 Today's Date: 11/18/2013    History of Present Illness Amber PressmanRuth T Dols is an 80 y.o. female who has known Afib but not on AC due to fall risk. She is not on ASA and daughter knows of no contraindications. Paitent awoke this AM and noted her left foot felt numb and she could not move it. She was able to ambulate (with her walker) to the bathroom where she noted she also had left knee pain. After using the bathroom she attempted to use her left hand to steady herself on the walker and noted her arm was clumsy. Daughter states these symptoms lasted for about a hour. Currently she is still having decreased sensation in the left foot. PMH: bilateral THA, wrist fx, OP, MVP, rectal cancer, dizziness, A-fib, syncope ,HTN, PNA.   Clinical Impression   Pt admitted for the above diagnosis and has the deficits listed below. Pt would benefit from cont OT to increase I with basic safety during adls on her feet so she can d/c home safely with her daughter assisting.    Follow Up Recommendations  No OT follow up;Supervision/Assistance - 24 hour    Equipment Recommendations  None recommended by OT    Recommendations for Other Services       Precautions / Restrictions Precautions Precautions: Fall Precaution Comments: h/o multiple falls, she reports they always happen the same way, knee (s) go out and she hits the floor, sometimes preceded by dizziness Restrictions Weight Bearing Restrictions: No      Mobility Bed Mobility Overal bed mobility: Modified Independent                Transfers Overall transfer level: Needs assistance Equipment used: Rolling walker (2 wheeled) Transfers: Sit to/from Stand Sit to Stand: Supervision         General transfer comment: Pt reports being nervous with standing. Heavy reliance on BUE for support. No LE buckling    Balance Overall balance assessment:  Needs assistance Sitting-balance support: No upper extremity supported;Feet supported Sitting balance-Leahy Scale: Good Sitting balance - Comments: can shift wt in sitting with no LOB and no support   Standing balance support: Bilateral upper extremity supported;During functional activity Standing balance-Leahy Scale: Poor Standing balance comment: pt must have walker when on her feet.                            ADL Overall ADL's : Needs assistance/impaired Eating/Feeding: Set up;Sitting   Grooming: Oral care;Wash/dry face;Wash/dry hands;Min guard;Standing Grooming Details (indicate cue type and reason): Pt required guarding while standing at sink.  Pt very anxious when on feet.  HR up to 160 while standing. Upper Body Bathing: Set up;Sitting   Lower Body Bathing: Minimal assistance;Sit to/from stand Lower Body Bathing Details (indicate cue type and reason): min assist when on feet washing bottom side. Upper Body Dressing : Set up;Sitting   Lower Body Dressing: Minimal assistance;Sit to/from stand Lower Body Dressing Details (indicate cue type and reason): min assist to maintain balance while standing to pull up pants.  Pt cannot let go of walker to pull pants up. Toilet Transfer: Min guard;BSC (used BSC since HR up to 160 when standing.) Toilet Transfer Details (indicate cue type and reason): [facility]  Pt states when she has to go it happens quickly at home too.  Sometimes pt states she  wears pads or depends at home. Toileting- Clothing Manipulation and Hygiene: Moderate assistance;Sit to/from stand Toileting - Clothing Manipulation Details (indicate cue type and reason): Pt needed assist pulling pants up bc she could not let go of walker to pull pants up.     Functional mobility during ADLs: Min guard;Rolling walker General ADL Comments: Pt overall does well with adls.  Pt is very anxious when she is on her feet and her HR goes up therefore  therapy was limited today.     Vision                     Perception Perception Perception Tested?: Yes   Praxis Praxis Praxis tested?: Within functional limits    Pertinent Vitals/Pain Pain Assessment: No/denies pain Faces Pain Scale: No hurt     Hand Dominance Right   Extremity/Trunk Assessment Upper Extremity Assessment Upper Extremity Assessment: Overall WFL for tasks assessed LUE Deficits / Details: Pt feels all sensory issues have resolved.  LUE and RUE are equally strong and sensation appears intack.   Lower Extremity Assessment Lower Extremity Assessment: Defer to PT evaluation   Cervical / Trunk Assessment Cervical / Trunk Assessment: Kyphotic   Communication Communication Communication: No difficulties   Cognition Arousal/Alertness: Awake/alert Behavior During Therapy: WFL for tasks assessed/performed Overall Cognitive Status: No family/caregiver present to determine baseline cognitive functioning       Memory: Decreased short-term memory             General Comments       Exercises       Shoulder Instructions      Home Living Family/patient expects to be discharged to:: Private residence Living Arrangements: Children Available Help at Discharge: Family;Friend(s);Available 24 hours/day Type of Home: House Home Access: Stairs to enter [employer] of Steps: 2 Entrance Stairs-Rails: None Home Layout: One level     Bathroom Shower/Tub: Producer, television/film/video: Handicapped height Bathroom Accessibility: Yes How Accessible: Accessible via walker Home Equipment: Bedside commode;Walker - 4 wheels Adaptive Equipment: Reacher Additional Comments: Per pt she will have 24/7 assist at home      Prior Functioning/Environment Level of Independence: Needs assistance  Gait / Transfers Assistance Needed: always ambulates with RW ADL's / Homemaking Assistance Needed: dtr assists pt with LB bathing and  dressing Communication / Swallowing Assistance Needed: none Comments: Pt can dress and bathe LEs but daughter often assists.  Pt with multiple falls.    OT Diagnosis: Generalized weakness;Cognitive deficits   OT Problem List: Decreased activity tolerance;Impaired balance (sitting and/or standing);Impaired vision/perception;Decreased cognition;Decreased knowledge of use of DME or AE   OT Treatment/Interventions: Self-care/ADL training;Therapeutic activities    OT Goals(Current goals can be found in the care plan section) Acute Rehab OT Goals Patient Stated Goal: get home OT Goal Formulation: With patient Time For Goal Achievement: 11/25/13 Potential to Achieve Goals: Good ADL Goals Pt Will Perform Grooming: with supervision;standing Pt Will Perform Lower Body Bathing: with supervision;sit to/from stand Pt Will Perform Lower Body Dressing: with supervision;sit to/from stand Pt Will Perform Tub/Shower Transfer: with supervision;shower seat;rolling walker;Shower transfer Additional ADL Goal #1: Pt will complete all aspects of toileting with S on comfort commode.  OT Frequency: Min 2X/week   Barriers to D/C:    pt mentioned moving in with daughter.  Do feel pt needs 24/7 S.  At times, pt was left alone at night.  Pt should not be left alone at this time.  Co-evaluation              End of Session Equipment Utilized During Treatment: Engineer, waterolling walker Nurse Communication: Mobility status;Other (comment) (HR up)  Activity Tolerance: Other (comment);Treatment limited secondary to medical complications (Comment) (pt with HR up to 160 w little activity.) Patient left: in bed;with call bell/phone within reach   Time: 0840-0912 OT Time Calculation (min): 32 min Charges:  OT General Charges $OT Visit: 1 Procedure OT Evaluation $Initial OT Evaluation Tier I: 1 Procedure OT Treatments $Self Care/Home Management : 23-37 mins G-Codes: OT G-codes **NOT FOR INPATIENT  CLASS** Functional Assessment Tool Used: clinical judgement Functional Limitation: Self care Self Care Current Status ([id number]): At least 1 percent but less than 20 percent impaired, limited or restricted Self Care Goal Status (U1324(G8988): At least 1 percent but less than 20 percent impaired, limited or restricted  Hope BuddsJones, Korion Cuevas Anne 11/18/2013, 9:29 AM [phone]

## 2013-11-18 NOTE — Progress Notes (Signed)
Physical Therapy Treatment Patient Details Name: Lenice PressmanRuth T Kuhlmann MRN: [id number] DOB: 08-01-1914 Today's Date: 11/18/2013    History of Present Illness Lenice PressmanRuth T Goynes is an 80 y.o. female who has known Afib but not on AC due to fall risk. She is not on ASA and daughter knows of no contraindications. Paitent awoke this AM and noted her left foot felt numb and she could not move it. She was able to ambulate (with her walker) to the bathroom where she noted she also had left knee pain. After using the bathroom she attempted to use her left hand to steady herself on the walker and noted her arm was clumsy. Daughter states these symptoms lasted for about a hour. Currently she is still having decreased sensation in the left foot. PMH: bilateral THA, wrist fx, OP, MVP, rectal cancer, dizziness, A-fib, syncope ,HTN, PNA.    PT Comments    Patient progressing well with mobility. Continues to report anxiety with stairs however able to subside these anxious feelings with breathing techniques and performing familiar methods to negotiate steps. Pt feeling more steady and "free" during ambulation today improved towards end of session. Encouraged pt to continue to perform exercises daily. Will continue to follow and progress as tolerated. Pt declining HHPT at this time even though pt would benefit from follow up therapy at d/c.   Follow Up Recommendations  No PT follow up;Supervision - Intermittent     Equipment Recommendations  None recommended by PT    Recommendations for Other Services       Precautions / Restrictions Precautions Precautions: Fall Precaution Comments: h/o multiple falls, she reports they always happen the same way, knee (s) go out and she hits the floor, sometimes preceded by dizziness Restrictions Weight Bearing Restrictions: No    Mobility  Bed Mobility Overal bed mobility: Modified Independent                Transfers Overall transfer level: Needs  assistance Equipment used: Rolling walker (2 wheeled) Transfers: Sit to/from Stand Sit to Stand: Supervision         General transfer comment: Supervision for safety as pt reports anxiety.  Ambulation/Gait Ambulation/Gait assistance: Min guard Ambulation Distance (Feet): 300 Feet Assistive device: Rolling walker (2 wheeled) Gait Pattern/deviations: Step-through pattern;Decreased stride length;Trunk flexed Gait velocity: 1.6 ft/sec   General Gait Details: Min guard initially due to feeling anxious progressing to supervision during ambulation. Multiple short standing rest breaks.    Stairs Stairs: Yes Stairs assistance: Min assist Stair Management: Backwards;With walker Number of Stairs: 2 General stair comments: Pt requested to perform stairs backwards using RW as done at home. Daughter present to assist. VC for pursed lip breathing to relieve anxiety. Increased time. Min A for balance/safety.  Wheelchair Mobility    Modified Rankin (Stroke Patients Only)       Balance Overall balance assessment: Needs assistance Sitting-balance support: No upper extremity supported;Feet supported Sitting balance-Leahy Scale: Good Sitting balance - Comments: Can reach outside BoS and shift weight with no LOB.   Standing balance support: During functional activity;Bilateral upper extremity supported Standing balance-Leahy Scale: Poor Standing balance comment: Requires UE support during gait and standing for safety.                    Cognition Arousal/Alertness: Awake/alert Behavior During Therapy: WFL for tasks assessed/performed Overall Cognitive Status: Within Functional Limits for tasks assessed       Memory: Decreased short-term memory  Exercises General Exercises - Lower Extremity Ankle Circles/Pumps: Both;15 reps;Seated Long Arc Quad: Both;10 reps;Seated Hip Flexion/Marching: Both;10 reps;Seated    General Comments General comments (skin  integrity, edema, etc.): Pt anxious about having a new therapist and to negotiate steps secondary to hx of falls. With cues for breathing and relaxation, anxiety subsided. Encouraged pt to perform exercises as done at home PTA.      Pertinent Vitals/Pain Pain Assessment: No/denies pain Faces Pain Scale: No hurt    Home Living Family/patient expects to be discharged to:: Private residence Living Arrangements: Children Available Help at Discharge: Family;Friend(s);Available 24 hours/day Type of Home: House Home Access: Stairs to enter Entrance Stairs-Rails: None Home Layout: One level Home Equipment: Bedside commode;Walker - 4 wheels Additional Comments: Per pt she will have 24/7 assist at home    Prior Function Level of Independence: Needs assistance  Gait / Transfers Assistance Needed: always ambulates with RW ADL's / Homemaking Assistance Needed: dtr assists pt with LB bathing and dressing Comments: Pt can dress and bathe LEs but daughter often assists.  Pt with multiple falls.   PT Goals (current goals can now be found in the care plan section) Acute Rehab PT Goals Patient Stated Goal: get home Progress towards PT goals: Progressing toward goals    Frequency  Min 3X/week    PT Plan Current plan remains appropriate    Co-evaluation             End of Session Equipment Utilized During Treatment: Gait belt Activity Tolerance: Patient tolerated treatment well Patient left: in chair;with call bell/phone within reach;with family/visitor present     Time: 1200-1227 PT Time Calculation (min): 27 min  Charges:  $Gait Training: 23-37 mins                    G CodesAlvie Heidelberg:      Folan, Mally Gavina A 11/18/2013, 12:35 PM Alvie HeidelbergShauna Folan, PT, DPT [phone]

## 2013-11-21 NOTE — Discharge Summary (Addendum)
Physician Discharge Summary  [name] RUE:[id number] DOB: 1914/04/15 DOA: 11/16/2013  PCP: Egbert GaribaldiMillsaps, [name], NP  Admit date: 11/16/2013 Discharge date: 11/18/2013  Time spent: 45 minutes  Recommendations for Outpatient Follow-up:  1. PCP in 1 week  Discharge Diagnoses:  Principal Problem:   TIA (transient ischemic attack)   Afib with RVR   HTN (hypertension)   Hyperlipidemia   Near syncope   A-fib   DNR (do not resuscitate)   Dizziness   Leg swelling   Discharge Condition: stable  Diet recommendation: heart healthy  Filed Weights   11/16/13 1311 11/17/13 0628 11/18/13 0642  Weight: 55.611 kg (122 lb 9.6 oz) 55.248 kg (121 lb 12.8 oz) 52.164 kg (115 lb)    History of present illness:  Chief Complaint: Left-sided weakness and numbness  HPI: [name] is a 80 y.o. female with history of A. fib-not on anticoagulation secondary to fall risk, not on aspirin, frequent falls, HTN, MVP, presented to the ED with L sided weakness. Hsitory was inconsistent. Patient lives alone but has almost 24/7 supervision and assistance from her daughter. Patient reported that she felt generally unwell all day 10/6 without any specific complaints. At 6 AM on 11/16/13, she woke up and noticed weakness of left toes and numbness of dorsum of left foot and weakness of left upper extremity   Hospital Course:  1. TIA with transient left-sided numbness and weakness:  -back to baseline, MRI negative  -carotid duplex with 1-39% stenosis, EF 45-50% -LDL 54, hbaic 6.3  -Continue aspirin 325 mg daily  -PT and OT evaluation  -Followed by Neurology  2. A. fib: with RVR: -HR in 120s to130s 10/8  - Metoprolol dose was increased and , Diltiazem continued at home dose  -now on ASA 325mg , has not been on anticoagulation due to fall risk  -HR controlled on this regimen at discharge  3. Uncontrolled hypertension:  -Continue home dose of Cardizem and metoprolol increased   4. History of frequent  falls: PT evaluation completed and HH PT arranged  5. Also has had chronic leg swelling, which is intermittent -has mild cardiomyopathy on ECHO, given advanced age ischemic eval not pursued -advised salt restriction and low dose PRN lasix advised   Consultations:  Neurology  Discharge Exam: Filed Vitals:   11/18/13 1154  BP: 165/81  Pulse: 78  Temp: 97.8 F (36.6 C)  Resp: 16    General: AAOx3 Cardiovascular: S1S2/RRR Respiratory: CTAB  Discharge Instructions You were cared for by a hospitalist during your hospital stay. If you have any questions about your discharge medications or the care you received while you were in the hospital after you are discharged, you can call the unit and asked to speak with the hospitalist on call if the hospitalist that took care of you is not available. Once you are discharged, your primary care physician will handle any further medical issues. Please note that NO REFILLS for any discharge medications will be authorized once you are discharged, as it is imperative that you return to your primary care physician (or establish a relationship with a primary care physician if you do not have one) for your aftercare needs so that they can reassess your need for medications and monitor your lab values.  Discharge Instructions   Diet - low sodium heart healthy    Complete by:  As directed      Increase activity slowly    Complete by:  As directed  Discharge Medication List as of 11/18/2013 12:38 PM    START taking these medications   Details  aspirin 325 MG tablet Take 1 tablet (325 mg total) by mouth daily., Starting 11/18/2013, Until Discontinued, OTC    furosemide (LASIX) 20 MG tablet Take 1 tablet (20 mg total) by mouth as needed (take as needed for incraesed leg swelling, do not use more than once a day)., Starting 11/18/2013, Until Discontinued, Print      CONTINUE these medications which have CHANGED   Details  metoprolol tartrate  (LOPRESSOR) 50 MG tablet Take 1 tablet (50 mg total) by mouth 2 (two) times daily., Starting 11/18/2013, Until Discontinued, Print      CONTINUE these medications which have NOT CHANGED   Details  acetaminophen (TYLENOL) 500 MG tablet Take 500-1,000 mg by mouth daily as needed for moderate pain (pain). , Until Discontinued, Historical Med    Cranberry (SM CRANBERRY) 300 MG tablet Take 300 mg by mouth daily., Until Discontinued, Historical Med    lactose free nutrition (BOOST PLUS) LIQD Take 237 mLs by mouth daily at 2 PM daily at 2 PM., Starting 04/08/2013, Until Discontinued, No Print    Multiple Vitamins-Minerals (MULTIVITAMIN GUMMIES WOMENS) CHEW Chew 1 each by mouth daily. , Until Discontinued, Historical Med    Naphazoline HCl (CLEAR EYES OP) Place 1 drop into both eyes at bedtime as needed (dry eyes/ itching)., Until Discontinued, Historical Med    vitamin C (ASCORBIC ACID) 500 MG tablet Take 500 mg by mouth daily as needed (for immune support)., Until Discontinued, Historical Med    CARTIA XT 180 MG 24 hr capsule TAKE 1 CAPSULE BY MOUTH EVERY DAY, Normal       Allergies  Allergen Reactions  . Sulfa Drugs Cross Reactors Hives and Other (See Comments)    Almost passed out  . Irbesartan Other (See Comments)    Caused heart to race      The results of significant diagnostics from this hospitalization (including imaging, microbiology, ancillary and laboratory) are listed below for reference.    Significant Diagnostic Studies: Dg Chest 2 View  11/16/2013   CLINICAL DATA:  Chest pain and weakness. Left hip pain. Groin pain.  EXAM: CHEST  2 VIEW  COMPARISON:  05/09/2013  FINDINGS: The left basilar airspace opacity has significantly improved. However, there is bilateral interstitial accentuation along with underlying enlargement of the cardiopericardial silhouette. No definite airspace opacity. Tortuous thoracic aorta. Upper lumbar compression fractures appear similar in magnitude to  the prior exam. Stable rim calcified splenic lesion.  IMPRESSION: 1. Mild cardiomegaly with interstitial accentuation favoring interstitial edema. 2. The left lower lobe airspace opacity has intervally cleared. 3. Upper lumbar compression fractures.   Electronically Signed   By: Herbie Baltimore M.D.   On: 11/16/2013 10:52   Dg Hip Complete Left  11/16/2013   CLINICAL DATA:  Chest pain and weakness.  Left hip and groin pain.  EXAM: LEFT HIP - COMPLETE 2+ VIEW  COMPARISON:  04/05/2013.  FINDINGS: Frontal pelvis shows diffuse bony demineralization. The patient is status post bilateral hip replacement. No evidence for an acute fracture in the pelvis. AP and frog-leg lateral views of the hip show no evidence for periprosthetic fracture.  IMPRESSION: Osteopenia without acute bony findings.   Electronically Signed   By: Kennith Center M.D.   On: 11/16/2013 10:52   Ct Head Wo Contrast  11/16/2013   CLINICAL DATA:  Intermittent chest pain and dizziness.  Not eating.  EXAM: CT HEAD  WITHOUT CONTRAST  TECHNIQUE: Contiguous axial images were obtained from the base of the skull through the vertex without intravenous contrast.  COMPARISON:  04/05/2013.  FINDINGS: There is patchy low attenuation throughout the subcortical and periventricular white matter compatible with chronic microvascular disease. Prominence of the sulci and ventricles identified consistent with brain atrophy. No evidence for acute brain infarct. No intracranial hemorrhage or mass. Similar appearance of left frontal lobe calcification, image 27/series 2. Opacification of right maxillary sinus with hyperdense material centrally is unchanged. The skull appears intact.  IMPRESSION: 1. No acute intracranial abnormalities. 2. Similar appearance of calcification within left frontal lobe. 3. Small vessel ischemic change and brain atrophy.   Electronically Signed   By: Signa Kell M.D.   On: 11/16/2013 11:44   Mri Brain Without Contrast  11/16/2013    CLINICAL DATA:  Awoke this morning with left foot numbness in weakness. Left arm clumsiness.  EXAM: MRI HEAD WITHOUT CONTRAST  MRA HEAD WITHOUT CONTRAST  TECHNIQUE: Multiplanar, multiecho pulse sequences of the brain and surrounding structures were obtained without intravenous contrast. Angiographic images of the head were obtained using MRA technique without contrast.  COMPARISON:  Head CT same day  FINDINGS: MRI HEAD FINDINGS  Diffusion imaging does not show any acute or subacute infarction. There are ordinary chronic small-vessel changes of the pons. No focal cerebellar insult. There are chronic small-vessel changes affecting the thalami, basal ganglia and throughout the cerebral hemispheric white matter. No cortical or large vessel territory infarction. No evidence of mass lesion, acute hemorrhage, hydrocephalus or extra-axial collection. Minimal hemosiderin deposition is seen throughout the brain related to the old small vessel infarctions. No pituitary mass. There is chronic appearing opacification of the right maxillary sinus. Major vessels at the base of the brain show flow.  MRA HEAD FINDINGS  Both internal carotid arteries are patent into the brain. 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 This pseudoaneurysm measures about 6 mm in size. There appear to be multiple abnormal vessels at the skull base, many intra osseous. I think this may he represent an AV fistula with intra osseous components. This is probably incidental and it would not appear to relate to the clinical presentation.  The anterior and middle cerebral vessels are patent without proximal stenosis, aneurysm or vascular malformation.  Both vertebral arteries are patent with the right being dominant. No basilar stenosis. Posterior circulation branch vessels appear normal. There is technical signal loss of the distal vertebral artery regions.  IMPRESSION: No  acute brain finding. No acute infarction to explain the presenting symptoms.  Extensive chronic small vessel ischemic changes throughout the brain as outlined above.  No major vessel occlusion or correctable proximal stenosis.  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 This would not likely relate to the presenting symptoms.   Electronically Signed   By: Paulina Fusi M.D.   On: 11/16/2013 20:07   Mr Maxine Glenn Head/brain Wo Cm  11/16/2013   CLINICAL DATA:  Awoke this morning with left foot numbness in weakness. Left arm clumsiness.  EXAM: MRI HEAD WITHOUT CONTRAST  MRA HEAD WITHOUT CONTRAST  TECHNIQUE: Multiplanar, multiecho pulse sequences of the brain and surrounding structures were obtained without intravenous contrast. Angiographic images of the head were obtained using MRA technique without contrast.  COMPARISON:  Head CT same day  FINDINGS: MRI HEAD FINDINGS  Diffusion imaging does not show  any acute or subacute infarction. There are ordinary chronic small-vessel changes of the pons. No focal cerebellar insult. There are chronic small-vessel changes affecting the thalami, basal ganglia and throughout the cerebral hemispheric white matter. No cortical or large vessel territory infarction. No evidence of mass lesion, acute hemorrhage, hydrocephalus or extra-axial collection. Minimal hemosiderin deposition is seen throughout the brain related to the old small vessel infarctions. No pituitary mass. There is chronic appearing opacification of the right maxillary sinus. Major vessels at the base of the brain show flow.  MRA HEAD FINDINGS  Both internal carotid arteries are patent into the brain. 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 This pseudoaneurysm measures about 6 mm in size. There appear to be multiple abnormal vessels at the skull base,  many intra osseous. I think this may he represent an AV fistula with intra osseous components. This is probably incidental and it would not appear to relate to the clinical presentation.  The anterior and middle cerebral vessels are patent without proximal stenosis, aneurysm or vascular malformation.  Both vertebral arteries are patent with the right being dominant. No basilar stenosis. Posterior circulation branch vessels appear normal. There is technical signal loss of the distal vertebral artery regions.  IMPRESSION: No acute brain finding. No acute infarction to explain the presenting symptoms.  Extensive chronic small vessel ischemic changes throughout the brain as outlined above.  No major vessel occlusion or correctable proximal stenosis.  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 This would not likely relate to the presenting symptoms.   Electronically Signed   By: Paulina FusiMark  Shogry M.D.   On: 11/16/2013 20:07    Microbiology: Recent Results (from the past 240 hour(s))  MRSA PCR SCREENING     Status: None   Collection Time    11/17/13 10:38 AM      Result Value Ref Range Status   MRSA by PCR NEGATIVE  NEGATIVE Final   Comment:            The GeneXpert MRSA Assay (FDA     approved for NASAL specimens     only), is one component of a     comprehensive MRSA colonization     surveillance program. It is not     intended to diagnose MRSA     infection nor to guide or     monitor treatment for     MRSA infections.     Labs: Basic Metabolic Panel:  Recent Labs Lab 11/16/13 0930  NA 146  K 3.5*  CL 106  CO2 26  GLUCOSE 110*  BUN 16  CREATININE 0.52  CALCIUM 8.7   Liver Function Tests: No results found for this basename: AST, ALT, ALKPHOS, BILITOT, PROT, ALBUMIN,  in the last 168 hours No results found for this basename: LIPASE, AMYLASE,  in the last 168 hours No results found for this  basename: AMMONIA,  in the last 168 hours CBC:  Recent Labs Lab 11/16/13 0930  WBC 8.3  NEUTROABS 6.3  HGB 13.7  HCT 40.7  MCV 96.2  PLT 193   Cardiac Enzymes:  Recent Labs Lab 11/16/13 0930  TROPONINI <0.30   BNP: BNP (last 3 results)  Recent Labs  05/09/13 1328  PROBNP 2605.0*   CBG:  Recent Labs Lab 11/17/13 1119 11/17/13 1635 11/17/13 2043 11/18/13 0740 11/18/13 1156  GLUCAP 114* 105* 101* 103*  85       Signed:  Tywanda Rice  [facility] 11/21/2013, 3:02 PM

## 2013-12-05 ENCOUNTER — Telehealth: Payer: Self-pay | Admitting: Cardiovascular Disease

## 2013-12-05 NOTE — Telephone Encounter (Signed)
New message     Daughter calling regarding new prescription to given to her mother while at hospital  .

## 2013-12-05 NOTE — Telephone Encounter (Signed)
Spoke with patient's daughter, [name], who is with patient and states pt was discharged on 10/9 and is about to run out of medications that were [facility].  I reviewed patient's chart and advised patient was supposed to have 1 week f/u with PCP.  Daughter states she was never told to make an appointment with anyone.  States patient's Metoprolol was increased to 50 mg twice daily and that patient was told to take Lasix when her legs swell.  Daughter states patient was not given a potassium supplement (pt's last K+ level was 3.5).  Daughter would also like to know if patient is to continue on ASA 325 mg - states she is about to run out.  Due to patient's age, history, and no assigned f/u, I scheduled patient to see Dr. Elease HashimotoNahser on this Wednesday.  Patient and daughter verbalized understanding and agreement.

## 2013-12-07 ENCOUNTER — Ambulatory Visit (INDEPENDENT_AMBULATORY_CARE_PROVIDER_SITE_OTHER): Payer: Federal, State, Local not specified - PPO | Admitting: Cardiovascular Disease

## 2013-12-07 ENCOUNTER — Encounter: Payer: Self-pay | Admitting: Cardiovascular Disease

## 2013-12-07 VITALS — BP 134/72 | HR 72 | Ht 63.0 in | Wt 119.0 lb

## 2013-12-07 DIAGNOSIS — I482 Chronic atrial fibrillation, unspecified: Secondary | ICD-10-CM

## 2013-12-07 DIAGNOSIS — I10 Essential (primary) hypertension: Secondary | ICD-10-CM

## 2013-12-07 DIAGNOSIS — M7989 Other specified soft tissue disorders: Secondary | ICD-10-CM

## 2013-12-07 MED ORDER — METOPROLOL TARTRATE 50 MG PO TABS: 50.0000 mg | ORAL_TABLET | Freq: Two times a day (BID) | ORAL | Status: DC

## 2013-12-07 NOTE — Addendum Note (Signed)
Addended by: Awilda BillARDEN, Reather Steller J on: 12/07/2013 12:48 PM   Modules accepted: Orders

## 2013-12-07 NOTE — Progress Notes (Addendum)
[name] Date of Birth  [birth year]       [facility]    [place] [facility] [address]  [address], [address] [place]  [address]   [place]  [id number] [phone]     [phone]   Fax  [phone]    Fax [phone]  Problem List: 1. Orthostatic Hypotension  - BP meds adjusted: HCTZ dc'd, Lopressor decreased during hospitalization April , 2013 2. Dysuria  - Urinalysis w/ (+) leukocytes, nitrite negative; urine culture pending  - Macrobid initiated on 5/1  Secondary Discharge Diagnoses:  1. Atrial Fibrillation  2. Hypertension  3. Mitral Valve Prolapse  4. H/o Rectal Cancer  5. Osteoporosis  6. Right Femoral neck fracture 2010  7. Left Wrist fracture   History of Present Illness:  She's done very well since she left the hospital. She was admitted in April with episodes of orthostatic hypotension. We made some medication adjustments including stopping her HCTZ and we decreased her Toprol dose.  We have had to cut her blood pressure medicines such that she has l high readings especially in the morning. This is because her drops her blood pressure dropped so quickly when she stands up.    She continues to be unsteady when she walks. She occasionally will walk with a cane or walker. We have encouraged her to use her cane and walker every time she goes out to walk.  May 25, 2012:  Ms. Laural Berry is doing well from a cardiac standpoint.  She has had lots of UTIs but these are better.    She has significant orthostasis.  Her BP is usually pretty good in the mornings - 140-150.    April 18, 2013:  [name] was admitted to the hospital recently because of syncope.  Sept. 9, 2015:  [name] is doing well. No further episodes of syncope.   Fairly steady with her walker.  No CP, breathing is ok.  She has had a-fib.  Takes Cartia which helped.  Dec 07, 2013  [name] was recently seen in the hospital for a TIA.   She presented with  weakness on her left side.    She has a history of atrial fibrillation but is not on any coagulation because of her history of falling.  She is almost back to normal.   Still has some foot weakness.   No speech issues.  Does have some short term memory issues according to daughter.    Current Outpatient Prescriptions on File Prior to Visit  Medication Sig Dispense Refill  . acetaminophen (TYLENOL) 500 MG tablet Take 500-1,000 mg by mouth daily as needed for moderate pain (pain).       Marland Kitchen. aspirin 325 MG tablet Take 1 tablet (325 mg total) by mouth daily.  1 tablet  0  . CARTIA XT 180 MG 24 hr capsule TAKE 1 CAPSULE BY MOUTH EVERY DAY  30 capsule  4  . Cranberry (SM CRANBERRY) 300 MG tablet Take 300 mg by mouth daily.      . furosemide (LASIX) 20 MG tablet Take 1 tablet (20 mg total) by mouth as needed (take as needed for incraesed leg swelling, do not use more than once a day).  30 tablet  0  . lactose free nutrition (BOOST PLUS) LIQD Take 237 mLs by mouth daily at 2 PM daily at 2 PM.    0  . metoprolol tartrate (LOPRESSOR) 50 MG tablet Take 1 tablet (50  mg total) by mouth 2 (two) times daily.  60 tablet  0  . Multiple Vitamins-Minerals (MULTIVITAMIN GUMMIES WOMENS) CHEW Chew 1 each by mouth daily.       . Naphazoline HCl (CLEAR EYES OP) Place 1 drop into both eyes at bedtime as needed (dry eyes/ itching).      . vitamin C (ASCORBIC ACID) 500 MG tablet Take 500 mg by mouth daily as needed (for immune support).       No current facility-administered medications on file prior to visit.    Allergies  Allergen Reactions  . Sulfa Drugs Cross Reactors Hives and Other (See Comments)    Almost passed out  . Irbesartan Other (See Comments)    Caused heart to race    Past Medical History  Diagnosis Date  . Hypertension   . MVP (mitral valve prolapse)     Not mentioned on 08/2005 echo however  . OP (osteoporosis)   . History of rectal cancer   . Fracture of femoral neck, right 2010  . Wrist  fracture     left  . Dizziness   . Blood transfusion     "my own blood"  . Headache(784.0)     "terrible while going thru menopause; none since then"  . Arthritis   . Anxiety     "now & then; I get over it"; denies RX  . Syncope 06/10/11    "fell; I've been having these spells; off & on; last time was maybe 2 yr ago"  . Atrial fibrillation     08/2005, normal EF at that time  . Tachyarrhythmia 06/11/11    burst of ST  . Pneumonia 05/2013    "once"    Past Surgical History  Procedure Laterality Date  . Transthoracic echocardiogram  08/20/2005    EF 60%  . Colonoscopy    . Total hip arthroplasty      bilaterally  . Inguinal hernia repair      right  . Breast cyst excision      right  . Cataract extraction w/ intraocular lens  implant, bilateral    . Colectomy      History  Smoking status  . Never Smoker   Smokeless tobacco  . Never Used    History  Alcohol Use No    Family History  Problem Relation Age of Onset  . Heart disease Neg Hx     Reviw of Systems:  Reviewed in the HPI.  All other systems are negative.  Physical Exam: Blood pressure 134/72, pulse 72, height 5\' 3"  (1.6 m), weight 119 lb (53.978 kg). General: Well developed, well nourished, in no acute distress.  Looks quite good for her age  Head: Normocephalic, atraumatic,  mucus membranes are moist,   Neck: Supple. Carotids are 2 + without bruits. No JVD  Lungs: Clear bilaterally to auscultation.  Heart: irregularly irregular.  normal  S1 S2. No murmurs, gallops or rubs.  Abdomen: Soft, non-tender, non-distended with normal bowel sounds. No hepatomegaly. No rebound/guarding. No masses.  Msk:  Strength and tone are normal  Extremities: No clubbing or cyanosis. 1+ right leg edema, trace left leg edema.  .  Distal pedal pulses are 2+ and equal bilaterally.  Neuro: Alert and oriented X 3. Moves all extremities spontaneously.  Psych:  Responds to questions appropriately with a normal  affect.  ECG: 11/29/2013: Atrial fibrillation with a competing junctional rhythm. Heart rate is 72. Assessment / Plan:

## 2013-12-07 NOTE — Patient Instructions (Addendum)
Get some salt substitute. Various manufacturers produces. [name]  makes a salt substitute-   potassium chloride.  Your physician recommends that you return for lab work in: 3 months on the same day as your office visit with Dr. Elease HashimotoNahser - basic metabolic panel  Your physician recommends that you schedule a follow-up appointment in: 3 months with Dr. Elease HashimotoNahser.

## 2013-12-07 NOTE — Assessment & Plan Note (Signed)
Amber MouldingRuth has chronic Afib. Marland Kitchen.  She has had a TIa. Unfortunately, she is unsteady and is at risk for falling and we have avoided anticoagulants.   She has improved significantly   Will continue current meds  I;ll see her in 3 months.

## 2013-12-07 NOTE — Assessment & Plan Note (Signed)
She has mild fight leg edema and takes lasix on an as needed basis.   Her potassium is on the lower side of normal i've encouraged her to use KCL and eat bananas to increase her potassium  Will recheck in 3 months with office visit.

## 2014-02-21 ENCOUNTER — Encounter: Payer: Self-pay | Admitting: Cardiovascular Disease

## 2014-03-02 ENCOUNTER — Other Ambulatory Visit: Payer: Medicare Other

## 2014-03-02 ENCOUNTER — Ambulatory Visit: Payer: Medicare Other | Admitting: Cardiovascular Disease

## 2014-03-21 ENCOUNTER — Other Ambulatory Visit (INDEPENDENT_AMBULATORY_CARE_PROVIDER_SITE_OTHER): Payer: Federal, State, Local not specified - PPO | Admitting: *Deleted

## 2014-03-21 ENCOUNTER — Encounter: Payer: Self-pay | Admitting: Cardiovascular Disease

## 2014-03-21 ENCOUNTER — Ambulatory Visit (INDEPENDENT_AMBULATORY_CARE_PROVIDER_SITE_OTHER): Payer: Federal, State, Local not specified - PPO | Admitting: Cardiovascular Disease

## 2014-03-21 VITALS — BP 172/90 | HR 74 | Ht 63.0 in | Wt 124.2 lb

## 2014-03-21 DIAGNOSIS — I482 Chronic atrial fibrillation, unspecified: Secondary | ICD-10-CM

## 2014-03-21 DIAGNOSIS — E785 Hyperlipidemia, unspecified: Secondary | ICD-10-CM

## 2014-03-21 DIAGNOSIS — R42 Dizziness and giddiness: Secondary | ICD-10-CM | POA: Insufficient documentation

## 2014-03-21 DIAGNOSIS — M7989 Other specified soft tissue disorders: Secondary | ICD-10-CM

## 2014-03-21 DIAGNOSIS — I10 Essential (primary) hypertension: Secondary | ICD-10-CM

## 2014-03-21 LAB — BASIC METABOLIC PANEL
BUN: 23 mg/dL (ref 6–23)
CHLORIDE: 108 meq/L (ref 96–112)
CO2: 32 mEq/L (ref 19–32)
Calcium: 9.1 mg/dL (ref 8.4–10.5)
Creatinine, Ser: 0.61 mg/dL (ref 0.40–1.20)
GFR: 96.25 mL/min (ref >60.00)
Glucose, Bld: 92 mg/dL (ref 70–99)
POTASSIUM: 4.1 meq/L (ref 3.5–5.1)
Sodium: 143 mEq/L (ref 135–145)

## 2014-03-21 NOTE — Patient Instructions (Signed)
Your physician recommends that you continue on your current medications as directed. Please refer to the Current Medication list given to you today.  Your physician wants you to follow-up in: 6 months with Dr. Nahser.  You will receive a reminder letter in the mail two months in advance. If you don't receive a letter, please call our office to schedule the follow-up appointment.  

## 2014-03-21 NOTE — Progress Notes (Signed)
Cardiology Office Note   Date:  03/21/2014   ID:  Amber PressmanRuth T Berry, DOB 1914/04/23, MRN [id number]  PCP:  Egbert GaribaldiMillsaps, Amber M, NP  Cardiologist:   Vesta MixerNahser, Amber Acero J, MD   No chief complaint on file.  Problem List: 1. Orthostatic Hypotension  - BP meds adjusted: HCTZ dc'd, Lopressor decreased during hospitalization April , 2013 2. Dysuria  - Urinalysis w/ (+) leukocytes, nitrite negative; urine culture pending  - Macrobid initiated on 5/1  Secondary Discharge Diagnoses:  1. Atrial Fibrillation  2. Hypertension  3. Mitral Valve Prolapse  4. H/o Rectal Cancer  5. Osteoporosis  6. Right Femoral neck fracture 2010  7. Left Wrist fracture   History of Present Illness:  She's done very well since she left the hospital. She was admitted in April with episodes of orthostatic hypotension. We made some medication adjustments including stopping her HCTZ and we decreased her Toprol dose. We have had to cut her blood pressure medicines such that she has l high readings especially in the morning. This is because her drops her blood pressure dropped so quickly when she stands up.   She continues to be unsteady when she walks. She occasionally will walk with a cane or walker. We have encouraged her to use her cane and walker every time she goes out to walk.  May 25, 2012:  [name] is doing well from a cardiac standpoint. She has had lots of UTIs but these are better.   She has significant orthostasis. Her BP is usually pretty good in the mornings - 140-150.   April 18, 2013:  [name] was admitted to the hospital recently because of syncope.  Sept. 9, 2015:  [name] is doing well. No further episodes of syncope. Fairly steady with her walker.  No CP, breathing is ok.  She has had a-fib. Takes Cartia which helped.  Dec 07, 2013  [name] was recently seen in the hospital for a TIA. She presented with weakness on her left side. She has a history of atrial  fibrillation but is not on any coagulation because of her history of falling.  She is almost back to normal. Still has some foot weakness.  No speech issues. Does have some short term memory issues according to daughter.    Feb. 9, 2016:  [name] is seen today for follow up visit.  Has vertigo symptoms - especially when she turns her head to the left.   Has moved in with daughter.       Past Medical History  Diagnosis Date  . Hypertension   . MVP (mitral valve prolapse)     Not mentioned on 08/2005 echo however  . OP (osteoporosis)   . History of rectal cancer   . Fracture of femoral neck, right 2010  . Wrist fracture     left  . Dizziness   . Blood transfusion     "my own blood"  . Headache(784.0)     "terrible while going thru menopause; none since then"  . Arthritis   . Anxiety     "now & then; I get over it"; denies RX  . Syncope 06/10/11    "fell; I've been having these spells; off & on; last time was maybe 2 yr ago"  . Atrial fibrillation     08/2005, normal EF at that time  . Tachyarrhythmia 06/11/11    burst of ST  . Pneumonia 05/2013    "once"    Past Surgical History  Procedure Laterality  Date  . Transthoracic echocardiogram  08/20/2005    EF 60%  . Colonoscopy    . Total hip arthroplasty      bilaterally  . Inguinal hernia repair      right  . Breast cyst excision      right  . Cataract extraction w/ intraocular lens  implant, bilateral    . Colectomy       Current Outpatient Prescriptions  Medication Sig Dispense Refill  . acetaminophen (TYLENOL) 500 MG tablet Take 500-1,000 mg by mouth daily as needed for moderate pain (pain).     Marland Kitchen aspirin 325 MG tablet Take 1 tablet (325 mg total) by mouth daily. 1 tablet 0  . CARTIA XT 180 MG 24 hr capsule TAKE 1 CAPSULE BY MOUTH EVERY DAY 30 capsule 4  . Cranberry (SM CRANBERRY) 300 MG tablet Take 300 mg by mouth daily.    . furosemide (LASIX) 20 MG tablet Take 1 tablet (20 mg total) by mouth as needed  (take as needed for incraesed leg swelling, do not use more than once a day). 30 tablet 0  . lactose free nutrition (BOOST PLUS) LIQD Take 237 mLs by mouth daily at 2 PM daily at 2 PM.  0  . metoprolol (LOPRESSOR) 50 MG tablet Take 1 tablet (50 mg total) by mouth 2 (two) times daily. 180 tablet 3  . Multiple Vitamins-Minerals (MULTIVITAMIN GUMMIES WOMENS) CHEW Chew 1 each by mouth daily.     . Naphazoline HCl (CLEAR EYES OP) Place 1 drop into both eyes at bedtime as needed (dry eyes/ itching).    . vitamin C (ASCORBIC ACID) 500 MG tablet Take 500 mg by mouth daily as needed (for immune support).     No current facility-administered medications for this visit.    Allergies:   Sulfa drugs cross reactors and Irbesartan    Social History:  The patient  reports that she has never smoked. She has never used smokeless tobacco. She reports that she does not drink alcohol or use illicit drugs.   Family History:  The patient's family history is negative for Heart disease.    ROS:  Please see the history of present illness.    Review of Systems: Constitutional:  denies fever, chills, diaphoresis, appetite change and fatigue.  HEENT: denies photophobia, eye pain, redness, hearing loss, ear pain, congestion, sore throat, rhinorrhea, sneezing, neck pain, neck stiffness and tinnitus.  Respiratory: denies SOB, DOE, cough, chest tightness, and wheezing.  Cardiovascular: denies chest pain, palpitations and leg swelling.  Gastrointestinal: denies nausea, vomiting, abdominal pain, diarrhea, constipation, blood in stool.  Genitourinary: denies dysuria, urgency, frequency, hematuria, flank pain and difficulty urinating.  Musculoskeletal: denies  myalgias, back pain, joint swelling, arthralgias and gait problem.   Skin: denies pallor, rash and wound.  Neurological: admits to dizziness,  Vertigo problems   Hematological: denies adenopathy, easy bruising, personal or family bleeding history.   Psychiatric/ Behavioral: denies suicidal ideation, mood changes, confusion, nervousness, sleep disturbance and agitation.       All other systems are reviewed and negative.    PHYSICAL EXAM: VS:  BP 172/90 mmHg  Pulse 74  Ht  (1.6 Berry)  Wt 124 lb 3.2 oz (56.337 kg)  BMI 22.01 kg/m2  SpO2 98% , BMI Body mass index is 22.01 kg/(Berry^2). GEN: Well nourished, well developed, in no acute distress,  HEENT: normal Neck: no JVD, carotid bruits, or masses Cardiac:  Irregularly Irregular ;  Soft systolic murmur.  no edema  Respiratory:  clear to auscultation bilaterally, normal work of breathing GI: soft, nontender, nondistended, + BS MS: no deformity or atrophy Skin: warm and dry, no rash Neuro:  Strength and sensation are intact Psych: normal   EKG:  EKG is not ordered today.    Recent Labs: 04/05/2013: TSH 1.470 05/09/2013: Pro B Natriuretic peptide (BNP) 2605.0* 11/16/2013: BUN 16; Creatinine 0.52; Hemoglobin 13.7; Platelets 193; Potassium 3.5*; Sodium 146    Lipid Panel    Component Value Date/Time   CHOL 126 11/17/2013 0329   TRIG 81 11/17/2013 0329   HDL 56 11/17/2013 0329   CHOLHDL 2.3 11/17/2013 0329   VLDL 16 11/17/2013 0329   LDLCALC 54 11/17/2013 0329      Wt Readings from Last 3 Encounters:  03/21/14 124 lb 3.2 oz (56.337 kg)  12/07/13 119 lb (53.978 kg)  11/18/13 115 lb (52.164 kg)      Other studies Reviewed: Additional studies/ records that were reviewed today include: . Review of the above records demonstrates:    ASSESSMENT AND PLAN:  1. Orthostatic Hypotension  She is dong well.  No severe episodes of orthostatic hypotension.  We have continued to run her BP a bit on the high side to avoid orthostatic episodes.   2. Atrial Fibrillation - chronic atrial fib . , not on anticoagulation because of risk of falling .   3. Hypertension -  BP has been stable, a bit elevated but this is by design.  She has had lots of problems with orthostatic  hypotension.  4. Mitral Valve Prolapse  5. H/o Rectal Cancer  5. Osteoporosis  7. Right Femoral neck fracture 2010  8. Left Wrist fracture   Current medicines are reviewed at length with the patient today.  The patient does not have concerns regarding medicines.  The following changes have been made:  no change   Disposition:   FU with me in 6 months .     Signed, Dylen Mcelhannon, Deloris Ping, MD  03/21/2014 11:35 AM    [facility] [address] [place], [place], [place]  [address] Phone: [phone]; Fax: [phone]

## 2014-03-23 ENCOUNTER — Other Ambulatory Visit: Payer: Self-pay | Admitting: Nurse Practitioner

## 2014-03-23 MED ORDER — DILTIAZEM HCL ER COATED BEADS 180 MG PO CP24: 180.0000 mg | ORAL_CAPSULE | Freq: Every day | ORAL | Status: DC

## 2014-04-22 ENCOUNTER — Other Ambulatory Visit: Payer: Self-pay | Admitting: Cardiovascular Disease

## 2014-04-28 ENCOUNTER — Other Ambulatory Visit: Payer: Self-pay

## 2014-04-28 MED ORDER — DILTIAZEM HCL ER COATED BEADS 180 MG PO CP24: 180.0000 mg | ORAL_CAPSULE | Freq: Every day | ORAL | Status: DC

## 2014-09-18 ENCOUNTER — Encounter: Payer: Self-pay | Admitting: *Deleted

## 2014-09-29 ENCOUNTER — Encounter: Payer: Self-pay | Admitting: Cardiovascular Disease

## 2014-09-29 ENCOUNTER — Ambulatory Visit (INDEPENDENT_AMBULATORY_CARE_PROVIDER_SITE_OTHER): Payer: Federal, State, Local not specified - PPO | Admitting: Cardiovascular Disease

## 2014-09-29 VITALS — BP 170/80 | HR 79 | Ht 63.0 in | Wt 121.6 lb

## 2014-09-29 DIAGNOSIS — I4891 Unspecified atrial fibrillation: Secondary | ICD-10-CM | POA: Diagnosis not present

## 2014-09-29 DIAGNOSIS — I10 Essential (primary) hypertension: Secondary | ICD-10-CM | POA: Diagnosis not present

## 2014-09-29 NOTE — Patient Instructions (Signed)
Medication Instructions:  Your physician recommends that you continue on your current medications as directed. Please refer to the Current Medication list given to you today.   Labwork: None Ordered   Testing/Procedures: None Ordered   Follow-Up: Your physician wants you to follow-up in: 1 year with Dr. Nahser.  You will receive a reminder letter in the mail two months in advance. If you don't receive a letter, please call our office to schedule the follow-up appointment.      

## 2014-09-29 NOTE — Progress Notes (Signed)
Cardiology Office Note   Date:  09/29/2014   ID:  [name], DOB Apr 27, 1914, MRN [id number]  PCP:  Amber Garibaldi, NP  Cardiologist:   Amber Mixer, MD   Chief Complaint  Patient presents with  . Atrial Fibrillation   Problem List: 1. Orthostatic Hypotension  - BP meds adjusted: HCTZ dc'd, Lopressor decreased during hospitalization April , 2013 2. Dysuria  - Urinalysis w/ (+) leukocytes, nitrite negative; urine culture pending  - Macrobid initiated on 5/1  Secondary Discharge Diagnoses:  1. Atrial Fibrillation  2. Hypertension  3. Mitral Valve Prolapse  4. H/o Rectal Cancer  5. Osteoporosis  6. Right Femoral neck fracture 2010  7. Left Wrist fracture   History of Present Illness:  She's done very well since she left the hospital. She was admitted in April with episodes of orthostatic hypotension. We made some medication adjustments including stopping her HCTZ and we decreased her Toprol dose. We have had to cut her blood pressure medicines such that she has l high readings especially in the morning. This is because her drops her blood pressure dropped so quickly when she stands up.   She continues to be unsteady when she walks. She occasionally will walk with a cane or walker. We have encouraged her to use her cane and walker every time she goes out to walk.  May 25, 2012:  [name] is doing well from a cardiac standpoint. She has had lots of UTIs but these are better.   She has significant orthostasis. Her BP is usually pretty good in the mornings - 140-150.   April 18, 2013:  [name] was admitted to the hospital recently because of syncope.  Sept. 9, 2015:  [name] is doing well. No further episodes of syncope. Fairly steady with her walker.  No CP, breathing is ok.  She has had a-fib. Takes Cartia which helped.  Dec 07, 2013  [name] was recently seen in the hospital for a TIA. She presented with weakness on her left side.  She has a history of atrial fibrillation but is not on any coagulation because of her history of falling.  She is almost back to normal. Still has some foot weakness.  No speech issues. Does have some short term memory issues according to daughter.    Feb. 9, 2016:  [name] is seen today for follow up visit.  Has vertigo symptoms - especially when she turns her head to the left.   Has moved in with daughter.   09/29/2014:      Past Medical History  Diagnosis Date  . Hypertension   . MVP (mitral valve prolapse)     Not mentioned on 08/2005 echo however  . OP (osteoporosis)   . History of rectal cancer   . Fracture of femoral neck, right 2010  . Wrist fracture     left  . Dizziness   . Blood transfusion     "my own blood"  . Headache(784.0)     "terrible while going thru menopause; none since then"  . Arthritis   . Anxiety     "now & then; I get over it"; denies RX  . Syncope 06/10/11    "fell; I've been having these spells; off & on; last time was maybe 2 yr ago"  . Atrial fibrillation     08/2005, normal EF at that time  . Tachyarrhythmia 06/11/11    burst of ST  . Pneumonia 05/2013    "once"  Past Surgical History  Procedure Laterality Date  . Transthoracic echocardiogram  08/20/2005    EF 60%  . Colonoscopy    . Total hip arthroplasty      bilaterally  . Inguinal hernia repair      right  . Breast cyst excision      right  . Cataract extraction w/ intraocular lens  implant, bilateral    . Colectomy       Current Outpatient Prescriptions  Medication Sig Dispense Refill  . acetaminophen (TYLENOL) 500 MG tablet Take 500-1,000 mg by mouth daily as needed for moderate pain (pain).     Marland Kitchen aspirin 325 MG tablet Take 1 tablet (325 mg total) by mouth daily. 1 tablet 0  . Cranberry (SM CRANBERRY) 300 MG tablet Take 300 mg by mouth daily.    Marland Kitchen diltiazem (CARTIA XT) 180 MG 24 hr capsule Take 1 capsule (180 mg total) by mouth daily. 91 capsule 3  . furosemide  (LASIX) 20 MG tablet Take 1 tablet (20 mg total) by mouth as needed (take as needed for incraesed leg swelling, do not use more than once a day). 30 tablet 0  . lactose free nutrition (BOOST PLUS) LIQD Take 237 mLs by mouth daily at 2 PM daily at 2 PM.  0  . metoprolol (LOPRESSOR) 50 MG tablet Take 1 tablet (50 mg total) by mouth 2 (two) times daily. 180 tablet 3  . Multiple Vitamins-Minerals (MULTIVITAMIN GUMMIES WOMENS) CHEW Chew 1 each by mouth daily.     . Naphazoline HCl (CLEAR EYES OP) Place 1 drop into both eyes at bedtime as needed (dry eyes/ itching).    . vitamin C (ASCORBIC ACID) 500 MG tablet Take 500 mg by mouth daily as needed (for immune support).     No current facility-administered medications for this visit.    Allergies:   Sulfa drugs cross reactors and Irbesartan    Social History:  The patient  reports that she has never smoked. She has never used smokeless tobacco. She reports that she does not drink alcohol or use illicit drugs.   Family History:  The patient's family history is negative for Heart disease.    ROS:  Please see the history of present illness.    Review of Systems: Constitutional:  denies fever, chills, diaphoresis, appetite change and fatigue.  HEENT: denies photophobia, eye pain, redness, hearing loss, ear pain, congestion, sore throat, rhinorrhea, sneezing, neck pain, neck stiffness and tinnitus.  Respiratory: denies SOB, DOE, cough, chest tightness, and wheezing.  Cardiovascular: denies chest pain, palpitations and leg swelling.  Gastrointestinal: denies nausea, vomiting, abdominal pain, diarrhea, constipation, blood in stool.  Genitourinary: denies dysuria, urgency, frequency, hematuria, flank pain and difficulty urinating.  Musculoskeletal: denies  myalgias, back pain, joint swelling, arthralgias and gait problem.   Skin: denies pallor, rash and wound.  Neurological: admits to dizziness,  Vertigo problems   Hematological: denies adenopathy,  easy bruising, personal or family bleeding history.  Psychiatric/ Behavioral: denies suicidal ideation, mood changes, confusion, nervousness, sleep disturbance and agitation.       All other systems are reviewed and negative.    PHYSICAL EXAM: VS:  BP 170/80 mmHg  Pulse 79  Ht 5\' 3"  (1.6 m)  Wt 55.157 kg (121 lb 9.6 oz)  BMI 21.55 kg/m2 , BMI Body mass index is 21.55 kg/(m^2). GEN: Well nourished, well developed, in no acute distress,  HEENT: normal Neck: no JVD, carotid bruits, or masses Cardiac:  Irregularly Irregular ;  Soft  systolic murmur.  no edema  Respiratory:  clear to auscultation bilaterally, normal work of breathing GI: soft, nontender, nondistended, + BS MS: no deformity or atrophy Skin: warm and dry, no rash Neuro:  Strength and sensation are intact Psych: normal   EKG:  EKG is not ordered today.    Recent Labs: 11/16/2013: Hemoglobin 13.7; Platelets 193 03/21/2014: BUN 23; Creatinine, Ser 0.61; Potassium 4.1; Sodium 143    Lipid Panel    Component Value Date/Time   CHOL 126 11/17/2013 0329   TRIG 81 11/17/2013 0329   HDL 56 11/17/2013 0329   CHOLHDL 2.3 11/17/2013 0329   VLDL 16 11/17/2013 0329   LDLCALC 54 11/17/2013 0329      Wt Readings from Last 3 Encounters:  09/29/14 55.157 kg (121 lb 9.6 oz)  03/21/14 56.337 kg (124 lb 3.2 oz)  12/07/13 53.978 kg (119 lb)      Other studies Reviewed: Additional studies/ records that were reviewed today include: . Review of the above records demonstrates:    ASSESSMENT AND PLAN:  1. Orthostatic Hypotension  She is dong well.  No severe episodes of orthostatic hypotension.  We have continued to run her BP a bit on the high side to avoid orthostatic episodes.   2. Atrial Fibrillation - chronic atrial fib . , not on anticoagulation because of risk of falling . Continue same medications. She seems to be very stable.  3. Hypertension -  BP has been stable, a bit elevated but this is by design.  She  has had lots of problems with orthostatic hypotension.  4. Mitral Valve Prolapse  5. H/o Rectal Cancer  5. Osteoporosis  7. Right Femoral neck fracture 2010  8. Left Wrist fracture   Current medicines are reviewed at length with the patient today.  The patient does not have concerns regarding medicines.  The following changes have been made:  no change   Disposition:   FU with me in 1 year      Signed, Krisinda Giovanni, Deloris Ping, MD  09/29/2014 3:24 PM    [facility] [address] [place], [place], [place]  [address] Phone: [phone]; Fax: [phone]

## 2014-12-25 ENCOUNTER — Other Ambulatory Visit: Payer: Self-pay

## 2014-12-25 MED ORDER — METOPROLOL TARTRATE 50 MG PO TABS: 50.0000 mg | ORAL_TABLET | Freq: Two times a day (BID) | ORAL | Status: DC

## 2015-03-19 ENCOUNTER — Telehealth: Payer: Self-pay | Admitting: Cardiovascular Disease

## 2015-03-19 NOTE — Telephone Encounter (Signed)
  Pt daughter called in stating that the pt will be having a dentist appt in March and she would like to know if there are any precautions that need to be taken when she goes to her appt.         In review of the chart  I do not see anything that would indicate a need to be on an antibiotic prior to her dental cleaning besides have a hip prosthesis in 2007

## 2015-03-19 NOTE — Telephone Encounter (Signed)
Pt daughter called in stating that the pt will be having a dentist appt in March and she would like to know if there are any precautions that need to be taken when she goes to her appt.

## 2015-03-20 NOTE — Telephone Encounter (Signed)
Spoke with patient's daughter, Jenel Lucks, and reviewed Dr. Harvie Bridge advice that no antibiotic is needed for cardiac reason.  She verbalized understanding and thanked me for the call.

## 2015-03-20 NOTE — Telephone Encounter (Signed)
She does not need any antibiotic from a cardiac standpoint.

## 2015-03-26 ENCOUNTER — Other Ambulatory Visit: Payer: Self-pay | Admitting: Cardiovascular Disease

## 2015-03-26 MED ORDER — DILTIAZEM HCL ER COATED BEADS 180 MG PO CP24: 180.0000 mg | ORAL_CAPSULE | Freq: Every day | ORAL | Status: DC

## 2015-03-28 ENCOUNTER — Other Ambulatory Visit: Payer: Self-pay

## 2015-03-28 MED ORDER — DILTIAZEM HCL ER COATED BEADS 180 MG PO CP24: 180.0000 mg | ORAL_CAPSULE | Freq: Every day | ORAL | Status: DC

## 2015-09-27 ENCOUNTER — Encounter: Payer: Self-pay | Admitting: Cardiovascular Disease

## 2015-10-11 ENCOUNTER — Encounter: Payer: Self-pay | Admitting: Cardiovascular Disease

## 2015-10-11 ENCOUNTER — Encounter (INDEPENDENT_AMBULATORY_CARE_PROVIDER_SITE_OTHER): Payer: Self-pay

## 2015-10-11 ENCOUNTER — Ambulatory Visit (INDEPENDENT_AMBULATORY_CARE_PROVIDER_SITE_OTHER): Payer: Federal, State, Local not specified - PPO | Admitting: Cardiovascular Disease

## 2015-10-11 VITALS — BP 140/82 | HR 72 | Ht 63.0 in | Wt 119.2 lb

## 2015-10-11 DIAGNOSIS — I482 Chronic atrial fibrillation, unspecified: Secondary | ICD-10-CM

## 2015-10-11 DIAGNOSIS — I10 Essential (primary) hypertension: Secondary | ICD-10-CM | POA: Diagnosis not present

## 2015-10-11 NOTE — Patient Instructions (Signed)
Medication Instructions:  Your physician recommends that you continue on your current medications as directed. Please refer to the Current Medication list given to you today.   Labwork: None Ordered   Testing/Procedures: None Ordered   Follow-Up: Your physician wants you to follow-up in: 1 year with Dr. Nahser.  You will receive a reminder letter in the mail two months in advance. If you don't receive a letter, please call our office to schedule the follow-up appointment.   If you need a refill on your cardiac medications before your next appointment, please call your pharmacy.   Thank you for choosing [facility]! Layloni Fahrner, RN [phone]    

## 2015-10-11 NOTE — Progress Notes (Signed)
Cardiology Office Note   Date:  10/11/2015   ID:  [name], DOB 05-04-1914, MRN [id number]  PCP:  Egbert Garibaldi, NP  Cardiologist:   Kristeen Miss, MD   Chief Complaint  Patient presents with  . Atrial Fibrillation     1. Atrial Fibrillation  2. Hypertension  3. Mitral Valve Prolapse  4. H/o Rectal Cancer  5. Osteoporosis  6. Right Femoral neck fracture 2010  7. Left Wrist fracture     She's done very well since she left the hospital. She was admitted in April with episodes of orthostatic hypotension. We made some medication adjustments including stopping her HCTZ and we decreased her Toprol dose. We have had to cut her blood pressure medicines such that she has l high readings especially in the morning. This is because her drops her blood pressure dropped so quickly when she stands up.   She continues to be unsteady when she walks. She occasionally will walk with a cane or walker. We have encouraged her to use her cane and walker every time she goes out to walk.  May 25, 2012:  [name] is doing well from a cardiac standpoint. She has had lots of UTIs but these are better.   She has significant orthostasis. Her BP is usually pretty good in the mornings - 140-150.   April 18, 2013:  [name] was admitted to the hospital recently because of syncope.  Sept. 9, 2015:  [name] is doing well. No further episodes of syncope. Fairly steady with her walker.  No CP, breathing is ok.  She has had a-fib. Takes Cartia which helped.  Dec 07, 2013  [name] was recently seen in the hospital for a TIA. She presented with weakness on her left side. She has a history of atrial fibrillation but is not on any coagulation because of her history of falling.  She is almost back to normal. Still has some foot weakness.  No speech issues. Does have some short term memory issues according to daughter.    Feb. 9, 2016:  [name] is seen today for follow up  visit.  Has vertigo symptoms - especially when she turns her head to the left.   Has moved in with daughter.   09/29/2014:   Aug. 31, 2017: Doing well No CP or dyspnea Some stomache issues     Past Medical History:  Diagnosis Date  . Anxiety    "now & then; I get over it"; denies RX  . Arthritis   . Atrial fibrillation (HCC)    08/2005, normal EF at that time  . Blood transfusion    "my own blood"  . Dizziness   . Fracture of femoral neck, right (HCC) 2010  . Headache(784.0)    "terrible while going thru menopause; none since then"  . History of rectal cancer   . Hypertension   . MVP (mitral valve prolapse)    Not mentioned on 08/2005 echo however  . OP (osteoporosis)   . Pneumonia 05/2013   "once"  . Syncope 06/10/11   "fell; I've been having these spells; off & on; last time was maybe 2 yr ago"  . Tachyarrhythmia 06/11/11   burst of ST  . Wrist fracture    left    Past Surgical History:  Procedure Laterality Date  . BREAST CYST EXCISION     right  . CATARACT EXTRACTION W/ INTRAOCULAR LENS  IMPLANT, BILATERAL    . COLECTOMY    . COLONOSCOPY    .  INGUINAL HERNIA REPAIR     right  . TOTAL HIP ARTHROPLASTY     bilaterally  . TRANSTHORACIC ECHOCARDIOGRAM  08/20/2005   EF 60%     Current Outpatient Prescriptions  Medication Sig Dispense Refill  . acetaminophen (TYLENOL) 500 MG tablet Take 500-1,000 mg by mouth daily as needed for moderate pain (pain).     Marland Kitchen aspirin 325 MG tablet Take 1 tablet (325 mg total) by mouth daily. 1 tablet 0  . Cranberry (SM CRANBERRY) 300 MG tablet Take 300 mg by mouth every other day.     . diltiazem (CARTIA XT) 180 MG 24 hr capsule Take 1 capsule (180 mg total) by mouth daily. 90 capsule 2  . furosemide (LASIX) 20 MG tablet Take 1 tablet (20 mg total) by mouth as needed (take as needed for incraesed leg swelling, do not use more than once a day). 30 tablet 0  . lactose free nutrition (BOOST PLUS) LIQD Take 237 mLs by mouth daily at 2  PM daily at 2 PM.  0  . metoprolol (LOPRESSOR) 50 MG tablet Take 1 tablet (50 mg total) by mouth 2 (two) times daily. 180 tablet 3  . Multiple Vitamins-Minerals (MULTIVITAMIN GUMMIES WOMENS) CHEW Chew 1 each by mouth daily.     . Naphazoline HCl (CLEAR EYES OP) Place 1 drop into both eyes at bedtime as needed (dry eyes/ itching).    . Pumpkin Seed-Soy Germ (AZO BLADDER CONTROL/GO-LESS PO) Take 1 capsule by mouth at bedtime.    . vitamin C (ASCORBIC ACID) 500 MG tablet Take 500 mg by mouth daily as needed (for immune support).     No current facility-administered medications for this visit.     Allergies:   Sulfa drugs cross reactors and Irbesartan    Social History:  The patient  reports that she has never smoked. She has never used smokeless tobacco. She reports that she does not drink alcohol or use drugs.   Family History:  The patient's family history is not on file.    ROS:  Please see the history of present illness.    Review of Systems: Constitutional:  denies fever, chills, diaphoresis, appetite change and fatigue.  HEENT: denies photophobia, eye pain, redness, hearing loss, ear pain, congestion, sore throat, rhinorrhea, sneezing, neck pain, neck stiffness and tinnitus.  Respiratory: denies SOB, DOE, cough, chest tightness, and wheezing.  Cardiovascular: denies chest pain, palpitations and leg swelling.  Gastrointestinal: denies nausea, vomiting, abdominal pain, diarrhea, constipation, blood in stool.  Genitourinary: denies dysuria, urgency, frequency, hematuria, flank pain and difficulty urinating.  Musculoskeletal: denies  myalgias, back pain, joint swelling, arthralgias and gait problem.   Skin: denies pallor, rash and wound.  Neurological: admits to dizziness,  Vertigo problems   Hematological: denies adenopathy, easy bruising, personal or family bleeding history.  Psychiatric/ Behavioral: denies suicidal ideation, mood changes, confusion, nervousness, sleep disturbance  and agitation.       All other systems are reviewed and negative.    PHYSICAL EXAM: VS:  BP 140/82   Pulse 72   Ht 5\' 3"  (1.6 m)   Wt 119 lb 3.2 oz (54.1 kg)   BMI 21.12 kg/m  , BMI Body mass index is 21.12 kg/m. GEN: Well nourished, well developed, in no acute distress ,  HEENT: normal  Neck: no JVD, carotid bruits, or masses Cardiac:  Irregularly Irregular ;  Soft systolic murmur.  no edema  Respiratory:  clear to auscultation bilaterally, normal work of breathing GI: soft, nontender,  nondistended, + BS MS: no deformity or atrophy  Skin: warm and dry, no rash Neuro:  Strength and sensation are intact Psych: normal  EKG:  EKG is ordered today. Atrial fib with rate of 72.   Recent Labs: No results found for requested labs within last 8760 hours.    Lipid Panel    Component Value Date/Time   CHOL 126 11/17/2013 0329   TRIG 81 11/17/2013 0329   HDL 56 11/17/2013 0329   CHOLHDL 2.3 11/17/2013 0329   VLDL 16 11/17/2013 0329   LDLCALC 54 11/17/2013 0329      Wt Readings from Last 3 Encounters:  10/11/15 119 lb 3.2 oz (54.1 kg)  09/29/14 121 lb 9.6 oz (55.2 kg)  03/21/14 124 lb 3.2 oz (56.3 kg)      Other studies Reviewed: Additional studies/ records that were reviewed today include: . Review of the above records demonstrates:    ASSESSMENT AND PLAN:  1. Orthostatic Hypotension  She is dong well.  No severe episodes of orthostatic hypotension.  We have continued to run her BP a bit on the high side to avoid orthostatic episodes.   2. Atrial Fibrillation - chronic atrial fib . , not on anticoagulation because of risk of falling .  Discussed this with patient and daughter.  Continue same medications. She seems to be very stable.  3. Hypertension -  BP has been stable, a 4. Mitral Valve Prolapse  5. H/o Rectal Cancer  5. Osteoporosis  7. Right Femoral neck fracture 2010  8. Left Wrist fracture    Current medicines are reviewed at length with the  patient today.  The patient does not have concerns regarding medicines.  The following changes have been made:  no change   Disposition:   FU with me in 1 year      Signed, Kristeen MissPhilip Ramiah Helfrich, MD  10/11/2015 10:11 AM    [facility] [address], [place], [place]  [address] Phone: [phone]) [phone]; Fax: [phone]

## 2015-12-22 ENCOUNTER — Other Ambulatory Visit: Payer: Self-pay | Admitting: Cardiovascular Disease

## 2015-12-31 ENCOUNTER — Encounter: Payer: Self-pay | Admitting: Cardiovascular Disease

## 2015-12-31 ENCOUNTER — Ambulatory Visit (INDEPENDENT_AMBULATORY_CARE_PROVIDER_SITE_OTHER): Payer: Federal, State, Local not specified - PPO | Admitting: Cardiovascular Disease

## 2015-12-31 ENCOUNTER — Telehealth: Payer: Self-pay | Admitting: Cardiovascular Disease

## 2015-12-31 VITALS — BP 166/90 | HR 64 | Ht 63.0 in

## 2015-12-31 DIAGNOSIS — I10 Essential (primary) hypertension: Secondary | ICD-10-CM | POA: Diagnosis not present

## 2015-12-31 MED ORDER — LOSARTAN POTASSIUM 50 MG PO TABS: 50.0000 mg | ORAL_TABLET | Freq: Every day | ORAL | 11 refills | Status: DC

## 2015-12-31 NOTE — Progress Notes (Signed)
Cardiology Office Note   Date:  12/31/2015   ID:  [name], DOB 07-17-1914, MRN [id number]  PCP:  Egbert Garibaldi, NP  Cardiologist:   Kristeen Miss, MD   Chief Complaint  Patient presents with  . Hypertension  . Atrial Fibrillation     1. Atrial Fibrillation  2. Hypertension  3. Mitral Valve Prolapse  4. H/o Rectal Cancer  5. Osteoporosis  6. Right Femoral neck fracture 2010  7. Left Wrist fracture   She's done very well since she left the hospital. She was admitted in April with episodes of orthostatic hypotension. We made some medication adjustments including stopping her HCTZ and we decreased her Toprol dose. We have had to cut her blood pressure medicines such that she has l high readings especially in the morning. This is because her drops her blood pressure dropped so quickly when she stands up.   She continues to be unsteady when she walks. She occasionally will walk with a cane or walker. We have encouraged her to use her cane and walker every time she goes out to walk.  May 25, 2012:  [name] is doing well from a cardiac standpoint. She has had lots of UTIs but these are better.   She has significant orthostasis. Her BP is usually pretty good in the mornings - 140-150.   April 18, 2013:  [name] was admitted to the hospital recently because of syncope.  Sept. 9, 2015:  [name] is doing well. No further episodes of syncope. Fairly steady with her walker.  No CP, breathing is ok.  She has had a-fib. Takes Cartia which helped.  Dec 07, 2013  [name] was recently seen in the hospital for a TIA. She presented with weakness on her left side. She has a history of atrial fibrillation but is not on any coagulation because of her history of falling.  She is almost back to normal. Still has some foot weakness.  No speech issues. Does have some short term memory issues according to daughter.    Feb. 9, 2016:  [name] is seen today  for follow up visit.  Has vertigo symptoms - especially when she turns her head to the left.   Has moved in with daughter.   09/29/2014:   Aug. 31, 2017: Doing well No CP or dyspnea Some stomache issues  Nov. 20, 2017:  [name] is seen today for evaluation of her HTN She's had episodes of hypertension for years but more recently has been having orthostatic hypotension.  Daughter has kept BP log  Has had a headache on occasion.    Past Medical History:  Diagnosis Date  . Anxiety    "now & then; I get over it"; denies RX  . Arthritis   . Atrial fibrillation (HCC)    08/2005, normal EF at that time  . Blood transfusion    "my own blood"  . Dizziness   . Fracture of femoral neck, right (HCC) 2010  . Headache(784.0)    "terrible while going thru menopause; none since then"  . History of rectal cancer   . Hypertension   . MVP (mitral valve prolapse)    Not mentioned on 08/2005 echo however  . OP (osteoporosis)   . Pneumonia 05/2013   "once"  . Syncope 06/10/11   "fell; I've been having these spells; off & on; last time was maybe 2 yr ago"  . Tachyarrhythmia 06/11/11   burst of ST  . Wrist fracture  left    Past Surgical History:  Procedure Laterality Date  . BREAST CYST EXCISION     right  . CATARACT EXTRACTION W/ INTRAOCULAR LENS  IMPLANT, BILATERAL    . COLECTOMY    . COLONOSCOPY    . INGUINAL HERNIA REPAIR     right  . TOTAL HIP ARTHROPLASTY     bilaterally  . TRANSTHORACIC ECHOCARDIOGRAM  08/20/2005   EF 60%     Current Outpatient Prescriptions  Medication Sig Dispense Refill  . acetaminophen (TYLENOL) 500 MG tablet Take 500-1,000 mg by mouth daily as needed for moderate pain (pain).     Marland Kitchen. aspirin 325 MG tablet Take 1 tablet (325 mg total) by mouth daily. 1 tablet 0  . CARTIA XT 180 MG 24 hr capsule TAKE 1 CAPSULE(180 MG) BY MOUTH DAILY 90 capsule 2  . Cranberry (SM CRANBERRY) 300 MG tablet Take 300 mg by mouth every other day.     Marland Kitchen. FLUZONE HIGH-DOSE 0.5 ML  SUSY Inject as directed once.  0  . furosemide (LASIX) 20 MG tablet Take 1 tablet (20 mg total) by mouth as needed (take as needed for incraesed leg swelling, do not use more than once a day). 30 tablet 0  . lactose free nutrition (BOOST PLUS) LIQD Take 237 mLs by mouth daily at 2 PM daily at 2 PM.  0  . metoprolol (LOPRESSOR) 50 MG tablet TAKE 1 TABLET(50 MG) BY MOUTH TWICE DAILY 180 tablet 2  . Multiple Vitamins-Minerals (MULTIVITAMIN GUMMIES WOMENS) CHEW Chew 1 each by mouth daily.     . Naphazoline HCl (CLEAR EYES OP) Place 1 drop into both eyes at bedtime as needed (dry eyes/ itching).    . Pumpkin Seed-Soy Germ (AZO BLADDER CONTROL/GO-LESS PO) Take 1 capsule by mouth at bedtime.    . vitamin C (ASCORBIC ACID) 500 MG tablet Take 500 mg by mouth daily as needed (for immune support).     No current facility-administered medications for this visit.     Allergies:   Sulfa drugs cross reactors and Irbesartan    Social History:  The patient  reports that she has never smoked. She has never used smokeless tobacco. She reports that she does not drink alcohol or use drugs.   Family History:  The patient's family history is not on file.    ROS:  Please see the history of present illness.    Review of Systems: Constitutional:  denies fever, chills, diaphoresis, appetite change and fatigue.  HEENT: denies photophobia, eye pain, redness, hearing loss, ear pain, congestion, sore throat, rhinorrhea, sneezing, neck pain, neck stiffness and tinnitus.  Respiratory: denies SOB, DOE, cough, chest tightness, and wheezing.  Cardiovascular: denies chest pain, palpitations and leg swelling.  Gastrointestinal: denies nausea, vomiting, abdominal pain, diarrhea, constipation, blood in stool.  Genitourinary: denies dysuria, urgency, frequency, hematuria, flank pain and difficulty urinating.  Musculoskeletal: denies  myalgias, back pain, joint swelling, arthralgias and gait problem.   Skin: denies pallor,  rash and wound.  Neurological: admits to dizziness,  Vertigo problems   Hematological: denies adenopathy, easy bruising, personal or family bleeding history.  Psychiatric/ Behavioral: denies suicidal ideation, mood changes, confusion, nervousness, sleep disturbance and agitation.       All other systems are reviewed and negative.    PHYSICAL EXAM: VS:  BP (!) 166/90 (BP Location: Right Arm, Patient Position: Sitting, Cuff Size: Normal)   Pulse 64   Ht 5\' 3"  (1.6 m)  , BMI There is no height or  weight on file to calculate BMI. GEN: Well nourished, well developed, in no acute distress ,  HEENT: normal  Neck: no JVD, carotid bruits, or masses Cardiac:  Irregularly Irregular ;  Soft systolic murmur.  no edema  Respiratory:  clear to auscultation bilaterally, normal work of breathing GI: soft, nontender, nondistended, + BS MS: no deformity or atrophy  Skin: warm and dry, no rash Neuro:  Strength and sensation are intact Psych: normal  EKG:  EKG is ordered today. Atrial fib with rate of 72.   Recent Labs: No results found for requested labs within last 8760 hours.    Lipid Panel    Component Value Date/Time   CHOL 126 11/17/2013 0329   TRIG 81 11/17/2013 0329   HDL 56 11/17/2013 0329   CHOLHDL 2.3 11/17/2013 0329   VLDL 16 11/17/2013 0329   LDLCALC 54 11/17/2013 0329      Wt Readings from Last 3 Encounters:  10/11/15 119 lb 3.2 oz (54.1 kg)  09/29/14 121 lb 9.6 oz (55.2 kg)  03/21/14 124 lb 3.2 oz (56.3 kg)      Other studies Reviewed: Additional studies/ records that were reviewed today include: . Review of the above records demonstrates:    ASSESSMENT AND PLAN:  1.  HTN:  Will add Losartan 50 mg a day , will check BMP in 3 weeks . She had tachycardia at one point after receiving Irbesartan but she is now in metoprolol and I doubt that she will have tachycardia  Continue Metoprolol 50 BID and Diltiazem 180 a day  She is not eating much salt  will see her in  2 months    2. Atrial Fibrillation - chronic atrial fib . , not on anticoagulation because of risk of falling .  Discussed this with patient and daughter.  Continue same medications. She seems to be very stable.  4. Mitral Valve Prolapse  5. H/o Rectal Cancer  5. Osteoporosis  7. Right Femoral neck fracture 2010  8. Left Wrist fracture    Current medicines are reviewed at length with the patient today.  The patient does not have concerns regarding medicines.  The following changes have been made:  no change   Disposition:        Signed, Kristeen MissPhilip Wende Longstreth, MD  12/31/2015 10:30 AM    [facility] [address], [place], [place]  [address] Phone: [phone](336) [phone]; Fax: [phone]

## 2015-12-31 NOTE — Patient Instructions (Signed)
Medication Instructions:  START Losartan 50 mg once daily   Labwork: Your physician recommends that you return for lab work in: 3 weeks for basic metabolic panel   Testing/Procedures: None Ordered   Follow-Up: Your physician recommends that you schedule a follow-up appointment in: 2 months with Dr. Elease HashimotoNahser.    If you need a refill on your cardiac medications before your next appointment, please call your pharmacy.   Thank you for choosing [facility]! Eligha BridegroomMichelle Belia Febo, RN [phone]

## 2015-12-31 NOTE — Telephone Encounter (Signed)
New message   Pt c/o BP issue: STAT if pt c/o blurred vision, one-sided weakness or slurred speech  1. What are your last 5 BP readings? 197/107  165/79 2. Are you having any other symptoms (ex. Dizziness, headache, blurred vision, passed out)? Light headed  3. What is your BP issue? high

## 2015-12-31 NOTE — Telephone Encounter (Signed)
Spoke with patient's daughter who states she is concerned about patient and would like to bring her in.  States patient is having light-headedness, blurred vision, and headache yesterday.  States her BP is high, especially at night.  States systolic is 190's mmHg.  States usually it improves after morning meds (165/87 mmHg).  States yesterday morning BP was 195/106.  Patient denies pain; daughter states she says she just doesn't feel right.  I offered her an appointment with Dr. Elease HashimotoNahser today at 10. She verbalized agreement and is coming in for appointment.

## 2016-01-21 ENCOUNTER — Other Ambulatory Visit (INDEPENDENT_AMBULATORY_CARE_PROVIDER_SITE_OTHER): Payer: Federal, State, Local not specified - PPO

## 2016-01-21 DIAGNOSIS — I10 Essential (primary) hypertension: Secondary | ICD-10-CM

## 2016-01-21 LAB — BASIC METABOLIC PANEL
BUN: 28 mg/dL — AB (ref 7–25)
CO2: 30 mmol/L (ref 20–31)
Calcium: 9.3 mg/dL (ref 8.6–10.4)
Chloride: 107 mmol/L (ref 98–110)
Creat: 0.66 mg/dL (ref 0.60–0.88)
Glucose, Bld: 121 mg/dL — ABNORMAL HIGH (ref 65–99)
Potassium: 4 mmol/L (ref 3.5–5.3)
Sodium: 145 mmol/L (ref 135–146)

## 2016-01-24 ENCOUNTER — Telehealth: Payer: Self-pay | Admitting: Cardiovascular Disease

## 2016-01-24 NOTE — Telephone Encounter (Signed)
Lab results and plan to continue current medications reviewed with patient's daughter who verbalized understanding and agreement

## 2016-01-24 NOTE — Telephone Encounter (Signed)
New message ° °Pt's daughter is returning call ° °Please call back °

## 2016-02-11 DIAGNOSIS — K5792 Diverticulitis of intestine, part unspecified, without perforation or abscess without bleeding: Secondary | ICD-10-CM

## 2016-02-11 HISTORY — DX: Diverticulitis of intestine, part unspecified, without perforation or abscess without bleeding: K57.92

## 2016-03-03 ENCOUNTER — Encounter (INDEPENDENT_AMBULATORY_CARE_PROVIDER_SITE_OTHER): Payer: Self-pay

## 2016-03-03 ENCOUNTER — Encounter: Payer: Self-pay | Admitting: Cardiovascular Disease

## 2016-03-03 ENCOUNTER — Ambulatory Visit (INDEPENDENT_AMBULATORY_CARE_PROVIDER_SITE_OTHER): Payer: Federal, State, Local not specified - PPO | Admitting: Cardiovascular Disease

## 2016-03-03 VITALS — BP 140/82 | HR 72 | Ht 63.0 in

## 2016-03-03 DIAGNOSIS — R55 Syncope and collapse: Secondary | ICD-10-CM | POA: Diagnosis not present

## 2016-03-03 DIAGNOSIS — I482 Chronic atrial fibrillation, unspecified: Secondary | ICD-10-CM

## 2016-03-03 DIAGNOSIS — I10 Essential (primary) hypertension: Secondary | ICD-10-CM | POA: Diagnosis not present

## 2016-03-03 NOTE — Progress Notes (Signed)
Cardiology Office Note   Date:  03/03/2016   ID:  [name], DOB 10-Aug-1914, MRN [id number]  PCP:  Egbert GaribaldiMillsaps, [name], NP  Cardiologist:   Kristeen MissPhilip Nahser, MD   Chief Complaint  Patient presents with  . Follow-up    HTN, atrial fib.      1. Atrial Fibrillation  2. Hypertension  3. Mitral Valve Prolapse  4. H/o Rectal Cancer  5. Osteoporosis  6. Right Femoral neck fracture 2010  7. Left Wrist fracture   She's done very well since she left the hospital. She was admitted in April with episodes of orthostatic hypotension. We made some medication adjustments including stopping her HCTZ and we decreased her Toprol dose. We have had to cut her blood pressure medicines such that she has l high readings especially in the morning. This is because her drops her blood pressure dropped so quickly when she stands up.   She continues to be unsteady when she walks. She occasionally will walk with a cane or walker. We have encouraged her to use her cane and walker every time she goes out to walk.  May 25, 2012:  [name] is doing well from a cardiac standpoint. She has had lots of UTIs but these are better.   She has significant orthostasis. Her BP is usually pretty good in the mornings - 140-150.   April 18, 2013:  [name] was admitted to the hospital recently because of syncope.  Sept. 9, 2015:  [name] is doing well. No further episodes of syncope. Fairly steady with her walker.  No CP, breathing is ok.  She has had a-fib. Takes Cartia which helped.  Dec 07, 2013  [name] was recently seen in the hospital for a TIA. She presented with weakness on her left side. She has a history of atrial fibrillation but is not on any coagulation because of her history of falling.  She is almost back to normal. Still has some foot weakness.  No speech issues. Does have some short term memory issues according to daughter.    Feb. 9, 2016:  [name] is seen today for  follow up visit.  Has vertigo symptoms - especially when she turns her head to the left.   Has moved in with daughter.   09/29/2014:   Aug. 31, 2017: Doing well No CP or dyspnea Some stomache issues  Nov. 20, 2017:  [name] is seen today for evaluation of her HTN She's had episodes of hypertension for years but more recently has been having orthostatic hypotension.  Daughter has kept BP log  Has had a headache on occasion.   Jan. 22, 2018:  [name] is seen today for follow-up of her hypertension and history of atrial fibrillation. Still has some dizzy spells. Larey SeatFell and now has a right black eye. No CP or dyspnea.   Past Medical History:  Diagnosis Date  . Anxiety    "now & then; I get over it"; denies RX  . Arthritis   . Atrial fibrillation (HCC)    08/2005, normal EF at that time  . Blood transfusion    "my own blood"  . Dizziness   . Fracture of femoral neck, right (HCC) 2010  . Headache(784.0)    "terrible while going thru menopause; none since then"  . History of rectal cancer   . Hypertension   . MVP (mitral valve prolapse)    Not mentioned on 08/2005 echo however  . OP (osteoporosis)   . Pneumonia 05/2013   "  once"  . Syncope 06/10/11   "fell; I've been having these spells; off & on; last time was maybe 2 yr ago"  . Tachyarrhythmia 06/11/11   burst of ST  . Wrist fracture    left    Past Surgical History:  Procedure Laterality Date  . BREAST CYST EXCISION     right  . CATARACT EXTRACTION W/ INTRAOCULAR LENS  IMPLANT, BILATERAL    . COLECTOMY    . COLONOSCOPY    . INGUINAL HERNIA REPAIR     right  . TOTAL HIP ARTHROPLASTY     bilaterally  . TRANSTHORACIC ECHOCARDIOGRAM  08/20/2005   EF 60%     Current Outpatient Prescriptions  Medication Sig Dispense Refill  . acetaminophen (TYLENOL) 500 MG tablet Take 500-1,000 mg by mouth daily as needed for moderate pain (pain).     Marland Kitchen aspirin 325 MG tablet Take 1 tablet (325 mg total) by mouth daily. 1 tablet 0  .  CARTIA XT 180 MG 24 hr capsule TAKE 1 CAPSULE(180 MG) BY MOUTH DAILY 90 capsule 2  . Cranberry (SM CRANBERRY) 300 MG tablet Take 300 mg by mouth every other day.     Marland Kitchen FLUZONE HIGH-DOSE 0.5 ML SUSY Inject as directed once.  0  . furosemide (LASIX) 20 MG tablet Take 1 tablet (20 mg total) by mouth as needed (take as needed for incraesed leg swelling, do not use more than once a day). 30 tablet 0  . lactose free nutrition (BOOST PLUS) LIQD Take 237 mLs by mouth daily at 2 PM daily at 2 PM.  0  . losartan (COZAAR) 50 MG tablet Take 1 tablet (50 mg total) by mouth daily. 30 tablet 11  . metoprolol (LOPRESSOR) 50 MG tablet TAKE 1 TABLET(50 MG) BY MOUTH TWICE DAILY 180 tablet 2  . Multiple Vitamins-Minerals (MULTIVITAMIN GUMMIES WOMENS) CHEW Chew 1 each by mouth daily.     . Naphazoline HCl (CLEAR EYES OP) Place 1 drop into both eyes at bedtime as needed (dry eyes/ itching).    . vitamin C (ASCORBIC ACID) 500 MG tablet Take 500 mg by mouth daily as needed (for immune support).    . Pumpkin Seed-Soy Germ (AZO BLADDER CONTROL/GO-LESS PO) Take 1 capsule by mouth at bedtime.     No current facility-administered medications for this visit.     Allergies:   Sulfa drugs cross reactors and Irbesartan    Social History:  The patient  reports that she has never smoked. She has never used smokeless tobacco. She reports that she does not drink alcohol or use drugs.   Family History:  The patient's family history is not on file.    ROS:  Please see the history of present illness.    Review of Systems: Constitutional:  denies fever, chills, diaphoresis, appetite change and fatigue.  HEENT: denies photophobia, eye pain, redness, hearing loss, ear pain, congestion, sore throat, rhinorrhea, sneezing, neck pain, neck stiffness and tinnitus.  Respiratory: denies SOB, DOE, cough, chest tightness, and wheezing.  Cardiovascular: denies chest pain, palpitations and leg swelling.  Gastrointestinal: denies nausea,  vomiting, abdominal pain, diarrhea, constipation, blood in stool.  Genitourinary: denies dysuria, urgency, frequency, hematuria, flank pain and difficulty urinating.  Musculoskeletal: denies  myalgias, back pain, joint swelling, arthralgias and gait problem.   Skin: denies pallor, rash and wound.  Neurological: admits to dizziness,  Vertigo problems   Hematological: denies adenopathy, easy bruising, personal or family bleeding history.  Psychiatric/ Behavioral: denies suicidal ideation, mood changes,  confusion, nervousness, sleep disturbance and agitation.       All other systems are reviewed and negative.    PHYSICAL EXAM: VS:  BP 140/82 (BP Location: Right Arm, Patient Position: Sitting, Cuff Size: Normal)   Pulse 72   Ht 5\' 3"  (1.6 m)   SpO2 96%  , BMI There is no height or weight on file to calculate BMI. GEN: Well nourished, well developed, in no acute distress ,  HEENT: normal  Neck: no JVD, carotid bruits, or masses Cardiac:  Irregularly Irregular ;  Soft systolic murmur.  no edema  Respiratory:  clear to auscultation bilaterally, normal work of breathing GI: soft, nontender, nondistended, + BS MS: no deformity or atrophy  Skin: warm and dry, no rash Neuro:  Strength and sensation are intact Psych: normal  EKG:  EKG is not ordered today.   Recent Labs: 01/21/2016: BUN 28; Creat 0.66; Potassium 4.0; Sodium 145    Lipid Panel    Component Value Date/Time   CHOL 126 11/17/2013 0329   TRIG 81 11/17/2013 0329   HDL 56 11/17/2013 0329   CHOLHDL 2.3 11/17/2013 0329   VLDL 16 11/17/2013 0329   LDLCALC 54 11/17/2013 0329      Wt Readings from Last 3 Encounters:  10/11/15 119 lb 3.2 oz (54.1 kg)  09/29/14 121 lb 9.6 oz (55.2 kg)  03/21/14 124 lb 3.2 oz (56.3 kg)      Other studies Reviewed: Additional studies/ records that were reviewed today include: . Review of the above records demonstrates:    ASSESSMENT AND PLAN:  1.  HTN:  Continue current meds.      2. Atrial Fibrillation - chronic atrial fib . , not on anticoagulation because of risk of falling .  Discussed this with patient and daughter.  Continue same medications. She seems to be very stable.  Will see her in 6 her in 6 months .   4. Mitral Valve Prolapse  5. H/o Rectal Cancer  5. Osteoporosis  7. Right Femoral neck fracture 2010      Current medicines are reviewed at length with the patient today.  The patient does not have concerns regarding medicines.  The following changes have been made:  no change   Disposition:        Signed, Kristeen Miss, MD  03/03/2016 11:00 AM    [facility] [address] [place], [place], [place]  [address] Phone: [phone]; Fax: [phone]

## 2016-03-03 NOTE — Patient Instructions (Addendum)
Medication Instructions:    Your physician recommends that you continue on your current medications as directed. Please refer to the Current Medication list given to you today.  --- If you need a refill on your cardiac medications before your next appointment, please call your pharmacy. ---  Labwork:  None ordered  Testing/Procedures:  None ordered  Follow-Up:  Your physician wants you to follow-up in: 6 months with Dr. Nahser.  You will receive a reminder letter in the mail two months in advance. If you don't receive a letter, please call our office to schedule the follow-up appointment.  Thank you for choosing [facility]!!            

## 2016-03-28 ENCOUNTER — Encounter (HOSPITAL_COMMUNITY): Payer: Self-pay | Admitting: *Deleted

## 2016-03-28 ENCOUNTER — Inpatient Hospital Stay (HOSPITAL_COMMUNITY)
Admission: EM | Admit: 2016-03-28 | Discharge: 2016-04-01 | DRG: 378 | Disposition: A | Discharge status: Home / Self Care | Payer: Medicare Other | Attending: Oncology | Admitting: Oncology

## 2016-03-28 DIAGNOSIS — Z85048 Personal history of other malignant neoplasm of rectum, rectosigmoid junction, and anus: Secondary | ICD-10-CM

## 2016-03-28 DIAGNOSIS — I482 Chronic atrial fibrillation: Secondary | ICD-10-CM | POA: Diagnosis present

## 2016-03-28 DIAGNOSIS — I4891 Unspecified atrial fibrillation: Secondary | ICD-10-CM

## 2016-03-28 DIAGNOSIS — Z96643 Presence of artificial hip joint, bilateral: Secondary | ICD-10-CM | POA: Diagnosis present

## 2016-03-28 DIAGNOSIS — K625 Hemorrhage of anus and rectum: Secondary | ICD-10-CM | POA: Diagnosis present

## 2016-03-28 DIAGNOSIS — K921 Melena: Secondary | ICD-10-CM

## 2016-03-28 DIAGNOSIS — D62 Acute posthemorrhagic anemia: Secondary | ICD-10-CM | POA: Diagnosis present

## 2016-03-28 DIAGNOSIS — K5731 Diverticulosis of large intestine without perforation or abscess with bleeding: Principal | ICD-10-CM | POA: Diagnosis present

## 2016-03-28 DIAGNOSIS — Z86010 Personal history of colonic polyps: Secondary | ICD-10-CM | POA: Diagnosis not present

## 2016-03-28 DIAGNOSIS — Z79899 Other long term (current) drug therapy: Secondary | ICD-10-CM | POA: Diagnosis not present

## 2016-03-28 DIAGNOSIS — Z7982 Long term (current) use of aspirin: Secondary | ICD-10-CM | POA: Diagnosis not present

## 2016-03-28 DIAGNOSIS — I10 Essential (primary) hypertension: Secondary | ICD-10-CM | POA: Diagnosis present

## 2016-03-28 DIAGNOSIS — K922 Gastrointestinal hemorrhage, unspecified: Secondary | ICD-10-CM | POA: Diagnosis not present

## 2016-03-28 DIAGNOSIS — Z8673 Personal history of transient ischemic attack (TIA), and cerebral infarction without residual deficits: Secondary | ICD-10-CM

## 2016-03-28 DIAGNOSIS — Z7951 Long term (current) use of inhaled steroids: Secondary | ICD-10-CM

## 2016-03-28 LAB — CBC
HCT: 36.9 % (ref 36.0–46.0)
HCT: 31.4 % — ABNORMAL LOW (ref 36.0–46.0)
HEMOGLOBIN: 10.3 g/dL — AB (ref 12.0–15.0)
Hemoglobin: 12 g/dL (ref 12.0–15.0)
MCH: 32.9 pg (ref 26.0–34.0)
MCH: 32.9 pg (ref 26.0–34.0)
MCHC: 32.5 g/dL (ref 30.0–36.0)
MCHC: 32.8 g/dL (ref 30.0–36.0)
MCV: 101.1 fL — ABNORMAL HIGH (ref 78.0–100.0)
MCV: 100.3 fL — ABNORMAL HIGH (ref 78.0–100.0)
PLATELETS: 176 10*3/uL (ref 150–400)
Platelets: 174 10*3/uL (ref 150–400)
RBC: 3.65 MIL/uL — ABNORMAL LOW (ref 3.87–5.11)
RBC: 3.13 MIL/uL — AB (ref 3.87–5.11)
RDW: 13.3 % (ref 11.5–15.5)
RDW: 13 % (ref 11.5–15.5)
WBC: 7.4 10*3/uL (ref 4.0–10.5)
WBC: 7.6 10*3/uL (ref 4.0–10.5)

## 2016-03-28 LAB — MRSA PCR SCREENING: MRSA BY PCR: NEGATIVE

## 2016-03-28 LAB — COMPREHENSIVE METABOLIC PANEL
ALT: 11 U/L — AB (ref 14–54)
AST: 23 U/L (ref 15–41)
Albumin: 3.5 g/dL (ref 3.5–5.0)
Alkaline Phosphatase: 63 U/L (ref 38–126)
Anion gap: 8 (ref 5–15)
BILIRUBIN TOTAL: 1 mg/dL (ref 0.3–1.2)
BUN: 21 mg/dL — ABNORMAL HIGH (ref 6–20)
CO2: 25 mmol/L (ref 22–32)
CREATININE: 0.67 mg/dL (ref 0.44–1.00)
Calcium: 8.8 mg/dL — ABNORMAL LOW (ref 8.9–10.3)
Chloride: 110 mmol/L (ref 101–111)
Glucose, Bld: 90 mg/dL (ref 65–99)
Potassium: 3.7 mmol/L (ref 3.5–5.1)
SODIUM: 143 mmol/L (ref 135–145)
TOTAL PROTEIN: 6.6 g/dL (ref 6.5–8.1)

## 2016-03-28 LAB — URINALYSIS, ROUTINE W REFLEX MICROSCOPIC
BILIRUBIN URINE: NEGATIVE
GLUCOSE, UA: NEGATIVE mg/dL
KETONES UR: 5 mg/dL — AB
Leukocytes, UA: NEGATIVE
NITRITE: NEGATIVE
Protein, ur: NEGATIVE mg/dL
Specific Gravity, Urine: 1.015 (ref 1.005–1.030)
pH: 5 (ref 5.0–8.0)

## 2016-03-28 LAB — APTT: APTT: 33 s (ref 24–36)

## 2016-03-28 LAB — I-STAT CHEM 8, ED
BUN: 25 mg/dL — ABNORMAL HIGH (ref 6–20)
CALCIUM ION: 1.13 mmol/L — AB (ref 1.15–1.40)
Chloride: 108 mmol/L (ref 101–111)
Creatinine, Ser: 0.7 mg/dL (ref 0.44–1.00)
GLUCOSE: 90 mg/dL (ref 65–99)
HCT: 34 % — ABNORMAL LOW (ref 36.0–46.0)
HEMOGLOBIN: 11.6 g/dL — AB (ref 12.0–15.0)
POTASSIUM: 3.6 mmol/L (ref 3.5–5.1)
Sodium: 146 mmol/L — ABNORMAL HIGH (ref 135–145)
TCO2: 27 mmol/L (ref 0–100)

## 2016-03-28 LAB — PREPARE RBC (CROSSMATCH)

## 2016-03-28 LAB — PROTIME-INR
INR: 1.27
PROTHROMBIN TIME: 16 s — AB (ref 11.4–15.2)

## 2016-03-28 MED ORDER — SODIUM CHLORIDE 0.9 % IV SOLN: Freq: Once | INTRAVENOUS | Status: DC

## 2016-03-28 MED ORDER — SODIUM CHLORIDE 0.9 % IV SOLN
Freq: Once | INTRAVENOUS | Status: AC
  Administered 2016-03-28: 21:00:00 via INTRAVENOUS

## 2016-03-28 MED ORDER — SODIUM CHLORIDE 0.9 % IV BOLUS (SEPSIS)
1000.0000 mL | Freq: Once | INTRAVENOUS | Status: AC
Start: 2016-03-28 — End: 2016-03-28
  Administered 2016-03-28: 1000 mL via INTRAVENOUS

## 2016-03-28 MED ORDER — SODIUM CHLORIDE 0.9 % IV BOLUS (SEPSIS): 1000.0000 mL | Freq: Once | INTRAVENOUS | Status: DC

## 2016-03-28 MED ORDER — WHITE PETROLATUM GEL
Status: AC
  Administered 2016-03-28: 1
  Filled 2016-03-28: qty 1

## 2016-03-28 MED ORDER — LOSARTAN POTASSIUM 50 MG PO TABS: 50.0000 mg | ORAL_TABLET | Freq: Every day | ORAL | Status: DC

## 2016-03-28 MED ORDER — PANTOPRAZOLE SODIUM 40 MG IV SOLR
40.0000 mg | Freq: Once | INTRAVENOUS | Status: AC
  Administered 2016-03-28: 40 mg via INTRAVENOUS
  Filled 2016-03-28: qty 40

## 2016-03-28 MED ORDER — POTASSIUM CHLORIDE IN NACL 20-0.9 MEQ/L-% IV SOLN
INTRAVENOUS | Status: AC
  Administered 2016-03-28: 15:00:00 via INTRAVENOUS
  Filled 2016-03-28: qty 1000

## 2016-03-28 NOTE — ED Notes (Signed)
Pt was incontinent of stool. Stool was frank blood with sediment in it. Pt saturated incontinent pad with bloody stool. Then pt used bedpan and approximately put out 150cc of bloody stool.

## 2016-03-28 NOTE — ED Triage Notes (Signed)
Pt in from home c/o rectal bleeding, pt family reports taking blood thinners, pts family reports HTN & increasing confusion,pt alert at this time, pt follows commands

## 2016-03-28 NOTE — Progress Notes (Signed)
Daughter is now here reassuring patient.

## 2016-03-28 NOTE — ED Notes (Signed)
Admitting MD at bedside stated pt did not need blood transfusion at this time.

## 2016-03-28 NOTE — H&P (Signed)
Date: 03/28/2016               Patient Name:  [name] MRN: [id number]  DOB: 08-08-1914 Age / Sex: [age] y.o., female   PCP: Marva PandaKimberly Millsaps, NP         Medical Service: Internal Medicine Teaching Service         Attending Physician: Dr. Doneen PoissonLawrence Klima, MD    First Contact: Dr. Samuella CotaSvalina Pager: [phone]  Second Contact: Dr. Karma GreaserBoswell Pager: [phone]       After Hours (After 5p/  First Contact Pager: [phone]  weekends / holidays): Second Contact Pager: [phone]   Chief Complaint: GI bleed  History of Present Illness:   [age] yo female with hx of Afib (not on anticoag, on full asa), tubulovillous adenoma s/p rectosigmoid resection on 2007 (no invasive carcinoma was seen on path), presents with bright red blood per rectum along with clots starting at 6:30 today. No pain associated with it, no dark stool, no GERD symptoms, no hematemesis. Never had this problem in the past. No sob, chest pain, dizziness. Denies any other symptoms currently. In the ED, she continued to have bleeding even between bowel movements. Vitals stable, no tachycardia. hgb 12.0. She was typed and crossed, 2units pRBC was ordered but I stopped it as she is not symptomatic. Others labs are unremakrable. GI was called by EDP, consult pending.    Meds:  Current Meds  Medication Sig  . acetaminophen (TYLENOL) 500 MG tablet Take 500-1,000 mg by mouth at bedtime.   Marland Kitchen. aspirin 325 MG tablet Take 1 tablet (325 mg total) by mouth daily.  Marland Kitchen. CARTIA XT 180 MG 24 hr capsule TAKE 1 CAPSULE(180 MG) BY MOUTH DAILY  . Cranberry (SM CRANBERRY) 300 MG tablet Take 300 mg by mouth every other day.   . diltiazem (DILACOR XR) 180 MG 24 hr capsule Take 180 mg by mouth daily.  . fluticasone (FLONASE) 50 MCG/ACT nasal spray Place 1 spray into both nostrils daily.  . furosemide (LASIX) 20 MG tablet Take 1 tablet (20 mg total) by mouth as needed (take as needed for incraesed leg swelling, do not use more than once a day).  Marland Kitchen. lactose free  nutrition (BOOST PLUS) LIQD Take 237 mLs by mouth daily at 2 PM daily at 2 PM.  . losartan (COZAAR) 50 MG tablet Take 1 tablet (50 mg total) by mouth daily.  . metoprolol (LOPRESSOR) 50 MG tablet TAKE 1 TABLET(50 MG) BY MOUTH TWICE DAILY  . Multiple Vitamins-Minerals (MULTIVITAMIN GUMMIES WOMENS) CHEW Chew 1 each by mouth daily.   . Naphazoline HCl (CLEAR EYES OP) Place 1 drop into both eyes at bedtime as needed (dry eyes/ itching).  . Pumpkin Seed-Soy Germ (AZO BLADDER CONTROL/GO-LESS PO) Take 1 capsule by mouth at bedtime.     Allergies: Allergies as of 03/28/2016 - Review Complete 03/28/2016  Allergen Reaction Noted  . Sulfa drugs cross reactors Hives and Other (See Comments) 11/06/2010  . Irbesartan Other (See Comments) 04/05/2013   Past Medical History:  Diagnosis Date  . Anxiety    "now & then; I get over it"; denies RX  . Arthritis   . Atrial fibrillation (HCC)    08/2005, normal EF at that time  . Blood transfusion    "my own blood"  . Dizziness   . Fracture of femoral neck, right (HCC) 2010  . Headache(784.0)    "terrible while going thru menopause; none since then"  . History of rectal cancer   .  Hypertension   . MVP (mitral valve prolapse)    Not mentioned on 08/2005 echo however  . OP (osteoporosis)   . Pneumonia 05/2013   "once"  . Syncope 06/10/11   "fell; I've been having these spells; off & on; last time was maybe 2 yr ago"  . Tachyarrhythmia 06/11/11   burst of ST  . Wrist fracture    left    Family History:  Family History  Problem Relation Age of Onset  . Heart disease Neg Hx      Social History:  Social History   Social History  . Marital status: Widowed    Spouse name: N/A  . Number of children: N/A  . Years of education: N/A   Social History Main Topics  . Smoking status: Never Smoker  . Smokeless tobacco: Never Used  . Alcohol use No  . Drug use: No  . Sexual activity: No   Other Topics Concern  . None   Social History Narrative    Originally from [place]. Loves to garden.     Review of Systems: A complete ROS was negative except as per HPI.   Physical Exam: Blood pressure 178/94, pulse 77, temperature 98 F (36.7 C), temperature source Oral, resp. rate 20, SpO2 98 %.  Physical Exam  Constitutional: She is oriented to person, place, and time.  Elderly female. Awake, lying in hospital bed, NAD. Is difficulty hearing, daughter helped with the history. She appears comfortable.   HENT:  Head: Normocephalic and atraumatic.  Mouth/Throat: No oropharyngeal exudate.  Eyes: Conjunctivae are normal. Right eye exhibits no discharge. Left eye exhibits no discharge. No scleral icterus.  Neck: Normal range of motion.  Cardiovascular: Normal rate and regular rhythm.   Systolic murmur present  Respiratory: Effort normal and breath sounds normal. No respiratory distress. She has no wheezes.  GI: Soft. Bowel sounds are normal. She exhibits no distension. There is no tenderness.  Genitourinary:  Genitourinary Comments: Her bed sheet is filled with blood. Rectum has bright red to brown blood present. No visible hemorrhoids or fissures or mass. No tenderness.   Musculoskeletal: Normal range of motion. She exhibits no edema or tenderness.  Neurological: She is alert and oriented to person, place, and time. No cranial nerve deficit.  Psychiatric: She has a normal mood and affect.     EKG: none  CXR: none  Assessment & Plan by Problem: Active Problems:   HTN (hypertension)   A-fib (HCC)   GI bleed  83 yo f with afib (on full dose asa) presents with GI bleed.  Lower GI bleed Has BRBPR starting this morning, painless. No hematemesis or melena to suggest UGI, mostly Lower GI bleed. Vitals overall stable so unlikely to be a rapid UGI. Rectal exam only showed blood. Unclear etiology, ddx includes internal hemorrhoid, mucosal bleeding from full dose aspirin, divercular bleed, or polyp or carcinoma. Does have hx of  tubulovillous adenoma s/p rectosigmoid resection on 2007, path did not show any invasive carcinoma at that time.  Patient currently does not appear to be symptomatic from the bleeding. Her hgb is around 12. She was typed and crossed. I held the blood transfusion order for now given she is asymptomatic. - trend CBC - check PT-INR, aptt - hold asa - f/up GI recs. - check orthostats -gentle fluid as patient is NPO.  - hold metop and dilt.  Afib Not on anticoag due to her age and fall risk. Was on full dose asa. - hold  full asa - hold dilt and metop as above  HTN  -Cont losartan, hold lasix, dilt and metop for now as above.   Dispo: Admit patient to Inpatient with expected length of stay greater than 2 midnights.  Signed: Hyacinth Meeker, MD 03/28/2016, 10:56 AM  Pager: [phone]

## 2016-03-28 NOTE — ED Provider Notes (Signed)
[facility] DEPT Provider Note   CSN: [id number] Arrival date & time: 03/28/16  0808     History   Chief Complaint Chief Complaint  Patient presents with  . GI Bleeding    HPI [name] is a 83 y.o. female. With history of atrial fibrillation not on anticoagulation due to hx of falls, H/O rectal cancer? and  hypertension that presents to the ED for blood seen in the toilet this morning.  Daughter is at the bedside and helped provide the history.  She reports the patient had 2 episodes of bloody stool this morning.  Daughter reports the stool was not dark.  Patient complained of mild non specific abdominal pain last night.  Patient also has a decrease in appetitie this morning.  She denies nausea/vomiting, fever, shortness of breath or chest pain.  Patient takes an aspirin and tylenol daily.  She denies NSAID use or alcohol use.  Daughter reports patient had colon surgery 8-10 years ago due to a polyp but could not recall any more information.  Patient has never had episodes of bleeding in the past.    HPI  Past Medical History:  Diagnosis Date  . Anxiety    "now & then; I get over it"; denies RX  . Arthritis   . Atrial fibrillation ([place])    08/2005, normal EF at that time  . Blood transfusion    "my own blood"  . Dizziness   . Fracture of femoral neck, right ([place]) 2010  . Headache(784.0)    "terrible while going thru menopause; none since then"  . History of rectal cancer   . Hypertension   . MVP (mitral valve prolapse)    Not mentioned on 08/2005 echo however  . OP (osteoporosis)   . Pneumonia 05/2013   "once"  . Syncope 06/10/11   "fell; I've been having these spells; off & on; last time was maybe 2 yr ago"  . Tachyarrhythmia 06/11/11   burst of ST  . Wrist fracture    left    Patient Active Problem List   Diagnosis Date Noted  . Acute lower GI bleeding   . Atrial fibrillation with rapid ventricular response ([place])   . Hematochezia   . GI bleed 03/28/2016  .  Vertigo 03/21/2014  . Leg swelling 11/18/2013  . TIA (transient ischemic attack) 11/16/2013  . A-fib ([place]) 11/16/2013  . DNR (do not resuscitate) 11/16/2013  . Dizziness 11/16/2013  . HCAP (healthcare-associated pneumonia) 05/09/2013  . Fall at home 04/06/2013  . Hypokalemia 04/06/2013  . Lumbar compression fracture ([place]) 04/05/2013  . Atrial fibrillation with RVR ([place]) 04/05/2013  . Orthostasis 06/12/2011  . Near syncope 06/12/2011  . HTN (hypertension) 11/20/2010  . Hyperlipidemia 11/20/2010    Past Surgical History:  Procedure Laterality Date  . BREAST CYST EXCISION     right  . CATARACT EXTRACTION W/ INTRAOCULAR LENS  IMPLANT, BILATERAL    . COLECTOMY    . COLONOSCOPY    . INGUINAL HERNIA REPAIR     right  . TOTAL HIP ARTHROPLASTY     bilaterally  . TRANSTHORACIC ECHOCARDIOGRAM  08/20/2005   EF 60%    OB History    No data available       Home Medications    Prior to Admission medications   Medication Sig Start Date End Date Taking? Authorizing Provider  acetaminophen (TYLENOL) 500 MG tablet Take 500-1,000 mg by mouth at bedtime.    Yes Historical Provider, MD  aspirin 325  MG tablet Take 1 tablet (325 mg total) by mouth daily. 11/18/13  Yes Domenic Polite, MD  CARTIA XT 180 MG 24 hr capsule TAKE 1 CAPSULE(180 MG) BY MOUTH DAILY 12/24/15  Yes Thayer Headings, MD  Cranberry (SM CRANBERRY) 300 MG tablet Take 300 mg by mouth every other day.    Yes Historical Provider, MD  diltiazem (DILACOR XR) 180 MG 24 hr capsule Take 180 mg by mouth daily.   Yes Historical Provider, MD  fluticasone (FLONASE) 50 MCG/ACT nasal spray Place 1 spray into both nostrils daily.   Yes Historical Provider, MD  furosemide (LASIX) 20 MG tablet Take 1 tablet (20 mg total) by mouth as needed (take as needed for incraesed leg swelling, do not use more than once a day). 11/18/13  Yes Domenic Polite, MD  lactose free nutrition (BOOST PLUS) LIQD Take 237 mLs by mouth daily at 2 PM daily at 2 PM.  04/08/13  Yes Ripudeep K Rai, MD  losartan (COZAAR) 50 MG tablet Take 1 tablet (50 mg total) by mouth daily. 12/31/15 12/30/16 Yes Thayer Headings, MD  metoprolol (LOPRESSOR) 50 MG tablet TAKE 1 TABLET(50 MG) BY MOUTH TWICE DAILY 12/24/15  Yes Thayer Headings, MD  Multiple Vitamins-Minerals (MULTIVITAMIN GUMMIES WOMENS) CHEW Chew 1 each by mouth daily.    Yes Historical Provider, MD  Naphazoline HCl (CLEAR EYES OP) Place 1 drop into both eyes at bedtime as needed (dry eyes/ itching).   Yes Historical Provider, MD  Pumpkin Seed-Soy Germ (AZO BLADDER CONTROL/GO-LESS PO) Take 1 capsule by mouth at bedtime.   Yes Historical Provider, MD    Family History Family History  Problem Relation Age of Onset  . Heart disease Neg Hx     Social History Social History  Substance Use Topics  . Smoking status: Never Smoker  . Smokeless tobacco: Never Used  . Alcohol use No     Allergies   Sulfa drugs cross reactors and Irbesartan   Review of Systems Review of Systems  Constitutional: Negative for appetite change.  Respiratory: Negative for shortness of breath.   Cardiovascular: Negative for chest pain.  Gastrointestinal: Negative for rectal pain.  Genitourinary:       Suprapubic pressure  Musculoskeletal: Negative for back pain.  Neurological: Positive for dizziness. Negative for syncope.     Physical Exam Updated Vital Signs BP 133/87   Pulse (!) 152   Temp 98.7 F (37.1 C) (Oral)   Resp 20   Ht 5' 3" (1.6 m) Comment: 53  Wt 52.3 kg   SpO2 97%   BMI 20.42 kg/m   Physical Exam  Cardiovascular:  Irregularly Irregular  Pulmonary/Chest: Effort normal and breath sounds normal. No respiratory distress. She has no wheezes. She has no rales.  Abdominal: Soft. Bowel sounds are normal.  Left lower quadrant pain Suprapubic fullness   Genitourinary:  Genitourinary Comments: Gross frank blood per rectum Clots per rectum   Skin: Skin is warm and dry.     ED Treatments / Results    Labs (all labs ordered are listed, but only abnormal results are displayed) Labs Reviewed  C DIFFICILE QUICK SCREEN W PCR REFLEX - Abnormal; Notable for the following:       Result Value   C Diff antigen POSITIVE (*)    All other components within normal limits  COMPREHENSIVE METABOLIC PANEL - Abnormal; Notable for the following:    BUN 21 (*)    Calcium 8.8 (*)    ALT 11 (*)  All other components within normal limits  CBC - Abnormal; Notable for the following:    RBC 3.65 (*)    MCV 101.1 (*)    All other components within normal limits  URINALYSIS, ROUTINE W REFLEX MICROSCOPIC - Abnormal; Notable for the following:    APPearance HAZY (*)    Hgb urine dipstick LARGE (*)    Ketones, ur 5 (*)    Bacteria, UA FEW (*)    Squamous Epithelial / LPF 0-5 (*)    All other components within normal limits  PROTIME-INR - Abnormal; Notable for the following:    Prothrombin Time 16.0 (*)    All other components within normal limits  CBC - Abnormal; Notable for the following:    RBC 3.13 (*)    Hemoglobin 10.3 (*)    HCT 31.4 (*)    MCV 100.3 (*)    All other components within normal limits  BASIC METABOLIC PANEL - Abnormal; Notable for the following:    Chloride 115 (*)    CO2 20 (*)    Glucose, Bld 111 (*)    Calcium 8.0 (*)    All other components within normal limits  CBC - Abnormal; Notable for the following:    RBC 3.52 (*)    Hemoglobin 11.1 (*)    HCT 33.3 (*)    RDW 15.7 (*)    All other components within normal limits  HEMOGLOBIN AND HEMATOCRIT, BLOOD - Abnormal; Notable for the following:    Hemoglobin 10.1 (*)    HCT 30.5 (*)    All other components within normal limits  I-STAT CHEM 8, ED - Abnormal; Notable for the following:    Sodium 146 (*)    BUN 25 (*)    Calcium, Ion 1.13 (*)    Hemoglobin 11.6 (*)    HCT 34.0 (*)    All other components within normal limits  MRSA PCR SCREENING  CLOSTRIDIUM DIFFICILE BY PCR  APTT  HEMOGLOBIN AND HEMATOCRIT, BLOOD   HEMOGLOBIN AND HEMATOCRIT, BLOOD  TYPE AND SCREEN  PREPARE RBC (CROSSMATCH)  PREPARE RBC (CROSSMATCH)  PREPARE RBC (CROSSMATCH)    EKG  EKG Interpretation None       Radiology Nm Gi Blood Loss  Result Date: 03/29/2016 CLINICAL DATA:  Acute lower GI bleed, bright red blood and clots per rectum EXAM: NUCLEAR MEDICINE GASTROINTESTINAL BLEEDING SCAN TECHNIQUE: Sequential abdominal images were obtained following intravenous administration of Tc-63mlabeled red blood cells. RADIOPHARMACEUTICALS:  27.4 mCi Tc-975mn-vitro labeled autologous red cells. COMPARISON:  None FINDINGS: Initial normal blood pool distribution of labeled red cells. Excretion of a small amount of delabeled tracer into urinary bladder over the course of the exam. Beginning at 57 minutes, abnormal intestinal localization of tracer is identified at the sigmoid colon compatible with acute GI bleed. IMPRESSION: Positive GI blood loss exam with site of active bleeding identified at the sigmoid colon. Electronically Signed   By: MaLavonia Dana.D.   On: 03/29/2016 13:26    Procedures Procedures (including critical care time)  Medications Ordered in ED Medications  0.9 %  sodium chloride infusion ( Intravenous Not Given 03/29/16 0613)  0.9 % NaCl with KCl 20 mEq/ L  infusion ( Intravenous Stopped 03/29/16 0513)  0.9 %  sodium chloride infusion ( Intravenous New Bag/Given 03/29/16 0847)  metoprolol (LOPRESSOR) tablet 50 mg (50 mg Oral Given 03/29/16 1051)  sodium chloride 0.9 % bolus 1,000 mL (0 mLs Intravenous Stopped 03/28/16 1116)  pantoprazole (PROTONIX) injection 40  mg (40 mg Intravenous Given 03/28/16 1039)  0.9 %  sodium chloride infusion ( Intravenous Stopped 03/29/16 0400)  white petrolatum (VASELINE) gel (1 application  Given 07/24/41 2135)  0.9 %  sodium chloride infusion ( Intravenous New Bag/Given 03/29/16 1057)  technetium labeled red blood cells (ULTRATAG) injection kit 15.4 millicurie (00.8 millicuries Intravenous  Contrast Given 03/29/16 1150)     Initial Impression / Assessment and Plan / ED Course  I have reviewed the triage vital signs and the nursing notes.  Pertinent labs & imaging results that were available during my care of the patient were reviewed by me and considered in my medical decision making (see chart for details).   Patient presents to the ED after two episodes of bloody bowel movement.  Hemoglobin is stable.  Vitals are stable however patient is on metoprolol and diltiazem.  Patient has atrial fibrillation and not on anticoagulation.  On exam patient has active bleeding per rectum with clots.  She had a bloody bowel movement while in the ED.  Discussed case with hospitalist and agreeable for admission.      Final Clinical Impressions(s) / ED Diagnoses   Final diagnoses:  Acute lower GI bleeding    New Prescriptions Current Discharge Medication List       Valinda Party, DO 03/29/16 [place], DO 03/29/16 1455

## 2016-03-28 NOTE — Progress Notes (Signed)
Received patient from the ED agitated.  ED RN had to clean patient in route to 4E with another bloody stool  Incident.  Called daughter [name] [phone] and she stated that she ws driving and would be here shortly.

## 2016-03-28 NOTE — ED Notes (Signed)
ED Provider at bedside. 

## 2016-03-29 ENCOUNTER — Inpatient Hospital Stay (HOSPITAL_COMMUNITY): Payer: Medicare Other

## 2016-03-29 DIAGNOSIS — Z7982 Long term (current) use of aspirin: Secondary | ICD-10-CM

## 2016-03-29 DIAGNOSIS — Z79899 Other long term (current) drug therapy: Secondary | ICD-10-CM

## 2016-03-29 DIAGNOSIS — I10 Essential (primary) hypertension: Secondary | ICD-10-CM

## 2016-03-29 DIAGNOSIS — K922 Gastrointestinal hemorrhage, unspecified: Secondary | ICD-10-CM

## 2016-03-29 DIAGNOSIS — I4891 Unspecified atrial fibrillation: Secondary | ICD-10-CM

## 2016-03-29 DIAGNOSIS — K921 Melena: Secondary | ICD-10-CM

## 2016-03-29 DIAGNOSIS — Z86010 Personal history of colonic polyps: Secondary | ICD-10-CM

## 2016-03-29 LAB — CBC
HCT: 33.3 % — ABNORMAL LOW (ref 36.0–46.0)
Hemoglobin: 11.1 g/dL — ABNORMAL LOW (ref 12.0–15.0)
MCH: 31.5 pg (ref 26.0–34.0)
MCHC: 33.3 g/dL (ref 30.0–36.0)
MCV: 94.6 fL (ref 78.0–100.0)
PLATELETS: 153 10*3/uL (ref 150–400)
RBC: 3.52 MIL/uL — AB (ref 3.87–5.11)
RDW: 15.7 % — ABNORMAL HIGH (ref 11.5–15.5)
WBC: 10.3 10*3/uL (ref 4.0–10.5)

## 2016-03-29 LAB — BASIC METABOLIC PANEL
Anion gap: 7 (ref 5–15)
BUN: 19 mg/dL (ref 6–20)
CALCIUM: 8 mg/dL — AB (ref 8.9–10.3)
CHLORIDE: 115 mmol/L — AB (ref 101–111)
CO2: 20 mmol/L — ABNORMAL LOW (ref 22–32)
CREATININE: 0.68 mg/dL (ref 0.44–1.00)
Glucose, Bld: 111 mg/dL — ABNORMAL HIGH (ref 65–99)
Potassium: 4 mmol/L (ref 3.5–5.1)
SODIUM: 142 mmol/L (ref 135–145)

## 2016-03-29 LAB — C DIFFICILE QUICK SCREEN W PCR REFLEX
C DIFFICLE (CDIFF) ANTIGEN: POSITIVE — AB
C Diff toxin: NEGATIVE

## 2016-03-29 LAB — HEMOGLOBIN AND HEMATOCRIT, BLOOD
HCT: 30.5 % — ABNORMAL LOW (ref 36.0–46.0)
HCT: 27.6 % — ABNORMAL LOW (ref 36.0–46.0)
HEMATOCRIT: 29.9 % — AB (ref 36.0–46.0)
HEMOGLOBIN: 10 g/dL — AB (ref 12.0–15.0)
Hemoglobin: 10.1 g/dL — ABNORMAL LOW (ref 12.0–15.0)
Hemoglobin: 9.3 g/dL — ABNORMAL LOW (ref 12.0–15.0)

## 2016-03-29 LAB — PREPARE RBC (CROSSMATCH)

## 2016-03-29 LAB — CLOSTRIDIUM DIFFICILE BY PCR: Toxigenic C. Difficile by PCR: NEGATIVE

## 2016-03-29 MED ORDER — VANCOMYCIN 50 MG/ML ORAL SOLUTION
125.0000 mg | Freq: Four times a day (QID) | ORAL | Status: DC
  Filled 2016-03-29: qty 2.5

## 2016-03-29 MED ORDER — IOPAMIDOL (ISOVUE-300) INJECTION 61%
INTRAVENOUS | Status: AC
  Filled 2016-03-29: qty 150

## 2016-03-29 MED ORDER — METOPROLOL TARTRATE 50 MG PO TABS
50.0000 mg | ORAL_TABLET | Freq: Two times a day (BID) | ORAL | Status: DC
  Administered 2016-03-29 – 2016-04-01 (×7): 50 mg via ORAL
  Filled 2016-03-29 (×8): qty 1

## 2016-03-29 MED ORDER — FENTANYL CITRATE (PF) 100 MCG/2ML IJ SOLN
INTRAMUSCULAR | Status: AC
  Filled 2016-03-29: qty 2

## 2016-03-29 MED ORDER — IOPAMIDOL (ISOVUE-300) INJECTION 61%
INTRAVENOUS | Status: AC
  Filled 2016-03-29: qty 100

## 2016-03-29 MED ORDER — TECHNETIUM TC 99M-LABELED RED BLOOD CELLS IV KIT
27.4000 | PACK | Freq: Once | INTRAVENOUS | Status: AC | PRN
  Administered 2016-03-29: 27.4 via INTRAVENOUS

## 2016-03-29 MED ORDER — NALOXONE HCL 0.4 MG/ML IJ SOLN
INTRAMUSCULAR | Status: AC
  Filled 2016-03-29: qty 1

## 2016-03-29 MED ORDER — SODIUM CHLORIDE 0.9 % IV SOLN
Freq: Once | INTRAVENOUS | Status: AC
  Administered 2016-03-29: 11:00:00 via INTRAVENOUS

## 2016-03-29 MED ORDER — SODIUM CHLORIDE 0.9 % IV SOLN
INTRAVENOUS | Status: AC
  Administered 2016-03-29 – 2016-03-30 (×2): via INTRAVENOUS

## 2016-03-29 MED ORDER — MIDAZOLAM HCL 2 MG/2ML IJ SOLN
INTRAMUSCULAR | Status: AC
  Filled 2016-03-29: qty 2

## 2016-03-29 MED ORDER — FLUMAZENIL 0.5 MG/5ML IV SOLN
INTRAVENOUS | Status: AC
  Filled 2016-03-29: qty 5

## 2016-03-29 NOTE — Progress Notes (Signed)
Patient back from Nuclear Med with bloody stool and slightly agitated .  Call to daughter to inform her that patient back in room.  Patient better after cleaned up, lotion applied to legs, feet and socks with SCDs on.  Daughter back in room at 2pm.

## 2016-03-29 NOTE — Consult Note (Signed)
Consultation  Referring Provider:  Internal medicine teaching service Primary Care Physician:  Egbert Garibaldi, NP Primary Gastroenterologist:  Dr.Hung  Reason for Consultation:  Acute lower GI bleed  HPI: [name] is a 83 y.o. female , known previously to Dr. Elnoria Howard who was admitted yesterday through the emergency room after acute onset of GI bleeding with bright red blood per rectum and clots onset early yesterday morning. Patient did not have any associated pain or cramping, no nausea or vomiting. No syncope. On arrival hemoglobin was 12, she drifted to 10.3 last evening and hemoglobin is now 11.1 this morning after 2 units of packed RBCs last night. She has history of chronic atrial fibrillation, had not been on anticoagulation but was taking a 325 mg aspirin daily.  She has no prior history of GI bleeding. She does have history of prior colonoscopy, I believe done in 2007 per Dr. Elnoria Howard at which time she was found to have villous adenomas of the rectum and required a rectosigmoid resection and low anterior anastomosis per Dr. Lurene Shadow in July 2007. Per patient's daughter she has not had colonoscopy since 2007. He has not had any recent complaints of diarrhea or blood in her stools. She had abrupt onset yesterday morning and had several bowel movements through the day yesterday and into this morning. Nurse reports that she continues to bleed this morning, she currently has a small amount of red and clotted blood on the pad. She has been hemodynamically stable however is in rapid A. Fib Patient's daughter does not want her to have to undergo colonoscopy unless absolutely necessary but is agreeable to imaging etc. for control of bleeding.  Patient brought to the previous colonoscopy reports to the hospital with her this morning. Colonoscopy in 2007 per Dr. Elnoria Howard with finding of left-sided diverticulosis and 2 large rectal adenomatous polyps.    Past Medical History:  Diagnosis Date    . Anxiety    "now & then; I get over it"; denies RX  . Arthritis   . Atrial fibrillation ([facility])    08/2005, normal EF at that time  . Blood transfusion    "my own blood"  . Dizziness   . Fracture of femoral neck, right ([facility]) 2010  . Headache(784.0)    "terrible while going thru menopause; none since then"  . History of rectal cancer   . Hypertension   . MVP (mitral valve prolapse)    Not mentioned on 08/2005 echo however  . OP (osteoporosis)   . Pneumonia 05/2013   "once"  . Syncope 06/10/11   "fell; I've been having these spells; off & on; last time was maybe 2 yr ago"  . Tachyarrhythmia 06/11/11   burst of ST  . Wrist fracture    left    Past Surgical History:  Procedure Laterality Date  . BREAST CYST EXCISION     right  . CATARACT EXTRACTION W/ INTRAOCULAR LENS  IMPLANT, BILATERAL    . COLECTOMY    . COLONOSCOPY    . INGUINAL HERNIA REPAIR     right  . TOTAL HIP ARTHROPLASTY     bilaterally  . TRANSTHORACIC ECHOCARDIOGRAM  08/20/2005   EF 60%    Prior to Admission medications   Medication Sig Start Date End Date Taking? Authorizing Provider  acetaminophen (TYLENOL) 500 MG tablet Take 500-1,000 mg by mouth at bedtime.    Yes Historical Provider, MD  aspirin 325 MG tablet Take 1 tablet (325 mg total) by  mouth daily. 11/18/13  Yes Zannie CovePreetha Joseph, MD  CARTIA XT 180 MG 24 hr capsule TAKE 1 CAPSULE(180 MG) BY MOUTH DAILY 12/24/15  Yes Vesta MixerPhilip J Nahser, MD  Cranberry (SM CRANBERRY) 300 MG tablet Take 300 mg by mouth every other day.    Yes Historical Provider, MD  diltiazem (DILACOR XR) 180 MG 24 hr capsule Take 180 mg by mouth daily.   Yes Historical Provider, MD  fluticasone (FLONASE) 50 MCG/ACT nasal spray Place 1 spray into both nostrils daily.   Yes Historical Provider, MD  furosemide (LASIX) 20 MG tablet Take 1 tablet (20 mg total) by mouth as needed (take as needed for incraesed leg swelling, do not use more than once a day). 11/18/13  Yes Zannie CovePreetha Joseph, MD  lactose free  nutrition (BOOST PLUS) LIQD Take 237 mLs by mouth daily at 2 PM daily at 2 PM. 04/08/13  Yes Ripudeep K Rai, MD  losartan (COZAAR) 50 MG tablet Take 1 tablet (50 mg total) by mouth daily. 12/31/15 12/30/16 Yes Vesta MixerPhilip J Nahser, MD  metoprolol (LOPRESSOR) 50 MG tablet TAKE 1 TABLET(50 MG) BY MOUTH TWICE DAILY 12/24/15  Yes Vesta MixerPhilip J Nahser, MD  Multiple Vitamins-Minerals (MULTIVITAMIN GUMMIES WOMENS) CHEW Chew 1 each by mouth daily.    Yes Historical Provider, MD  Naphazoline HCl (CLEAR EYES OP) Place 1 drop into both eyes at bedtime as needed (dry eyes/ itching).   Yes Historical Provider, MD  Pumpkin Seed-Soy Germ (AZO BLADDER CONTROL/GO-LESS PO) Take 1 capsule by mouth at bedtime.   Yes Historical Provider, MD    Current Facility-Administered Medications  Medication Dose Route Frequency Provider Last Rate Last Dose  . 0.9 %  sodium chloride infusion   Intravenous Once [employer] GroupJessica Ratliff Hoffman, DO      . 0.9 %  sodium chloride infusion   Intravenous Continuous Valentino NoseNathan Boswell, MD 75 mL/hr at 03/29/16 0847    . metoprolol (LOPRESSOR) tablet 50 mg  50 mg Oral BID Nyra MarketGorica Svalina, MD        Allergies as of 03/28/2016 - Review Complete 03/28/2016  Allergen Reaction Noted  . Sulfa drugs cross reactors Hives and Other (See Comments) 11/06/2010  . Irbesartan Other (See Comments) 04/05/2013    Family History  Problem Relation Age of Onset  . Heart disease Neg Hx     Social History   Social History  . Marital status: Widowed    Spouse name: N/A  . Number of children: N/A  . Years of education: N/A   Occupational History  . Not on file.   Social History Main Topics  . Smoking status: Never Smoker  . Smokeless tobacco: Never Used  . Alcohol use No  . Drug use: No  . Sexual activity: No   Other Topics Concern  . Not on file   Social History Narrative   [place] Loves to [place].    Review of Systems: Pertinent positive and negative review of systems were noted  in the above HPI section.  All other review of systems was otherwise negative.  Physical Exam: Vital signs in last 24 hours: Temp:  [97.4 F (36.3 C)-99.2 F (37.3 C)] 98.7 F (37.1 C) (02/17 0745) Pulse Rate:  [52-140] 140 (02/17 0745) Resp:  [14-31] 20 (02/17 0745) BP: (92-167)/(56-102) 152/86 (02/17 0745) SpO2:  [95 %-100 %] 97 % (02/17 0745) Weight:  [115 lb 4.8 oz (52.3 kg)] 115 lb 4.8 oz (52.3 kg) (02/16 1331) Last BM Date: 03/29/16 General:   Alert,  Well-developed, well-nourished,Very elderly white female pleasant and cooperative in NAD Head:  Normocephalic and atraumatic. Right facial droop Eyes:  Sclera clear, no icterus.   Conjunctiva pink. Ears:  Normal auditory acuity. Nose:  No deformity, discharge,  or lesions. Mouth:  No deformity or lesions.   Neck:  Supple; no masses or thyromegaly. Lungs:  Clear throughout to auscultation.   No wheezes, crackles, or rhonchi. Heart: Tachycardia irregular  Regular rate and rhythm; no murmurs, clicks, rubs,  or gallops. Abdomen:  Soft,nontender, BS active,nonpalp mass or hsm.   Rectal:  Deferred -dark maroonish clotted blood on the pad Msk:  Symmetrical without gross deformities. . Pulses:  Normal pulses noted. Extremities:  Without clubbing or edema. Neurologic:  Alert and  oriented x4;  grossly normal neurologically. Skin:  Intact without significant lesions or rashes.. Psych:  Alert and cooperative. Normal mood and affect.  Intake/Output from previous day: 02/16 0701 - 02/17 0700 In: 2516.3 [I.V.:846.3; Blood:670; IV Piggyback:1000] Out: 500 [Urine:500] Intake/Output this shift: No intake/output data recorded.  Lab Results:  Recent Labs  03/28/16 0906 03/28/16 0944 03/28/16 1458 03/29/16 0845  WBC 7.4  --  7.6 10.3  HGB 12.0 11.6* 10.3* 11.1*  HCT 36.9 34.0* 31.4* 33.3*  PLT 174  --  176 153   BMET  Recent Labs  03/28/16 0906 03/28/16 0944  NA 143 146*  K 3.7 3.6  CL 110 108  CO2 25  --   GLUCOSE 90 90    BUN 21* 25*  CREATININE 0.67 0.70  CALCIUM 8.8*  --    LFT  Recent Labs  03/28/16 0906  PROT 6.6  ALBUMIN 3.5  AST 23  ALT 11*  ALKPHOS 63  BILITOT 1.0   PT/INR  Recent Labs  03/28/16 1458  LABPROT 16.0*  INR 1.27   Hepatitis Panel No results for input(s): HEPBSAG, HCVAB, HEPAIGM, HEPBIGM in the last 72 hours.    IMPRESSION:  #33  83 year old white female with acute lower GI bleed, painless, onset 24 hours ago. Patient has had continued bleeding, in setting of chronic high-dose aspirin use. She has had previously documented diverticulosis and this is most likely cause of her current acute lower GI bleed. Certainly cannot rule out recurrent colonic lesion given history of large villous adenomatous rectal polyps requiring previous surgical resection. #2 anemia secondary to above #3 chronic atrial fibrillation-rapid rate to this a.m. #4 history of CVA 2015  #5  C. Diff positive today - no hx of diarrhea   Plan; bedrest today, continue IV fluids Serial hemoglobins every 6 hours and transfuse for hemoglobin 9 or less Hold aspirin We'll schedule for stat nuclear medicine bleeding scan and if positive will proceed with IR consult for angiogram and embolization Will Hope to avoid colonoscopy at her very advanced age with increased risk altercations with sedation. Clear liquid diet after bleeding scan if negative Start Vancomycin 125 mg po QID x 14 days for cdiff Would involve cardiology for help with management of rapid A. Fib Thank you we will follow with you, Dr. Elnoria Howard will assume care on Monday  Addendum - error- see Cdiff pcr was neg,Ag +   Does not needs treated, stopped Vancomycin      Nansi Birmingham  03/29/2016, 9:39 AM

## 2016-03-29 NOTE — Progress Notes (Signed)
Patient ID: Amber PressmanRuth T Berry, female   DOB: 1914/11/21, [age] y.o.   MRN: [id number] The patient is referred for angiography and possible embolization. She refused the procedure.

## 2016-03-29 NOTE — Progress Notes (Signed)
   Subjective:  Patient has had decrease in bloody stool ocurrances; this morning had about 3 that per nursing was darker and tarry. Patient endorses one episode of lower abdominal discomfort yesterday afternoon prior to a BM but has not had any since.   She denies chest discomfort, shortness of breath, nausea, headache, dizziness. She states that she is hungry.   Patient endorses wanting to pursue intervention if it is an option.   Objective:  Vital signs in last 24 hours: Vitals:   03/29/16 0424 03/29/16 0500 03/29/16 0600 03/29/16 0745  BP: (!) 142/70 (!) 162/92 (!) 148/84 (!) 152/86  Pulse: 100 (!) 101 (!) 128 (!) 140  Resp: 15 (!) 22 (!) 24 20  Temp: 98.3 F (36.8 C)   98.7 F (37.1 C)  TempSrc: Oral   Oral  SpO2: 97% 96% 97% 97%  Weight:      Height:       Constitutional: NAD, pleasant CV: irregularly irregular rhythm, tachycardic, warm extremities Resp: no increased work of breathing Abd: soft, diminished bowel sounds, NDNT  Assessment/Plan:  Active Problems:   HTN (hypertension)   A-fib ([facility])   GI bleed  GI bleed: Patient with continued bloody bowel movements this morning that is darker in color than at presentation. She received 2U pRBC's last night due to increasing tachycardia and lower BP's. AM Hgb is 11.1.  --GI to see this AM - appreciate their assistance --f/u orthostatics --gentle IVF; NPO  A fib: Patient with A fib on home regimen of ASA, metoprolol and cardizem. Her HR has been trending up slowly and now is consistently in 120-130; she is asymptomatic.  --restart home metoprolol --gentle IVF  HTN: At home on losartan, metoprolol and cardizem. She had some soft for her BP's last night, but has since been consistently with SBP 140-150s.  --restarting home metoprolol for a fib  Dispo: Anticipated discharge in approximately 2-3 day(s).   Nyra MarketGorica Javone Ybanez, MD 03/29/2016, 9:12 AM Pager [phone]

## 2016-03-29 NOTE — Progress Notes (Signed)
Patient transferred to Nuclear med 

## 2016-03-30 DIAGNOSIS — I10 Essential (primary) hypertension: Secondary | ICD-10-CM

## 2016-03-30 LAB — HEMOGLOBIN AND HEMATOCRIT, BLOOD
HCT: 26.1 % — ABNORMAL LOW (ref 36.0–46.0)
HCT: 26.7 % — ABNORMAL LOW (ref 36.0–46.0)
HEMATOCRIT: 26.2 % — AB (ref 36.0–46.0)
HEMATOCRIT: 24.7 % — AB (ref 36.0–46.0)
HEMOGLOBIN: 9 g/dL — AB (ref 12.0–15.0)
HEMOGLOBIN: 8.4 g/dL — AB (ref 12.0–15.0)
Hemoglobin: 8.8 g/dL — ABNORMAL LOW (ref 12.0–15.0)
Hemoglobin: 8.7 g/dL — ABNORMAL LOW (ref 12.0–15.0)

## 2016-03-30 MED ORDER — SODIUM CHLORIDE 0.9 % IV SOLN
INTRAVENOUS | Status: AC
  Administered 2016-03-30: 1000 mL via INTRAVENOUS
  Administered 2016-03-30: 08:00:00 via INTRAVENOUS

## 2016-03-30 NOTE — Progress Notes (Signed)
Internal Medicine Attending  Date: 03/30/2016  Patient name: Amber PressmanRuth T Berry Medical record number: [id number] Date of birth: 12-26-1914 Age: [age] y.o. Gender: female  I saw and evaluated the patient. I reviewed the resident's note by Dr. Karma GreaserBoswell and I agree with the resident's findings and plans as documented in his progress note.  When seen on rounds this afternoon [name] was without acute complaints. She stated that she was still having bright red blood per rectum after this morning but the number and volume was much reduced compared to yesterday. Vitals are notable for an improved blood pressure and a heart rate that just hit 103. Physical exam revealed a soft, nontender abdomen. Her hemoglobin has been stable over the last 12 hours at 8.7 with no additional blood products required. Given her history, presentation, and clinical course her sigmoid bleed is most likely related to diverticulosis and it appears to be slowing down. We will continue with IV access, an active type and screen, and serial CBCs. We are continuing to hold her aspirin therapy. I suspect she will be ready for discharge home if she remains stable 24 hours after her last bloody bowel movement.

## 2016-03-30 NOTE — Progress Notes (Signed)
Patient ID: [name], female   DOB: [birth year], [age] y.o.   MRN: [id number]    Progress Note   Subjective   Feels better- no complaints ,just weak. Pt says no further BM's this am -stopped last night HGB 8.8 Bleeding scan was positive in sigmoid colon but pt declined IR intervention   Objective   Vital signs in last 24 hours: Temp:  [97.3 F (36.3 C)-98.7 F (37.1 C)] 97.5 F (36.4 C) (02/18 0800) Pulse Rate:  [78-159] 92 (02/18 0800) Resp:  [15-31] 24 (02/18 0800) BP: (98-135)/(55-87) 125/78 (02/18 0800) SpO2:  [93 %-99 %] 95 % (02/18 0800) Last BM Date: 03/30/16 General: elderly    white female in NAD, daughter at bedside Heart: ir Regular rate and rhythm; no murmurs Lungs: Respirations even and unlabored, lungs CTA bilaterally Abdomen:  Soft, nontender and nondistended. Normal bowel sounds. Extremities:  Without edema. Neurologic:  Alert and oriented,  grossly normal neurologically. Psych:  Cooperative. Normal mood and affect.  Intake/Output from previous day: 02/17 0701 - 02/18 0700 In: 1786.3 [P.O.:120; I.V.:1666.3] Out: 600 [Urine:600] Intake/Output this shift: No intake/output data recorded.  Lab Results:  Recent Labs  03/28/16 0906  03/28/16 1458 03/29/16 0845  03/29/16 1532 03/29/16 2203 03/30/16 0343  WBC 7.4  --  7.6 10.3  --   --   --   --   HGB 12.0  < > 10.3* 11.1*  < > 10.0* 9.3* 8.8*  HCT 36.9  < > 31.4* 33.3*  < > 29.9* 27.6* 26.1*  PLT 174  --  176 153  --   --   --   --   < > = values in this interval not displayed. BMET  Recent Labs  03/28/16 0906 03/28/16 0944 03/29/16 0845  NA 143 146* 142  K 3.7 3.6 4.0  CL 110 108 115*  CO2 25  --  20*  GLUCOSE 90 90 111*  BUN 21* 25* 19  CREATININE 0.67 0.70 0.68  CALCIUM 8.8*  --  8.0*   LFT  Recent Labs  03/28/16 0906  PROT 6.6  ALBUMIN 3.5  AST 23  ALT 11*  ALKPHOS 63  BILITOT 1.0   PT/INR  Recent Labs  03/28/16 1458  LABPROT 16.0*  INR 1.27    Studies/Results: Nm  Gi Blood Loss  Result Date: 03/29/2016 CLINICAL DATA:  Acute lower GI bleed, bright red blood and clots per rectum EXAM: NUCLEAR MEDICINE GASTROINTESTINAL BLEEDING SCAN TECHNIQUE: Sequential abdominal images were obtained following intravenous administration of Tc-7714m labeled red blood cells. RADIOPHARMACEUTICALS:  27.4 mCi Tc-5014m in-vitro labeled autologous red cells. COMPARISON:  None FINDINGS: Initial normal blood pool distribution of labeled red cells. Excretion of a small amount of delabeled tracer into urinary bladder over the course of the exam. Beginning at 57 minutes, abnormal intestinal localization of tracer is identified at the sigmoid colon compatible with acute GI bleed. IMPRESSION: Positive GI blood loss exam with site of active bleeding identified at the sigmoid colon. Electronically Signed   By: Ulyses SouthwardMark  Boles M.D.   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

## 2016-03-30 NOTE — Plan of Care (Signed)
Problem: Education: Goal: Knowledge of [facility] Education information/materials will improve Outcome: Progressing D/W patient about how we are helping her. She is tearful at times and requires reorienting.   Problem: Safety: Goal: Ability to remain free from injury will improve Outcome: Progressing Bed alarm utilized. Patient demonstrating some impulsive behavior.   Problem: Health Behavior/Discharge Planning: Goal: Ability to manage health-related needs will improve Outcome: Progressing Patient did not require blood overnight; however, her HGB now in 8 range. Her BM's were 4 instead of the 6-8 from day shift.   Problem: Nutrition: Goal: Adequate nutrition will be maintained Outcome: Progressing Able to start patient on clear liquid diet. She was very happy to have some apple juice.   Problem: Bowel/Gastric: Goal: Will not experience complications related to bowel motility Outcome: Progressing Patient still having tarry BM's; although the quantity and amount was decreased from previous shift.

## 2016-03-30 NOTE — Progress Notes (Signed)
Subjective: [name] is doing well this morning. She reports no further bowel movements overnight however nursing notes indicate 4 bowel movements overnight. Day staff nursing reports to me that she had two bowel movements overnight with one blood but unable to report if it was bright red blood or melena as this was not signed out at shift change. No further bowel movements this morning. She denies any chest pain, shortness of breath, dizziness. She reports that she would not like any further procedures or imaging.   Objective: Vital signs in last 24 hours: Vitals:   03/29/16 2300 03/30/16 0000 03/30/16 0100 03/30/16 0500  BP: 119/80 110/69 102/62 135/68  Pulse: (!) 140 91 83 90  Resp: (!) 23 (!) 31 (!) 24 19  Temp:    97.5 F (36.4 C)  TempSrc:    Oral  SpO2: 98% 97% 97% 98%  Weight:      Height:       Weight change:   Intake/Output Summary (Last 24 hours) at 03/30/16 1610 Last data filed at 03/30/16 0400  Gross per 24 hour  Intake          1561.25 ml  Output              600 ml  Net           961.25 ml    Physical Exam  Constitutional:  Pleasant elderly female resting in bed in no acute distress  Cardiovascular:  irregularly irregular, tahcycardic  Pulmonary/Chest: Effort normal and breath sounds normal.  Abdominal: Soft. She exhibits no distension. There is no tenderness.  Diminished bowel sounds  Nursing note and vitals reviewed.  Medications: I have reviewed the patient's current medications. Scheduled Meds: . sodium chloride   Intravenous Once  . metoprolol  50 mg Oral BID   Continuous Infusions: . sodium chloride 75 mL/hr at 03/30/16 0540   PRN Meds:. Assessment/Plan:  Lower GI bleed: Underwent tagged RBC scan yesterday. Discussed with patient and she voiced that she would like to know the cause of the bleed to make a more informed decision on how aggressive she would like to be for treatment. Also discussed with IR prior to procedure given her advanced  age and were agreeable to doing the procedure if clinically indicated. Scan showed active bleeding identified at the sigmoid colon; likely representing bleeding of her known sigmoid colon diverticulosis (2007 colonoscopy). IR was consulted and after discussion of the procedure she declined to proceed. Hemodynamically she has been stable, maintaining systolic blood pressures in the 110s (though was in the 150s on presentation with history of hypertension). She has been in A.Fib with rates up to the 140s last night, however rates have been better controlled now and have been in the 80-90s since midnight. Latest BP was 126/70. Orthostatic vitals not performed yet his morning. Unclear stool output overnight but appears to be slowing. Hemoglobin continues to trend down 11.1 post transfusion yesterday am >> 8.8 this morning. Will continue to monitor closely; given likely diverticular bleed will likely resolve spontaneously with supportive care. Appreciate GI's assistance in this case.  -Trend H/H and monitor for bleeding; transfusion goal Hgb < 8 as long as hemodynamically stable -Daily orthostatics  -Continue gentle IVF   A fib: Patient with A fib on home regimen of ASA, metoprolol and cardizem. Was in a.fib with RVR yesterday and restarted home metoprolol. Rates better controlled overnight in the 80-90s. Remains in a.fib. -Metoprolol 50 mg bid -Gentle IVF  HTN: Holding  home antihypertensives given GI bleed. Continue metoprolol as above.   Dispo: Disposition is deferred at this time, awaiting improvement of current medical problems.    The patient does have a current PCP [name](Kimberly Millsaps, NP) and [facility] after discharge.  The patient does not have transportation limitations that hinder transportation to clinic appointments.  .Services Needed at time of discharge: Y = Yes, Blank = No PT:   OT:   RN:   Equipment:   Other:     LOS: 2 days   Valentino NoseNathan  Keauna Brasel, MD IMTS PGY-2 [phone] 03/30/2016, 7:18 AM

## 2016-03-30 NOTE — Progress Notes (Signed)
Patient continued to have 6-8 "red" bowel movements with some clots.  Patient in Nuclear Med approximately 2 hrs arriving back to room exhausted, disoriented to plan of care, and where was her daughter [name].  Called Daughter and she arrived shortly afterwards.  IR called for patient procedure approximately 4pm.  Patient and daughter returned approximately 4:45pm stating patient refused once Dr explained procedure to her.  Continual support given to patient and daughter.  Daughter departed around 5:45pm patient resting well and in better spirits lying on her right side with now bowel movement.  Approximately 6pm patient found at the end of the bed with red bowel movement.  Patient now cleaned with bed alarms on.

## 2016-03-31 LAB — PREPARE RBC (CROSSMATCH)

## 2016-03-31 LAB — HEMOGLOBIN AND HEMATOCRIT, BLOOD
HCT: 24.7 % — ABNORMAL LOW (ref 36.0–46.0)
HEMATOCRIT: 24.1 % — AB (ref 36.0–46.0)
HEMATOCRIT: 25.7 % — AB (ref 36.0–46.0)
HEMOGLOBIN: 8.1 g/dL — AB (ref 12.0–15.0)
HEMOGLOBIN: 8.5 g/dL — AB (ref 12.0–15.0)
HEMOGLOBIN: 8.3 g/dL — AB (ref 12.0–15.0)

## 2016-03-31 MED ORDER — METOPROLOL TARTRATE 5 MG/5ML IV SOLN
5.0000 mg | Freq: Once | INTRAVENOUS | Status: AC
  Administered 2016-04-01: 5 mg via INTRAVENOUS
  Filled 2016-03-31: qty 5

## 2016-03-31 MED ORDER — SODIUM CHLORIDE 0.9 % IV SOLN
Freq: Once | INTRAVENOUS | Status: AC
  Administered 2016-03-31: 09:00:00 via INTRAVENOUS

## 2016-03-31 NOTE — Progress Notes (Addendum)
Patient began HR/Pulse in 130s-150s. Text paged MD to notify per nursing orders. Patient was given Metoprolol and has no PRN meds.  MD returned page, is aware. Patient HR has stabilized by 10:13

## 2016-03-31 NOTE — Progress Notes (Signed)
Subjective: No complaints of hematochezia.  Objective: Vital signs in last 24 hours: Temp:  [97.2 F (36.2 C)-98.5 F (36.9 C)] 97.7 F (36.5 C) (02/19 1500) Pulse Rate:  [60-141] 60 (02/19 1200) Resp:  [13-23] 17 (02/19 1200) BP: (94-154)/(40-97) 154/97 (02/19 1200) SpO2:  [96 %-100 %] 100 % (02/19 1200) Last BM Date: 03/31/16  Intake/Output from previous day: 02/18 0701 - 02/19 0700 In: 2305 [P.O.:580; I.V.:1725] Out: 400 [Urine:400] Intake/Output this shift: No intake/output data recorded.  General appearance: alert and no distress GI: soft, non-tender; bowel sounds normal; no masses,  no organomegaly  Lab Results:  Recent Labs  03/29/16 0845  03/31/16 0325 03/31/16 1114 03/31/16 1618  WBC 10.3  --   --   --   --   HGB 11.1*  < > 8.1* 8.5* 8.3*  HCT 33.3*  < > 24.1* 25.7* 24.7*  PLT 153  --   --   --   --   < > = values in this interval not displayed. BMET  Recent Labs  03/29/16 0845  NA 142  K 4.0  CL 115*  CO2 20*  GLUCOSE 111*  BUN 19  CREATININE 0.68  CALCIUM 8.0*   LFT No results for input(s): PROT, ALBUMIN, AST, ALT, ALKPHOS, BILITOT, BILIDIR, IBILI in the last 72 hours. PT/INR No results for input(s): LABPROT, INR in the last 72 hours. Hepatitis Panel No results for input(s): HEPBSAG, HCVAB, HEPAIGM, HEPBIGM in the last 72 hours. C-Diff No results for input(s): CDIFFTOX in the last 72 hours. Fecal Lactopherrin No results for input(s): FECLLACTOFRN in the last 72 hours.  Studies/Results: No results found.  Medications:  Scheduled: . sodium chloride   Intravenous Once  . metoprolol  50 mg Oral BID   Continuous:   Assessment/Plan: 1) Hematochezia. 2) Diverticula. 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   It is likely that she had a diverticular bleed.  She was called from the office for a follow up a number of years ago, but she declined.  She declines again, that is fine.  She is of sound  mind and judgement.  Plan: 1) Anticipate D/C in the AM. 2) No need for follow up as she does not feel that this is required.  LOS: 3 days   Jakyrah Holladay D 03/31/2016, 7:09 PM

## 2016-03-31 NOTE — Care Management Note (Signed)
Case Management Note  Patient Details  Name: [name] MRN: [id number] Date of Birth: 1914-08-18  Subjective/Objective:  Presents with GIB, afib - controlled, and HTN, she lives with her daughter, she is on full liq diet will advance as tolerated. Most likely dc tomorrow, she has medication coverage and a pcp and transportation at dc.  NCM will cont to follow for dc needs.                   Action/Plan:   Expected Discharge Date:  03/31/16               Expected Discharge Plan:  Home w Home Health Services  In-House Referral:     Discharge planning Services  CM Consult  Post Acute Care Choice:    Choice offered to:     DME Arranged:    DME Agency:     HH Arranged:    HH Agency:     Status of Service:  In process, will continue to follow  If discussed at Long Length of Stay Meetings, dates discussed:    Additional Comments:  [name], [name], RN 03/31/2016, 5:43 PM

## 2016-03-31 NOTE — Progress Notes (Signed)
.   Subjective: Patient doing well this morning. She is sleeping in bed and did not want to talk. She denies any chest pain but does report feeling dizzy at times just laying down. Does also note some mild shortness of breath but overall feeling well. Nursing staff paged about HR in the 130-150s. She received her morning dose of metoprolol with resolution and HR improved to the 90s. She reported palpations during that time but was otherwise asymptomatic. She reports one bowel movement yesterday that was non-bloody. Nursing note indicates 3 bowel movements overnight that were dark brown with no obvious blood.   Objective: Vital signs in last 24 hours: Vitals:   03/31/16 0600 03/31/16 0700 03/31/16 0800 03/31/16 0900  BP: (!) 136/59 134/74 139/67   Pulse: 77 91 94 (!) 141  Resp: 18 13 14  (!) 21  Temp:  98.2 F (36.8 C)    TempSrc:  Axillary    SpO2: 99% 98% 99% 100%  Weight:      Height:       Weight change:   Intake/Output Summary (Last 24 hours) at 03/31/16 1009 Last data filed at 03/31/16 [id number]  Gross per 24 hour  Intake             2165 ml  Output              550 ml  Net             1615 ml    Physical Exam  Constitutional:  Thin elderly female resting comfortably in bed in no acute distress  Cardiovascular:  Irregularly irregular, mildly tachycardic  Pulmonary/Chest: Effort normal and breath sounds normal.  Abdominal: Soft. Bowel sounds are normal. She exhibits no distension. There is no tenderness.   Medications: I have reviewed the patient's current medications. Scheduled Meds: . sodium chloride   Intravenous Once  . metoprolol  50 mg Oral BID   Continuous Infusions: PRN Meds:. Assessment/Plan:  Lower GI bleed: Most likely sigmoid colon diverticular bleed. Appears to be slowing. Does not appear to have had any further bloody bowel movements since 2/17. She has remained hemodynamically stable with blood pressures overnight. Hgb has been slowly down trending; 9.0 >  8.1 this morning. Transfusion goal < 8. If no further evidence of bleeding today and Hgb stabilizes; potential discharge home tomorrow. Appreciate GI assistance in this case.  -Trend H/H and monitor for bleeding; transfusion goal Hgb < 8 as long as hemodynamically stable -Daily orthostatics  -Continue gentle IVF  -Full liquid diet > advance as tolerated  A fib: Patient remains in A Fib. Home regimen of ASA, metoprolol and cardizem. Rates have been well controlled since resumption of metoprolol. Had episode of rates in the 130-150s this morning which resolved after her morning dose of metoprolol. If rates uncontrolled will resume cardizem.  -Metoprolol 50 mg bid -Gentle IVF  HTN: BP stable.  Holding home antihypertensives given GI bleed. Continue metoprolol as above.   Dispo: Disposition is deferred at this time, awaiting improvement of current medical problems.  Anticipated discharge in approximately 1 day(s).   The patient does have a current PCP Marva Panda(Kimberly Millsaps, NP) and [facility].  The patient does not have transportation limitations that hinder transportation to clinic appointments.  .Services Needed at time of discharge: Y = Yes, Blank = No PT:   OT:   RN:   Equipment:   Other:     LOS: 3 days  Valentino Nose, MD IMTS PGY-2 [phone] 03/31/2016, 10:09 AM

## 2016-03-31 NOTE — Progress Notes (Signed)
Medicine attending: I personally examined this patient today together with resident physician Dr. Valentino NoseNathan Boswell and I concur with his evaluation and management plan which we discussed together.  83 year old woman admitted on February 16 with acute onset of painless hematochezia. Initial hemoglobin 12 g with baseline 14 g in October 2015. Hemoglobin has fallen to a nadir of 8.1. Bleeding has stopped. Blood pressure is stable and she is not tachycardic. This is a presumed diverticular bleed. Most recent colonoscopy in 2007 with sigmoid diverticulosis. A tubulo-villous adenoma was resected from the rectum. No high-grade dysplasia or invasive malignancy was identified on the initial biopsy. Subsequent resection of the rectosigmoid colon showed revealed 2 villous adenomas with foci of high-grade dysplasia but no invasion. Multiple lymph nodes negative. A radioactive red blood cell scan did localize bleeding to the sigmoid colon. She was seen recently by cardiology for chronic atrial fibrillation and cardiac status was felt to be stable. She was not put on anticoagulation due to increased fall risk. Impression: Diverticular hemorrhage versus recurrent neoplasm Active bleeding has stopped. We are waiting for final recommendations by gastroenterology but I think this lady merits a follow-up sigmoidoscopy.

## 2016-03-31 NOTE — Plan of Care (Signed)
Problem: Nutrition: Goal: Adequate nutrition will be maintained Outcome: Progressing Patient very anxious to have a real diet. Time spent educating patient that she needs to let her bowel heal before she starts eating a normal diet. She feels as tho she hasn't eaten in 4 days.   Problem: Bowel/Gastric: Goal: Will not experience complications related to bowel motility Outcome: Progressing Patient had 3 bowel movements on night shift. Stools is dark brown with no obvious blood noted.

## 2016-03-31 NOTE — Plan of Care (Deleted)
Problem: Education: Goal: Knowledge of [facility] Education information/materials will improve Outcome: Progressing Patient continues to need frequent reminders, reorientation, and teaching about condition. Patient still having aphasia and confused at times.   Problem: Fluid Volume: Goal: Ability to maintain a balanced intake and output will improve Outcome: Progressing Patient wt stable at 58.8 kg after lasix; states her "feet are little again." Patient due to have foley catheter removed 2/19- patient requests to pull 2-3 hours after lasix dose.

## 2016-04-01 LAB — TYPE AND SCREEN
BLOOD PRODUCT EXPIRATION DATE: 201803142359
BLOOD PRODUCT EXPIRATION DATE: 201803172359
BLOOD PRODUCT EXPIRATION DATE: 201803172359
Blood Product Expiration Date: 201803142359
ISSUE DATE / TIME: 201802162056
ISSUE DATE / TIME: 201802170039
UNIT TYPE AND RH: 5100
UNIT TYPE AND RH: 5100
Unit Type and Rh: 5100
Unit Type and Rh: 5100

## 2016-04-01 LAB — HEMOGLOBIN AND HEMATOCRIT, BLOOD
HCT: 27.9 % — ABNORMAL LOW (ref 36.0–46.0)
HEMOGLOBIN: 9.3 g/dL — AB (ref 12.0–15.0)

## 2016-04-01 MED ORDER — DILTIAZEM HCL ER COATED BEADS 180 MG PO CP24
180.0000 mg | ORAL_CAPSULE | Freq: Every day | ORAL | Status: DC
  Administered 2016-04-01: 180 mg via ORAL
  Filled 2016-04-01: qty 1

## 2016-04-01 MED ORDER — FERROUS SULFATE 325 (65 FE) MG PO TABS: 325.0000 mg | ORAL_TABLET | Freq: Every day | ORAL | Status: DC

## 2016-04-01 MED ORDER — DILTIAZEM HCL ER 180 MG PO CP24: 180.0000 mg | ORAL_CAPSULE | Freq: Every day | ORAL | Status: DC

## 2016-04-01 MED ORDER — SODIUM CHLORIDE 0.9 % IV SOLN
510.0000 mg | Freq: Once | INTRAVENOUS | Status: AC
  Administered 2016-04-01: 510 mg via INTRAVENOUS
  Filled 2016-04-01 (×2): qty 17

## 2016-04-01 MED ORDER — HYDRALAZINE HCL 10 MG PO TABS
10.0000 mg | ORAL_TABLET | Freq: Once | ORAL | Status: AC
  Administered 2016-04-01: 10 mg via ORAL
  Filled 2016-04-01: qty 1

## 2016-04-01 MED ORDER — FERROUS SULFATE 325 (65 FE) MG PO TABS: 325.0000 mg | ORAL_TABLET | Freq: Every day | ORAL | 3 refills | Status: AC

## 2016-04-01 NOTE — Discharge Summary (Signed)
Name: Amber PressmanRuth T Berry MRN: [id number] DOB: 17-Sep-1914 83 y.o. PCP: Marva PandaKimberly Millsaps, Berry  Date of Admission: 03/28/2016  8:30 AM Date of Discharge: 04/01/2016 Attending Physician: Levert FeinsteinJames M Granfortuna, MD  Discharge Diagnosis: 1. Lower GI bleed  Active Problems:   HTN (hypertension)   A-fib (HCC)   GI bleed   Acute lower GI bleeding   Atrial fibrillation with rapid ventricular response (HCC)   Hematochezia   Essential hypertension   Discharge Medications: Allergies as of 04/01/2016      Reactions   Sulfa Drugs Cross Reactors Hives, Other (See Comments)   Almost passed out   Irbesartan Other (See Comments)   Caused heart to race      Medication List    STOP taking these medications   aspirin 325 MG tablet   CARTIA XT 180 MG 24 hr capsule Generic drug:  diltiazem   furosemide 20 MG tablet Commonly known as:  LASIX     TAKE these medications   acetaminophen 500 MG tablet Commonly known as:  TYLENOL Take 500-1,000 mg by mouth at bedtime.   AZO BLADDER CONTROL/GO-LESS PO Take 1 capsule by mouth at bedtime.   CLEAR EYES OP Place 1 drop into both eyes at bedtime as needed (dry eyes/ itching).   diltiazem 180 MG 24 hr capsule Commonly known as:  DILACOR XR Take 180 mg by mouth daily.   ferrous sulfate 325 (65 FE) MG tablet Take 1 tablet (325 mg total) by mouth daily with breakfast. Start taking on:  04/02/2016   fluticasone 50 MCG/ACT nasal spray Commonly known as:  FLONASE Place 1 spray into both nostrils daily.   lactose free nutrition Liqd Take 237 mLs by mouth daily at 2 PM daily at 2 PM.   losartan 50 MG tablet Commonly known as:  COZAAR Take 1 tablet (50 mg total) by mouth daily.   metoprolol 50 MG tablet Commonly known as:  LOPRESSOR TAKE 1 TABLET(50 MG) BY MOUTH TWICE DAILY   MULTIVITAMIN GUMMIES WOMENS Chew Chew 1 each by mouth daily.   SM CRANBERRY 300 MG tablet Generic drug:  Cranberry Take 300 mg by mouth every other day.        Disposition and follow-up:   [name] [facility].  At the hospital follow up visit please address:  1.  Lower GI bleed: Likely 2/2 sigmoid colon diverticulosis. Hgb 9.3 at time of discharge. Please assess for any further bleeding. Started on PO Fe. Please check CBC.   2.  Labs / imaging needed at time of follow-up: CBC  3.  Pending labs/ test needing follow-up: None  Follow-up Appointments: Follow-up Information    [name]. Schedule an appointment as soon as possible for a visit in 1 week(s).   Contact information: [facility] [address] [place] [id number] [phone]           Hospital Course by problem list:  Lower GI bleed: Ms. Laural Berry presented with sudden onset bright red blood per rectum with clots. She underwent a tagged red blood cell scan which localized the bleeding to her sigmoid colon (known sigmoid diverticulosis on previous colonoscopy). IR was consulted but patient declined any procedures. Her Hgb dropped from 12 on admission to 8.1. She received 2 units PRBC. Hgb stabilized and was 9.3 at discharge. She remained hemodynamically stable and was without any further hematochezia for >48 hours prior to discharge. She was given  IV Iron prior to discharge and started on PO Iron pills on discharge. Held her ASA at discharge. Will need follow up with PCP at discharge. Declined follow up with GI.   Atrial Fibrillation: She wen tin to RVR with the acute blood loss. Rates controlled on home dose metoprolol. Resumed home dose metoprolol and Cardizem at discharge.    HTN: Hemodynamically stable during hospitalization. Resumed home medications at discharge.   Discharge Vitals:   BP (!) 170/90 (BP Location: Left Arm)   Pulse 93   Temp 97.7 F (36.5 C) (Oral)   Resp (!) 26   Ht 5\' 3"  (1.6 m) Comment: 53  Wt 115 lb 4.8 oz (52.3 kg)   SpO2 100%   BMI 20.42  kg/m   Pertinent Labs, Studies, and Procedures:   EXAM: NUCLEAR MEDICINE GASTROINTESTINAL BLEEDING SCAN  TECHNIQUE: Sequential abdominal images were obtained following intravenous administration of Tc-13m labeled red blood cells.  RADIOPHARMACEUTICALS:  27.4 mCi Tc-75m in-vitro labeled autologous red cells.  COMPARISON:  None  FINDINGS: Initial normal blood pool distribution of labeled red cells.  Excretion of a small amount of delabeled tracer into urinary bladder over the course of the exam.  Beginning at 57 minutes, abnormal intestinal localization of tracer is identified at the sigmoid colon compatible with acute GI bleed.  IMPRESSION: Positive GI blood loss exam with site of active bleeding identified at the sigmoid colon.  Discharge Instructions: Discharge Instructions    Call MD for:  difficulty breathing, headache or visual disturbances    Complete by:  As directed    Call MD for:  persistant dizziness or light-headedness    Complete by:  As directed    Increase activity slowly    Complete by:  As directed       Signed: Valentino Nose, MD 04/01/2016, 11:08 AM   Pager: [phone]

## 2016-04-01 NOTE — Progress Notes (Addendum)
0315: Patient self discontinued tele. Attempted to re-connect, patient refused. Pt is assessment unchanged no decline in orientation does not want " to be watched". notfied provider. Anxiety? Also requested B/p limits  0423: no page back. second request for PRN bp medications for systolic >180/ x3. 0531: third page out to night time coverage IM residents. For BP >180/>140.  0538: provider returned page, 10mg  hydralazine now and early administration of scheduled 10am metoprolol now. This RN to enter order.

## 2016-04-01 NOTE — Progress Notes (Signed)
   Subjective:  [name] is doing well this morning. She has no complaints. Denies any further bloody or dark colored bowel movements. Denies any chest pain, palpitations, lightheadedness/dizziness, shortness of breath. Reports she would like to go home today.   Objective:  Vital signs in last 24 hours: Vitals:   04/01/16 0700 04/01/16 0739 04/01/16 0800 04/01/16 0826  BP:  (!) 172/101 (!) 170/90   Pulse: (!) 109  98 93  Resp: 19  (!) 24 (!) 26  Temp:    97.7 F (36.5 C)  TempSrc:    Oral  SpO2: 98%  98% 100%  Weight:      Height:       Physical Exam  Constitutional: She is well-developed, well-nourished, and in no distress.  Cardiovascular:  Irregularly irregular rhythm, tachycardic  Pulmonary/Chest: Effort normal and breath sounds normal.  Abdominal: Soft. Bowel sounds are normal. She exhibits no distension. There is no tenderness.  Skin: Skin is warm and dry.  Nursing note and vitals reviewed.  Assessment/Plan: Lower GI bleed: Likely 2/2 sigmoid diverticular bleed. Appears to have stopped with no further bloody bowel movements > 48 hours. Vital signs have been stable and Hgb is improved today at 9.3 today. She declined any GI follow up at discharge or any further procedures. Will give IV iron today and start her on PO iron at discharge. Will need follow up with PCP after discharge.  -Full diet -IV Iron infusion today, start PO iron at discharge -Close follow up with PCP with repeat CBC  A fib: Patient refused metoprolol overnight due to disagreement with nursing staff with increase in heart rate (still in a fib) and blood pressures. Received a dose of IV hydralazine with some improvement in BP. Overall she is stable hemodynamically. Will resume home metoprolol and Cardizem today.  -metoprolol & Cardizem  HTN: BP elevated today 2/2 refusing medications. Resumption of metoprolol and Cardizem as above.   Dispo: Anticipated discharge later today  Valentino NoseNathan Carlise Stofer,  MD 04/01/2016, 8:54 AM Pager: [phone]

## 2016-04-01 NOTE — Progress Notes (Signed)
Medicine attending: I examined this patient together with resident physician Dr. Valentino NoseNathan Boswell and I concur with his evaluation and management plan which we discussed together. No active hematochezia now for over 48 hours.  Hemoglobin stable and increased at 9.3 g this morning. We appreciate GI follow-up The patient declines any endoscopy procedures. Impression: 1.  Likely diverticular bleed 2.  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

## 2016-04-01 NOTE — Discharge Instructions (Addendum)
[name],  You had a bleed in your colon from your diverticulosis. These kinds of bleeds occur on and off but has resolved now. Your blood counts are stable and all of your vital signs are doing great. You did lose a good amount of blood with the bleeding so we are going to start you on an Iron pill to help your body make more blood to replace what you lost. It will be important for you to follow up with your primary care doctor in about a week to make sure everything is doing well and to check on your blood counts.   Please stop taking the Aspirin until you are able to see your primary care physician next week.   If you experience more bleeding please come back to the emergency room for further evaluation.    Diverticulosis Diverticulosis is the condition that develops when small pouches (diverticula) form in the wall of your colon. Your colon, or large intestine, is where water is absorbed and stool is formed. The pouches form when the inside layer of your colon pushes through weak spots in the outer layers of your colon. CAUSES  No one knows exactly what causes diverticulosis. RISK FACTORS  Being older than 50. Your risk for this condition increases with age. Diverticulosis is rare in people younger than 40 years. By age 81, almost everyone has it.  Eating a low-fiber diet.  Being frequently constipated.  Being overweight.  Not getting enough exercise.  Smoking.  Taking over-the-counter pain medicines, like aspirin and ibuprofen. SYMPTOMS  Most people with diverticulosis do not have symptoms. DIAGNOSIS  Because diverticulosis often has no symptoms, health care providers often discover the condition during an exam for other colon problems. In many cases, a health care provider will diagnose diverticulosis while using a flexible scope to examine the colon (colonoscopy). TREATMENT  If you have never developed an infection related to diverticulosis, you may not need treatment. If  you have had an infection before, treatment may include:  Eating more fruits, vegetables, and grains.  Taking a fiber supplement.  Taking a live bacteria supplement (probiotic).  Taking medicine to relax your colon. HOME CARE INSTRUCTIONS   Drink at least 6-8 glasses of water each day to prevent constipation.  Try not to strain when you have a bowel movement.  Keep all follow-up appointments. If you have had an infection before:  Increase the fiber in your diet as directed by your health care provider or dietitian.  Take a dietary fiber supplement if your health care provider approves.  Only take medicines as directed by your health care provider. SEEK MEDICAL CARE IF:   You have abdominal pain.  You have bloating.  You have cramps.  You have not gone to the bathroom in 3 days. SEEK IMMEDIATE MEDICAL CARE IF:   Your pain gets worse.  Yourbloating becomes very bad.  You have a fever or chills, and your symptoms suddenly get worse.  You begin vomiting.  You have bowel movements that are bloody or black. MAKE SURE YOU:  Understand these instructions.  Will watch your condition.  Will get help right away if you are not doing well or get worse. This information is not intended to replace advice given to you by your health care provider. Make sure you discuss any questions you have with your health care provider. Document Released: 10/25/2003 Document Revised: 02/01/2013 Document Reviewed: 12/22/2012 Elsevier Interactive Patient Education  2017 [employer].    Lower Gastrointestinal  Bleeding Lower gastrointestinal (GI) bleeding is the result of bleeding from the colon, rectum, or anal area. The colon is the last part of the digestive tract, where stool, also called feces, is formed. If you have lower GI bleeding, you may see blood in or on your stool. It may be bright red. Lower GI bleeding often stops without treatment. Continued or heavy bleeding needs  emergency treatment at the hospital. What are the causes? Lower GI bleeding may be caused by:  A condition that causes pouches to form in the colon over time (diverticulosis).  Swelling and irritation (inflammation) in areas with diverticulosis (diverticulitis).  Inflammation of the colon (inflammatory bowel disease).  Swollen veins in the rectum (hemorrhoids).  Painful tears in the anus (anal fissures), often caused by passing hard stools.  Cancer of the colon or rectum.  Noncancerous growths (polyps) of the colon or rectum.  A bleeding disorder that impairs the formation of blood clots and causes easy bleeding (coagulopathy).  An abnormal weakening of a blood vessel where an artery and a vein come together (arteriovenous malformation). What increases the risk? You are more likely to develop this condition if:  You are older than 81 years of age.  You take aspirin or NSAIDs on a regular basis.  You take anticoagulant or antiplatelet drugs.  You have a history of high-dose X-ray treatment (radiation therapy) of the colon.  You recently had a colon polyp removed. What are the signs or symptoms? Symptoms of this condition include:  Bright red blood or blood clots coming from your rectum.  Bloody stools.  Black or maroon-colored stools.  Pain or cramping in the abdomen.  Weakness or dizziness.  Racing heartbeat. How is this diagnosed? This condition may be diagnosed based on:  Your symptoms and medical history.  A physical exam. During the exam, your health care provider will check for signs of blood loss, such as low blood pressure and a rapid pulse.  Tests, such as:  Flexible sigmoidoscopy. In this procedure, a flexible tube with a camera on the end is used to examine your anus and the first part of your colon to look for the source of bleeding.  Colonoscopy. This is similar to a flexible sigmoidoscopy, but the camera can extend all the way to the uppermost  part of your colon.  Blood tests to measure your red blood cell count and to check for coagulopathy.  An imaging study of your colon to look for a bleeding site. In some cases, you may have X-rays taken after a dye or radioactive substance is injected into your bloodstream (angiogram). How is this treated? Treatment for this condition depends on the cause of the bleeding. Heavy or persistent bleeding is treated at the hospital. Treatment may include:  Getting fluids through an IV tube inserted into one of your veins.  Getting blood through an IV tube (blood transfusion).  Stopping bleeding through high-heat coagulation, injections of certain medicines, or applying surgical clips. This can all be done during a colonoscopy.  Having a procedure that involves first doing an angiogram and then blocking blood flow to the bleeding site (embolization).  Stopping some of your regular medicines for a certain amount of time.  Having surgery to remove part of the colon. This may be needed if bleeding is severe and does not respond to other treatment. Follow these instructions at home:  Take over-the-counter and prescription medicines only as told by your health care provider. You may need to avoid aspirin,  NSAIDs, or other medicines that increase bleeding.  Eat foods that are high in fiber. This will help keep your stools soft. These foods include whole grains, legumes, fruits, and vegetables. Eating 1-3 prunes each day works well for many people.  Drink enough fluid to keep your urine clear or pale yellow.  Keep all follow-up visits as told by your health care provider. This is important. Contact a health care provider if:  Your symptoms do not improve. Get help right away if:  Your bleeding increases.  You feel light-headed or you faint.  You feel weak.  You have severe cramps in your back or abdomen.  You pass large blood clots in your stool.  Your symptoms get worse. This  information is not intended to replace advice given to you by your health care provider. Make sure you discuss any questions you have with your health care provider. Document Released: 06/14/2015 Document Revised: 07/05/2015 Document Reviewed: 06/14/2015 Elsevier Interactive Patient Education  2017 [employer]

## 2016-04-01 NOTE — Progress Notes (Signed)
Discharged patient with daughter. Patient did not have belongings in the room, daughter brought clothing for the patient.  Patients daughter was provided with education and discharge orders.

## 2016-04-01 NOTE — Plan of Care (Signed)
Problem: Education: Goal: Ability to identify signs and symptoms of gastrointestinal bleeding will improve Outcome: Adequate for Discharge Provided discharge summary outlining causes, risk factors, signs and symptoms and how to manage to patients daughter with teach back

## 2016-07-16 ENCOUNTER — Other Ambulatory Visit: Payer: Self-pay | Admitting: Internal Medicine

## 2016-09-15 ENCOUNTER — Other Ambulatory Visit: Payer: Self-pay | Admitting: Cardiovascular Disease

## 2016-10-24 ENCOUNTER — Encounter (HOSPITAL_BASED_OUTPATIENT_CLINIC_OR_DEPARTMENT_OTHER): Payer: Federal, State, Local not specified - PPO

## 2016-12-11 ENCOUNTER — Other Ambulatory Visit: Payer: Self-pay | Admitting: Cardiovascular Disease

## 2017-01-06 ENCOUNTER — Encounter (INDEPENDENT_AMBULATORY_CARE_PROVIDER_SITE_OTHER): Payer: Self-pay

## 2017-01-06 ENCOUNTER — Encounter: Payer: Self-pay | Admitting: Neurology

## 2017-01-06 ENCOUNTER — Ambulatory Visit: Payer: Federal, State, Local not specified - PPO | Admitting: Neurology

## 2017-01-06 ENCOUNTER — Other Ambulatory Visit: Payer: Self-pay | Admitting: Neurology

## 2017-01-06 VITALS — BP 149/71 | HR 70 | Ht 63.0 in | Wt 117.0 lb

## 2017-01-06 DIAGNOSIS — E538 Deficiency of other specified B group vitamins: Secondary | ICD-10-CM

## 2017-01-06 MED ORDER — MEMANTINE HCL 5 MG PO TABS: ORAL_TABLET | ORAL | 0 refills | Status: DC

## 2017-01-06 NOTE — Progress Notes (Signed)
Faxed printed/signed rx [facility] to [facility] [address] at [phone]. Received fax confirmation.

## 2017-01-06 NOTE — Progress Notes (Signed)
Reason for visit: Memory disturbance  Referring physician: Dr. Fara ChuteMillsaps  [name] is a 83 y.o. female  History of present illness:  [name] is a 83 year old right-handed white female with a history of a progressive memory problem over the last 5 years.  The memory has gotten particularly bad since 2015, the patient has a history of cerebrovascular disease and she had left-sided weakness in 2015.  MRI of the brain done at that time shows extensive white matter changes.  The patient has atrial fibrillation, she has orthostatic hypotension and she has had syncopal events because of this.  The patient has had some difficulty with agitation at times over the last year.  She is living with her daughter at this time.  She needs assistance with bathing, the patient can still dress herself, she needs supervision most of the day, she has some troubles with urinary incontinence at times.  The patient walks with a walker, she has not had any recent falls.  She does have some problems with hearing and she has macular degeneration with visual impairment.  The patient may have some agitation about once or twice a month.  She comes to this office for an evaluation.  Past Medical History:  Diagnosis Date  . Anxiety    "now & then; I get over it"; denies RX  . Arthritis   . Atrial fibrillation (HCC)    08/2005, normal EF at that time  . Blood transfusion    "my own blood"  . Diverticulitis 2018  . Dizziness   . Fracture of femoral neck, right (HCC) 2010  . Headache(784.0)    "terrible while going thru menopause; none since then"  . History of rectal cancer   . Hypertension   . MVP (mitral valve prolapse)    Not mentioned on 08/2005 echo however  . OP (osteoporosis)   . Pneumonia 05/2013   "once"  . Syncope 06/10/11   "fell; I've been having these spells; off & on; last time was maybe 2 yr ago"  . Tachyarrhythmia 06/11/11   burst of ST  . Wrist fracture    left    Past Surgical  History:  Procedure Laterality Date  . BREAST CYST EXCISION     right  . CATARACT EXTRACTION W/ INTRAOCULAR LENS  IMPLANT, BILATERAL    . COLECTOMY    . COLON SURGERY     parts of colon removed  . COLONOSCOPY    . INGUINAL HERNIA REPAIR     right  . TOTAL HIP ARTHROPLASTY     bilaterally  . TRANSTHORACIC ECHOCARDIOGRAM  08/20/2005   EF 60%    Family History  Problem Relation Age of Onset  . Heart disease Neg Hx     Social history:  reports that  has never smoked. she has never used smokeless tobacco. She reports that she does not drink alcohol or use drugs.  Medications:  Prior to Admission medications   Medication Sig Start Date End Date Taking? Authorizing Provider  acetaminophen (TYLENOL) 500 MG tablet Take 500-1,000 mg by mouth at bedtime.     [provider]  CARTIA XT 180 MG 24 hr capsule TAKE 1 CAPSULE(180 MG) BY MOUTH DAILY 09/15/16   Nahser, Deloris PingPhilip J, MD  Cranberry (SM CRANBERRY) 300 MG tablet Take 300 mg by mouth every other day.     [provider]  diltiazem (DILACOR XR) 180 MG 24 hr capsule Take 180 mg by mouth daily.  [provider]  ferrous sulfate 325 (65 FE) MG tablet Take 1 tablet (325 mg total) by mouth daily with breakfast. 04/02/16   Valentino NoseBoswell, Nathan, MD  fluticasone (FLONASE) 50 MCG/ACT nasal spray Place 1 spray into both nostrils daily.    [provider]  lactose free nutrition (BOOST PLUS) LIQD Take 237 mLs by mouth daily at 2 PM daily at 2 PM. 04/08/13   Rai, Ripudeep K, MD  losartan (COZAAR) 50 MG tablet Take 1 tablet (50 mg total) by mouth daily. Please make appt with Dr. Elease HashimotoNahser for January before anymore refills. 1st attempt 12/11/16   Nahser, Deloris PingPhilip J, MD  metoprolol tartrate (LOPRESSOR) 50 MG tablet TAKE 1 TABLET(50 MG) BY MOUTH TWICE DAILY 09/15/16   Nahser, Deloris PingPhilip J, MD  Multiple Vitamins-Minerals (MULTIVITAMIN GUMMIES WOMENS) CHEW Chew 1 each by mouth daily.     [provider]  Naphazoline HCl (CLEAR EYES  OP) Place 1 drop into both eyes at bedtime as needed (dry eyes/ itching).    [provider]  Pumpkin Seed-Soy Germ (AZO BLADDER CONTROL/GO-LESS PO) Take 1 capsule by mouth at bedtime.    [provider]      Allergies  Allergen Reactions  . Sulfa Drugs Cross Reactors Hives and Other (See Comments)    Almost passed out  . Irbesartan Other (See Comments)    Caused heart to race    ROS:  Out of a complete 14 system review of symptoms, the patient complains only of the following symptoms, and all other reviewed systems are negative.  Palpitations of the heart Hearing loss Visual loss, macular degeneration Moles Easy bruising, easy bleeding Memory loss, confusion Dizziness Allergies, runny nose Decreased energy Hallucinations  Blood pressure (!) 149/71, pulse 70, height 5\' 3"  (1.6 m), weight 117 lb (53.1 kg).  Physical Exam  General: The patient is alert and cooperative at the time of the examination.  Eyes: Pupils are equal, round, and reactive to light. Discs are flat bilaterally.  Neck: The neck is supple, no carotid bruits are noted.  Respiratory: The respiratory examination is clear.  Cardiovascular: The cardiovascular examination reveals a regular rate and rhythm, no obvious murmurs or rubs are noted.  Skin: Extremities are with 2+ edema below the knees bilaterally.  Neurologic Exam  Mental status: The patient is alert and oriented x 1 at the time of the examination (not oriented to place or date.). The Mini-Mental status examination done today shows a total score of 9/30..  Cranial nerves: Facial symmetry is present. There is good sensation of the face to pinprick and soft touch bilaterally. The strength of the facial muscles and the muscles to head turning and shoulder shrug are normal bilaterally. Speech is well enunciated, no aphasia or dysarthria is noted. Extraocular movements are full. Visual fields are full. The tongue is midline, and the  patient has symmetric elevation of the soft palate. No obvious hearing deficits are noted.  Motor: The motor testing reveals 5 over 5 strength of all 4 extremities. Good symmetric motor tone is noted throughout.  Sensory: Sensory testing is intact to vibration sensation on all 4 extremities.  There is some decrease in pinprick sensation on the right arm and right leg.  No evidence of extinction is noted.  Coordination: Cerebellar testing reveals good finger-nose-finger and heel-to-shin bilaterally.  Gait and station: Gait is wide-based, the patient requires assistance with walking, she normally walks with a walker.  Tandem gait was not attempted.  Romberg is negative.  Reflexes:  Deep tendon reflexes are symmetric, but are depressed bilaterally. Toes are downgoing bilaterally.   MRI brain 11/16/13:  IMPRESSION: No acute brain finding. No acute infarction to explain the presenting symptoms.  Extensive chronic small vessel ischemic changes throughout the brain as outlined above.  No major vessel occlusion or correctable proximal stenosis.  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 This would not likely relate to the presenting symptoms.  * MRI scan images were reviewed online. I agree with the written report.  Assessment/Plan:  1.  Progressive dementing illness  2.  Gait disorder  The patient likely has some combination of an Alzheimer's disease process and a multi-infarct dementia.  The patient has extensive white matter changes by MRI of the brain.  The patient is having some problems with agitation, she does not consistently recognize her daughter.  The patient will be placed on Namenda which should help some with the agitation.  The patient will follow-up in this office in about 6 months.  The daughter will call for any dose adjustments.  Marlan Palau MD 01/06/2017 3:40  PM  [facility] [address] [place], [place] [address]  Phone [phone] Fax [phone]

## 2017-01-06 NOTE — Patient Instructions (Signed)
   We will start namenda 5 mg tablets, look out for dizziness. When she gets to 10 mg twice a day, call our office for a prescription for the 10 mg tablets.

## 2017-01-07 ENCOUNTER — Telehealth: Payer: Self-pay | Admitting: *Deleted

## 2017-01-07 LAB — VITAMIN B12: VITAMIN B 12: 1053 pg/mL (ref 232–1245)

## 2017-01-07 NOTE — Telephone Encounter (Signed)
-----   Message from York Spanielharles K Willis, MD sent at 01/07/2017  7:13 AM EST -----   The blood work results are unremarkable. Please call the patient.  ----- Message ----- From: Nell RangeInterface, [facility] Lab Results In Sent: 01/07/2017   5:41 AM To: York Spanielharles K Willis, MD

## 2017-01-07 NOTE — Telephone Encounter (Signed)
Called and spoke with daughter, Amber [place]Roberta ([place] about unremarkable labs per CW,MD note. She verbalized understanding.

## 2017-01-30 ENCOUNTER — Telehealth: Payer: Self-pay | Admitting: Neurology

## 2017-01-30 MED ORDER — MEMANTINE HCL 5 MG PO TABS: 5.0000 mg | ORAL_TABLET | Freq: Two times a day (BID) | ORAL | 1 refills | Status: DC

## 2017-01-30 NOTE — Telephone Encounter (Addendum)
Called daughter back. She states patient having side effects when she went to 1 tablet in the am and 2 tablets in the pm of Aricept 5mg  tablet. Caused dizziness, high BP. She is wanting to know if she can keep her at 1 tablet in the am and 1 tab in the pm. She did okay on this dose. Advised I will send message to CW,MD to advise. We will call back to let her know.  She would need refill for medication if okay to take one in the am and one in the pm.

## 2017-01-30 NOTE — Addendum Note (Signed)
Addended by: York SpanielWILLIS, CHARLES K on: 01/30/2017 11:25 AM   Modules accepted: Orders

## 2017-01-30 NOTE — Telephone Encounter (Signed)
Pt daughter ([place]) is asking for a call back ON:[id number]:memantine (NAMENDA) 5 MG tablet

## 2017-01-30 NOTE — Telephone Encounter (Signed)
I called the patient and I talk with the daughter.  Namenda taking 5 mg twice daily, but when she went to a higher dose dizziness ensued.  The patient will cut back to 5 mg twice a day, I will call in a prescription to accommodate this.

## 2017-02-11 ENCOUNTER — Other Ambulatory Visit: Payer: Self-pay

## 2017-02-11 ENCOUNTER — Encounter (HOSPITAL_COMMUNITY): Payer: Self-pay | Admitting: Emergency Medicine

## 2017-02-11 ENCOUNTER — Inpatient Hospital Stay (HOSPITAL_COMMUNITY)
Admission: EM | Admit: 2017-02-11 | Discharge: 2017-02-14 | DRG: 378 | Disposition: A | Discharge status: Home / Self Care | Payer: Medicare Other | Attending: Internal Medicine | Admitting: Internal Medicine

## 2017-02-11 DIAGNOSIS — K5731 Diverticulosis of large intestine without perforation or abscess with bleeding: Principal | ICD-10-CM | POA: Diagnosis present

## 2017-02-11 DIAGNOSIS — Z9842 Cataract extraction status, left eye: Secondary | ICD-10-CM

## 2017-02-11 DIAGNOSIS — M810 Age-related osteoporosis without current pathological fracture: Secondary | ICD-10-CM | POA: Diagnosis present

## 2017-02-11 DIAGNOSIS — Z882 Allergy status to sulfonamides status: Secondary | ICD-10-CM

## 2017-02-11 DIAGNOSIS — F419 Anxiety disorder, unspecified: Secondary | ICD-10-CM | POA: Diagnosis present

## 2017-02-11 DIAGNOSIS — Z85048 Personal history of other malignant neoplasm of rectum, rectosigmoid junction, and anus: Secondary | ICD-10-CM

## 2017-02-11 DIAGNOSIS — I341 Nonrheumatic mitral (valve) prolapse: Secondary | ICD-10-CM | POA: Diagnosis present

## 2017-02-11 DIAGNOSIS — D62 Acute posthemorrhagic anemia: Secondary | ICD-10-CM | POA: Diagnosis present

## 2017-02-11 DIAGNOSIS — K921 Melena: Secondary | ICD-10-CM | POA: Diagnosis present

## 2017-02-11 DIAGNOSIS — I4891 Unspecified atrial fibrillation: Secondary | ICD-10-CM | POA: Diagnosis not present

## 2017-02-11 DIAGNOSIS — Z9049 Acquired absence of other specified parts of digestive tract: Secondary | ICD-10-CM

## 2017-02-11 DIAGNOSIS — I10 Essential (primary) hypertension: Secondary | ICD-10-CM | POA: Diagnosis not present

## 2017-02-11 DIAGNOSIS — Z9841 Cataract extraction status, right eye: Secondary | ICD-10-CM

## 2017-02-11 DIAGNOSIS — Z79899 Other long term (current) drug therapy: Secondary | ICD-10-CM

## 2017-02-11 DIAGNOSIS — E876 Hypokalemia: Secondary | ICD-10-CM | POA: Diagnosis present

## 2017-02-11 DIAGNOSIS — F0391 Unspecified dementia with behavioral disturbance: Secondary | ICD-10-CM

## 2017-02-11 DIAGNOSIS — Z7982 Long term (current) use of aspirin: Secondary | ICD-10-CM

## 2017-02-11 DIAGNOSIS — K625 Hemorrhage of anus and rectum: Secondary | ICD-10-CM | POA: Diagnosis present

## 2017-02-11 DIAGNOSIS — K922 Gastrointestinal hemorrhage, unspecified: Secondary | ICD-10-CM

## 2017-02-11 DIAGNOSIS — Z888 Allergy status to other drugs, medicaments and biological substances status: Secondary | ICD-10-CM

## 2017-02-11 DIAGNOSIS — Z66 Do not resuscitate: Secondary | ICD-10-CM | POA: Diagnosis present

## 2017-02-11 DIAGNOSIS — Z961 Presence of intraocular lens: Secondary | ICD-10-CM | POA: Diagnosis present

## 2017-02-11 DIAGNOSIS — F0390 Unspecified dementia without behavioral disturbance: Secondary | ICD-10-CM | POA: Diagnosis present

## 2017-02-11 DIAGNOSIS — M1990 Unspecified osteoarthritis, unspecified site: Secondary | ICD-10-CM | POA: Diagnosis present

## 2017-02-11 DIAGNOSIS — N2889 Other specified disorders of kidney and ureter: Secondary | ICD-10-CM | POA: Diagnosis present

## 2017-02-11 LAB — COMPREHENSIVE METABOLIC PANEL
ALBUMIN: 3.3 g/dL — AB (ref 3.5–5.0)
ALK PHOS: 70 U/L (ref 38–126)
ALT: 13 U/L — AB (ref 14–54)
AST: 21 U/L (ref 15–41)
Anion gap: 5 (ref 5–15)
BUN: 28 mg/dL — AB (ref 6–20)
CALCIUM: 8.9 mg/dL (ref 8.9–10.3)
CHLORIDE: 111 mmol/L (ref 101–111)
CO2: 27 mmol/L (ref 22–32)
CREATININE: 0.65 mg/dL (ref 0.44–1.00)
GFR calc non Af Amer: >60 mL/min (ref >60)
GLUCOSE: 125 mg/dL — AB (ref 65–99)
Potassium: 3.6 mmol/L (ref 3.5–5.1)
SODIUM: 143 mmol/L (ref 135–145)
Total Bilirubin: 0.6 mg/dL (ref 0.3–1.2)
Total Protein: 6.3 g/dL — ABNORMAL LOW (ref 6.5–8.1)

## 2017-02-11 LAB — CBC
HCT: 36.1 % (ref 36.0–46.0)
Hemoglobin: 11.3 g/dL — ABNORMAL LOW (ref 12.0–15.0)
MCH: 31.1 pg (ref 26.0–34.0)
MCHC: 31.3 g/dL (ref 30.0–36.0)
MCV: 99.4 fL (ref 78.0–100.0)
PLATELETS: 182 10*3/uL (ref 150–400)
RBC: 3.63 MIL/uL — AB (ref 3.87–5.11)
RDW: 13.4 % (ref 11.5–15.5)
WBC: 9.2 10*3/uL (ref 4.0–10.5)

## 2017-02-11 LAB — POC OCCULT BLOOD, ED: FECAL OCCULT BLD: POSITIVE — AB

## 2017-02-11 MED ORDER — IOPAMIDOL (ISOVUE-300) INJECTION 61%
INTRAVENOUS | Status: AC
  Administered 2017-02-12: 100 mL
  Filled 2017-02-11: qty 100

## 2017-02-11 NOTE — ED Provider Notes (Signed)
[facility] EMERGENCY DEPARTMENT Provider Note   CSN: [id number] Arrival date & time: 02/11/17  2107     History   Chief Complaint Chief Complaint  Patient presents with  . Rectal Bleeding    HPI [name] is a 84 y.o. female.  HPI   Level V caveat due to dementia.   [name] is a 84 y.o. female, with a history of GI bleed, presenting to the ED with rectal bleeding beginning tonight around 8pm. Daughter (POA) states pt began to have bright red and dark colored blood, including clots.  No complaints of abdominal pain, vomiting, fever, or any other complaints.  States patient is mental status has been to her baseline.  No anticoagulation. Pt had a GI bleed Feb 2018 for which she was admitted. There was no outpatient follow up from this.   Past Medical History:  Diagnosis Date  . Anxiety    "now & then; I get over it"; denies RX  . Arthritis   . Atrial fibrillation (HCC)    08/2005, normal EF at that time  . Blood transfusion    "my own blood"  . Diverticulitis 2018  . Dizziness   . Fracture of femoral neck, right (HCC) 2010  . Headache(784.0)    "terrible while going thru menopause; none since then"  . History of rectal cancer   . Hypertension   . MVP (mitral valve prolapse)    Not mentioned on 08/2005 echo however  . OP (osteoporosis)   . Pneumonia 05/2013   "once"  . Syncope 06/10/11   "fell; I've been having these spells; off & on; last time was maybe 2 yr ago"  . Tachyarrhythmia 06/11/11   burst of ST  . Wrist fracture    left    Patient Active Problem List   Diagnosis Date Noted  . Rectal bleeding 02/12/2017  . Essential hypertension   . Acute lower GI bleeding   . Atrial fibrillation with rapid ventricular response (HCC)   . Hematochezia   . GI bleed 03/28/2016  . Vertigo 03/21/2014  . Leg swelling 11/18/2013  . TIA (transient ischemic attack) 11/16/2013  . A-fib (HCC) 11/16/2013  . DNR (do not resuscitate) 11/16/2013  .  Dizziness 11/16/2013  . HCAP (healthcare-associated pneumonia) 05/09/2013  . Fall at home 04/06/2013  . Hypokalemia 04/06/2013  . Lumbar compression fracture (HCC) 04/05/2013  . Atrial fibrillation with RVR (HCC) 04/05/2013  . Orthostasis 06/12/2011  . Near syncope 06/12/2011  . HTN (hypertension) 11/20/2010  . Hyperlipidemia 11/20/2010    Past Surgical History:  Procedure Laterality Date  . BREAST CYST EXCISION     right  . CATARACT EXTRACTION W/ INTRAOCULAR LENS  IMPLANT, BILATERAL    . COLECTOMY    . COLON SURGERY     parts of colon removed  . COLONOSCOPY    . INGUINAL HERNIA REPAIR     right  . TOTAL HIP ARTHROPLASTY     bilaterally  . TRANSTHORACIC ECHOCARDIOGRAM  08/20/2005   EF 60%    OB History    No data available       Home Medications    Prior to Admission medications   Medication Sig Start Date End Date Taking? Authorizing Provider  acetaminophen (TYLENOL) 500 MG tablet Take 500-1,000 mg by mouth as needed for moderate pain (pain).    Yes [provider]  aspirin EC 325 MG tablet Take 325 mg by mouth every other day.   Yes [provider]  CARTIA XT 180 MG 24 hr capsule TAKE 1 CAPSULE(180 MG) BY MOUTH DAILY 09/15/16  Yes Nahser, Deloris Ping, MD  Cranberry (SM CRANBERRY) 300 MG tablet Take 300 mg by mouth as needed.    Yes [provider]  ferrous sulfate 325 (65 FE) MG tablet Take 1 tablet (325 mg total) by mouth daily with breakfast. 04/02/16  Yes Valentino Nose, MD  fluticasone (FLONASE) 50 MCG/ACT nasal spray Place 1 spray into both nostrils as needed.    Yes [provider]  furosemide (LASIX) 20 MG tablet Take 20 mg by mouth as needed for fluid or edema.   Yes [provider]  losartan (COZAAR) 50 MG tablet Take 1 tablet (50 mg total) by mouth daily. Please make appt with Dr. Elease Hashimoto for January before anymore refills. 1st attempt 12/11/16  Yes Nahser, Deloris Ping, MD  memantine (NAMENDA) 5 MG tablet Take 1 tablet (5 mg  total) by mouth 2 (two) times daily. 01/30/17  Yes York Spaniel, MD  metoprolol tartrate (LOPRESSOR) 50 MG tablet TAKE 1 TABLET(50 MG) BY MOUTH TWICE DAILY 09/15/16  Yes Nahser, Deloris Ping, MD  Multiple Vitamins-Minerals (MULTIVITAMIN GUMMIES WOMENS) CHEW Chew 1 each by mouth daily.    Yes [provider]  Naphazoline HCl (CLEAR EYES OP) Place 1 drop into both eyes at bedtime as needed (dry eyes/ itching).   Yes [provider]  Pumpkin Seed-Soy Germ (AZO BLADDER CONTROL/GO-LESS PO) Take 1 capsule by mouth at bedtime.   Yes [provider]  lactose free nutrition (BOOST PLUS) LIQD Take 237 mLs by mouth daily at 2 PM daily at 2 PM. Patient not taking: Reported on 02/11/2017 04/08/13   Cathren Harsh, MD    Family History Family History  Problem Relation Age of Onset  . Heart disease Neg Hx     Social History Social History   Tobacco Use  . Smoking status: Never Smoker  . Smokeless tobacco: Never Used  Substance Use Topics  . Alcohol use: No  . Drug use: No     Allergies   Sulfa drugs cross reactors and Irbesartan   Review of Systems Review of Systems  Unable to perform ROS: Dementia  Gastrointestinal: Positive for anal bleeding.     Physical Exam Updated Vital Signs BP (!) 154/69 (BP Location: Left Arm)   Pulse 62   Temp 98.7 F (37.1 C) (Oral)   Resp 18   Wt 53.1 kg (117 lb)   SpO2 97%   BMI 20.73 kg/m   Physical Exam  Constitutional: She appears well-developed and well-nourished. No distress.  HENT:  Head: Normocephalic and atraumatic.  Eyes: Conjunctivae are normal.  Neck: Neck supple.  Cardiovascular: Normal rate, regular rhythm, normal heart sounds and intact distal pulses.  Pulmonary/Chest: Effort normal and breath sounds normal. No respiratory distress.  Abdominal: Soft. There is no tenderness. There is no guarding.  Genitourinary: Rectal exam shows guaiac positive stool.  Genitourinary Comments: Bright red blood actively  oozing from rectum. A 3-4 cm round pile of blood clots is sitting in patient's brief. No noted rectal tenderness.   Labia and vaginal introitus were also evaluated during this exam with no evidence of hemorrhage from this region. RN, Caitlynn, served as Biomedical engineer during exam.  Musculoskeletal: She exhibits no edema.  Lymphadenopathy:    She has no cervical adenopathy.  Neurological: She is alert.  Skin: Skin is warm and dry. Capillary refill takes less than 2 seconds. She is not  diaphoretic.  Psychiatric: She has a normal mood and affect. Her behavior is normal.  Nursing note and vitals reviewed.    ED Treatments / Results  Labs (all labs ordered are listed, but only abnormal results are displayed) Labs Reviewed  COMPREHENSIVE METABOLIC PANEL - Abnormal; Notable for the following components:      Result Value   Glucose, Bld 125 (*)    BUN 28 (*)    Total Protein 6.3 (*)    Albumin 3.3 (*)    ALT 13 (*)    All other components within normal limits  CBC - Abnormal; Notable for the following components:   RBC 3.63 (*)    Hemoglobin 11.3 (*)    All other components within normal limits  POC OCCULT BLOOD, ED - Abnormal; Notable for the following components:   Fecal Occult Bld POSITIVE (*)    All other components within normal limits  HEMOGLOBIN AND HEMATOCRIT, BLOOD  HEMOGLOBIN AND HEMATOCRIT, BLOOD  HEMOGLOBIN AND HEMATOCRIT, BLOOD  HEMOGLOBIN AND HEMATOCRIT, BLOOD  PROTIME-INR  APTT  TYPE AND SCREEN    EKG  EKG Interpretation None       Radiology No results found.  Procedures Procedures (including critical care time)  Medications Ordered in ED Medications  iopamidol (ISOVUE-300) 61 % injection (not administered)  memantine (NAMENDA) tablet 5 mg (not administered)  acetaminophen (TYLENOL) tablet 500-1,000 mg (not administered)  ferrous sulfate tablet 325 mg (not administered)  fluticasone (FLONASE) 50 MCG/ACT nasal spray 1 spray (not administered)  losartan  (COZAAR) tablet 50 mg (not administered)  pantoprazole (PROTONIX) injection 40 mg (not administered)  0.9 %  sodium chloride infusion (not administered)     Initial Impression / Assessment and Plan / ED Course  I have reviewed the triage vital signs and the nursing notes.  Pertinent labs & imaging results that were available during my care of the patient were reviewed by me and considered in my medical decision making (see chart for details).  Clinical Course as of Feb 13 8  Wed Feb 11, 2017  2327 Spoke with Dr. Elnoria HowardHung, gastroenterologist.  States this sounds like a diverticular bleed, however, patient would not be appropriate for a colonoscopy.  Recommends admission for observation.  CT scan to confirm diverticular source of bleeding.  If bleeding worsens or patient becomes hemodynamically unstable, CT vascular flow studies may be appropriate. He will evaluate the patient in the morning.   [SJ]  2348 Spoke with Dr. Katrinka BlazingSmith, hospitalist, who agreed to admit the patient.   [SJ]    Clinical Course User Index [SJ] Ayeisha Lindenberger C, PA-C    Patient presents with bright red rectal bleeding.  Patient appears to be hemodynamically stable.  Hemoglobin level adequate.  Admission for observation.  CT of the abdomen and pelvis pending upon admission.   Findings and plan of care discussed with [facility] IncKristen Ward, DO. Dr. Elesa MassedWard personally evaluated and examined this patient.  Vitals:   02/11/17 2315 02/11/17 2330 02/11/17 2345 02/12/17 0000  BP: (!) 148/73 (!) 163/77 (!) 162/86 (!) 160/64  Pulse: 72 (!) 59 76 68  Resp: 16     Temp:      TempSrc:      SpO2: 95% 97% 97% 98%  Weight:          Final Clinical Impressions(s) / ED Diagnoses   Final diagnoses:  Acute GI bleeding    ED Discharge Orders    None       Anselm PancoastJoy, Angelmarie Ponzo C, PA-C 02/12/17  0011  

## 2017-02-11 NOTE — H&P (Signed)
History and Physical    [name]:[id number] DOB: 09-17-1914 DOA: 02/11/2017  Referring MD/NP/PA: Harolyn Rutherford PA-C PCP: Marva Panda, NP  Patient coming from: home  Chief Complaint: Rectal bleeding  I have personally briefly reviewed patient's old medical records in [facility]   HPI: [name] is a 84 y.o. female with medical history significant of HTN, A. fib, diverticulosis, GI bleed, anemia requiring blood transfusion, and dementia; who presented after acute onset of rectal bleeding starting 2000.  History is obtained from the patient's daughter who is her primary caregiver.  Patient was using the bathroom and screamed after seeing blood.  The patient daughter came into check on her and noted bright red blood with large dark clots in the toilet.  She only takes a daily aspirin, and is not on any  anticoagulation.  Last GI bleed was in 03/2016 for which the patient required multiple day stay and hemoglobin dropped as low as 8.1 and patient received transfusion of blood.  She was evaluated by GI, but due to her age no EGD or colonoscopy was performed.  Patient's daughter does not note any complaints of fevers, shortness of breath, cough, leg swelling, abdominal pain, nausea, vomiting, dysuria, or falls.  She was seen by Dr. Anne Hahn of neurology for dementia, and was started on Namenda and Aricept in 12/2016.  Which patient intermittently does not recognize family members   ED Course: Upon admission into the emergency department patient was seen to be afebrile, pulse 52-72, blood pressure 137/74-161/74, and all other labs within normal limits.  Labs revealed hemoglobin 11.3, BUN 28, creatinine 0.65.  Patient was typed and screened for possible need of blood products.  Dr. Elnoria Howard of GI was consulted and will see the patient in morning.  Recommending CT scan of the abdomen and for TRH to admit for observation.  Review of Systems  Unable to perform ROS: Dementia  Constitutional:  Negative for chills and fever.  HENT: Negative for nosebleeds and sore throat.   Eyes: Negative for photophobia and pain.  Respiratory: Negative for cough and shortness of breath.   Cardiovascular: Negative for chest pain and orthopnea.  Gastrointestinal: Negative for abdominal pain, nausea and vomiting.       Positive for rectal bleeding  Genitourinary: Negative for hematuria.  Musculoskeletal: Negative for falls.  Skin: Negative for rash.  Neurological: Negative for speech change, focal weakness and loss of consciousness.  Psychiatric/Behavioral: Positive for memory loss.    Past Medical History:  Diagnosis Date  . Anxiety    "now & then; I get over it"; denies RX  . Arthritis   . Atrial fibrillation (HCC)    08/2005, normal EF at that time  . Blood transfusion    "my own blood"  . Diverticulitis 2018  . Dizziness   . Fracture of femoral neck, right (HCC) 2010  . Headache(784.0)    "terrible while going thru menopause; none since then"  . History of rectal cancer   . Hypertension   . MVP (mitral valve prolapse)    Not mentioned on 08/2005 echo however  . OP (osteoporosis)   . Pneumonia 05/2013   "once"  . Syncope 06/10/11   "fell; I've been having these spells; off & on; last time was maybe 2 yr ago"  . Tachyarrhythmia 06/11/11   burst of ST  . Wrist fracture    left    Past Surgical History:  Procedure Laterality Date  . BREAST CYST EXCISION  right  . CATARACT EXTRACTION W/ INTRAOCULAR LENS  IMPLANT, BILATERAL    . COLECTOMY    . COLON SURGERY     parts of colon removed  . COLONOSCOPY    . INGUINAL HERNIA REPAIR     right  . TOTAL HIP ARTHROPLASTY     bilaterally  . TRANSTHORACIC ECHOCARDIOGRAM  08/20/2005   EF 60%     reports that  has never smoked. she has never used smokeless tobacco. She reports that she does not drink alcohol or use drugs.  Allergies  Allergen Reactions  . Sulfa Drugs Cross Reactors Hives and Other (See Comments)    Almost passed  out  . Irbesartan Other (See Comments)    Caused heart to race    Family History  Problem Relation Age of Onset  . Heart disease Neg Hx     Prior to Admission medications   Medication Sig Start Date End Date Taking? Authorizing Provider  acetaminophen (TYLENOL) 500 MG tablet Take 500-1,000 mg by mouth as needed for moderate pain (pain).    Yes [provider]  aspirin EC 325 MG tablet Take 325 mg by mouth every other day.   Yes [provider]  CARTIA XT 180 MG 24 hr capsule TAKE 1 CAPSULE(180 MG) BY MOUTH DAILY 09/15/16  Yes Nahser, Deloris Ping, MD  Cranberry (SM CRANBERRY) 300 MG tablet Take 300 mg by mouth as needed.    Yes [provider]  ferrous sulfate 325 (65 FE) MG tablet Take 1 tablet (325 mg total) by mouth daily with breakfast. 04/02/16  Yes Valentino Nose, MD  fluticasone (FLONASE) 50 MCG/ACT nasal spray Place 1 spray into both nostrils as needed.    Yes [provider]  furosemide (LASIX) 20 MG tablet Take 20 mg by mouth as needed for fluid or edema.   Yes [provider]  losartan (COZAAR) 50 MG tablet Take 1 tablet (50 mg total) by mouth daily. Please make appt with Dr. Elease Hashimoto for January before anymore refills. 1st attempt 12/11/16  Yes Nahser, Deloris Ping, MD  memantine (NAMENDA) 5 MG tablet Take 1 tablet (5 mg total) by mouth 2 (two) times daily. 01/30/17  Yes York Spaniel, MD  metoprolol tartrate (LOPRESSOR) 50 MG tablet TAKE 1 TABLET(50 MG) BY MOUTH TWICE DAILY 09/15/16  Yes Nahser, Deloris Ping, MD  Multiple Vitamins-Minerals (MULTIVITAMIN GUMMIES WOMENS) CHEW Chew 1 each by mouth daily.    Yes [provider]  Naphazoline HCl (CLEAR EYES OP) Place 1 drop into both eyes at bedtime as needed (dry eyes/ itching).   Yes [provider]  Pumpkin Seed-Soy Germ (AZO BLADDER CONTROL/GO-LESS PO) Take 1 capsule by mouth at bedtime.   Yes [provider]  lactose free nutrition (BOOST PLUS) LIQD Take 237 mLs by mouth  daily at 2 PM daily at 2 PM. Patient not taking: Reported on 02/11/2017 04/08/13   Cathren Harsh, MD    Physical Exam:  Constitutional: Elderly female in NAD, calm, comfortable Vitals:   02/11/17 2142 02/11/17 2230 02/11/17 2245 02/11/17 2315  BP: (!) 154/69 (!) 161/74 137/74 (!) 148/73  Pulse: 62 (!) 52 68 72  Resp: 18  18 16   Temp: 98.7 F (37.1 C)     TempSrc: Oral     SpO2: 97% 97% 94% 95%  Weight: 53.1 kg (117 lb)      Eyes: PERRL, lids and conjunctivae normal ENMT: Mucous membranes are moist. Posterior pharynx clear of any exudate or lesions.  Neck: normal, supple, no masses, no thyromegaly Respiratory: clear to auscultation bilaterally, no wheezing, no crackles. Normal respiratory effort. No accessory muscle use.  Cardiovascular: Regular rate and rhythm, no murmurs / rubs / gallops. No extremity edema. 2+ pedal pulses. No carotid bruits.  Abdomen: no tenderness, no masses palpated. No hepatosplenomegaly. Bowel sounds positive.  Musculoskeletal: no clubbing / cyanosis. No joint deformity upper and lower extremities. Good ROM, no contractures. Normal muscle tone.  Skin: no rashes, lesions, ulcers. No induration Neurologic: CN 2-12 grossly intact. Sensation intact, DTR normal. Strength 5/5 in all 4.  Psychiatric: Dementia.  Alert and oriented x 1. Normal mood.     Labs on Admission: I have personally reviewed following labs and imaging studies  CBC: Recent Labs  Lab 02/11/17 2154  WBC 9.2  HGB 11.3*  HCT 36.1  MCV 99.4  PLT 182   Basic Metabolic Panel: Recent Labs  Lab 02/11/17 2154  NA 143  K 3.6  CL 111  CO2 27  GLUCOSE 125*  BUN 28*  CREATININE 0.65  CALCIUM 8.9   GFR: Estimated Creatinine Clearance: 30.9 mL/min (by C-G formula based on SCr of 0.65 mg/dL). Liver Function Tests: Recent Labs  Lab 02/11/17 2154  AST 21  ALT 13*  ALKPHOS 70  BILITOT 0.6  PROT 6.3*  ALBUMIN 3.3*   No results for input(s): LIPASE, AMYLASE in the last 168  hours. No results for input(s): AMMONIA in the last 168 hours. Coagulation Profile: No results for input(s): INR, PROTIME in the last 168 hours. Cardiac Enzymes: No results for input(s): CKTOTAL, CKMB, CKMBINDEX, TROPONINI in the last 168 hours. BNP (last 3 results) No results for input(s): PROBNP in the last 8760 hours. HbA1C: No results for input(s): HGBA1C in the last 72 hours. CBG: No results for input(s): GLUCAP in the last 168 hours. Lipid Profile: No results for input(s): CHOL, HDL, LDLCALC, TRIG, CHOLHDL, LDLDIRECT in the last 72 hours. Thyroid Function Tests: No results for input(s): TSH, T4TOTAL, FREET4, T3FREE, THYROIDAB in the last 72 hours. Anemia Panel: No results for input(s): VITAMINB12, FOLATE, FERRITIN, TIBC, IRON, RETICCTPCT in the last 72 hours. Urine analysis:    Component Value Date/Time   COLORURINE YELLOW 03/28/2016 2316   APPEARANCEUR HAZY (A) 03/28/2016 2316   LABSPEC 1.015 03/28/2016 2316   PHURINE 5.0 03/28/2016 2316   GLUCOSEU NEGATIVE 03/28/2016 2316   HGBUR LARGE (A) 03/28/2016 2316   BILIRUBINUR NEGATIVE 03/28/2016 2316   KETONESUR 5 (A) 03/28/2016 2316   PROTEINUR NEGATIVE 03/28/2016 2316   UROBILINOGEN 0.2 11/16/2013 1045   NITRITE NEGATIVE 03/28/2016 2316   LEUKOCYTESUR NEGATIVE 03/28/2016 2316   Sepsis Labs: No results found for this or any previous visit (from the past 240 hour(s)).   Radiological Exams on Admission: No results found.  EKG: Ordered  Assessment/Plan Hematochezia/ suspected lower GI bleed: Acute.  Patient presents with acute rectal bleeding.  Previous history of diverticulitis and diverticular bleed in 03/2016.  Patient only on aspirin daily.  GI consulted and will follow up with the patient in a.m.  Noted elevated BUN, but suspect likely lower GI bleed. - Admit to MedSurg - Clear liquid diet -  H&H q 4 hour x 3  - Hold aspirin  - Normal saline IV 75 mL/h - Will monitor for need of blood transfusion - Follow-up CT  scan of abdomen as recommended by GI. - Dr. Elnoria HowardHung of GI consulted, follow-up for further recommendations  Essential hypertension: Blood pressures appear to be stable at this time -  Continue losartan and metoprolol as tolerated   H/O atrial fibrillation: Patient not on anticoagulation given previous history of  Dementia - Continue Aricept and Namenda  Protonix IV given x1 dose DVT prophylaxis: SCDs Code Status: DNR  Family Communication: Discussed plan of care with the patient and family present at bedside  Disposition Plan: Likely discharge home once medically stable Consults called: GI  Admission status: Observation  Clydie Braun MD [facility] Pager [phone]   If 7PM-7AM, please contact night-coverage [email or web address] Password [facility]  02/11/2017, 11:48 PM

## 2017-02-11 NOTE — ED Triage Notes (Signed)
Pt to ED with c/o rectal bleeding onset few hours ago.  Daughter st pt is passing clots as well. Pt denies any pain.

## 2017-02-11 NOTE — ED Notes (Signed)
Pt's daughter [name] [phone] would like to be contacted with any updates/ bed assignments.

## 2017-02-11 NOTE — ED Provider Notes (Signed)
Medical screening examination/treatment/procedure(s) were conducted as a shared visit with non-physician practitioner(s) and myself.  I personally evaluated the patient during the encounter.   EKG Interpretation None      Patient is a 84 year old female not on anticoagulation or antiplatelets who presents to the emergency department with rectal bleeding.  She is passing large clots.  Patient has had a previous diverticular bleed.  No abdominal pain.  Hemodynamically stable with hemoglobin of 11.  GI has been consulted.  Will admit to medicine for observation.  Abdominal exam benign.   Torin Modica, Layla MawKristen N, DO 02/11/17 2354

## 2017-02-12 ENCOUNTER — Emergency Department (HOSPITAL_COMMUNITY): Payer: Medicare Other

## 2017-02-12 DIAGNOSIS — F0390 Unspecified dementia without behavioral disturbance: Secondary | ICD-10-CM | POA: Diagnosis present

## 2017-02-12 DIAGNOSIS — K625 Hemorrhage of anus and rectum: Secondary | ICD-10-CM | POA: Diagnosis present

## 2017-02-12 DIAGNOSIS — K921 Melena: Secondary | ICD-10-CM | POA: Diagnosis not present

## 2017-02-12 LAB — BASIC METABOLIC PANEL
Anion gap: 4 — ABNORMAL LOW (ref 5–15)
BUN: 22 mg/dL — ABNORMAL HIGH (ref 6–20)
CALCIUM: 7.7 mg/dL — AB (ref 8.9–10.3)
CO2: 26 mmol/L (ref 22–32)
Chloride: 110 mmol/L (ref 101–111)
Creatinine, Ser: 0.55 mg/dL (ref 0.44–1.00)
Glucose, Bld: 105 mg/dL — ABNORMAL HIGH (ref 65–99)
Potassium: 3.3 mmol/L — ABNORMAL LOW (ref 3.5–5.1)
Sodium: 140 mmol/L (ref 135–145)

## 2017-02-12 LAB — HEMOGLOBIN AND HEMATOCRIT, BLOOD
HCT: 29.2 % — ABNORMAL LOW (ref 36.0–46.0)
HEMATOCRIT: 33.9 % — AB (ref 36.0–46.0)
HEMOGLOBIN: 10.7 g/dL — AB (ref 12.0–15.0)
Hemoglobin: 9.5 g/dL — ABNORMAL LOW (ref 12.0–15.0)

## 2017-02-12 LAB — PROTIME-INR
INR: 1.16
Prothrombin Time: 14.7 seconds (ref 11.4–15.2)

## 2017-02-12 LAB — PREPARE RBC (CROSSMATCH)

## 2017-02-12 LAB — APTT: APTT: 40 s — AB (ref 24–36)

## 2017-02-12 MED ORDER — MEMANTINE HCL 5 MG PO TABS
5.0000 mg | ORAL_TABLET | Freq: Two times a day (BID) | ORAL | Status: DC
  Administered 2017-02-12 – 2017-02-14 (×6): 5 mg via ORAL
  Filled 2017-02-12 (×6): qty 1

## 2017-02-12 MED ORDER — SODIUM CHLORIDE 0.9 % IV SOLN
Freq: Once | INTRAVENOUS | Status: AC
  Administered 2017-02-12: 12:00:00 via INTRAVENOUS

## 2017-02-12 MED ORDER — METOPROLOL TARTRATE 50 MG PO TABS
50.0000 mg | ORAL_TABLET | Freq: Two times a day (BID) | ORAL | Status: DC
  Administered 2017-02-12 – 2017-02-14 (×5): 50 mg via ORAL
  Filled 2017-02-12 (×5): qty 1

## 2017-02-12 MED ORDER — POTASSIUM CHLORIDE CRYS ER 20 MEQ PO TBCR
40.0000 meq | EXTENDED_RELEASE_TABLET | Freq: Once | ORAL | Status: AC
  Administered 2017-02-12: 40 meq via ORAL
  Filled 2017-02-12: qty 2

## 2017-02-12 MED ORDER — FLUTICASONE PROPIONATE 50 MCG/ACT NA SUSP: 1.0000 | Freq: Every day | NASAL | Status: DC | PRN

## 2017-02-12 MED ORDER — ACETAMINOPHEN 500 MG PO TABS: 500.0000 mg | ORAL_TABLET | Freq: Two times a day (BID) | ORAL | Status: DC | PRN

## 2017-02-12 MED ORDER — LOSARTAN POTASSIUM 50 MG PO TABS: 50.0000 mg | ORAL_TABLET | Freq: Every day | ORAL | Status: DC

## 2017-02-12 MED ORDER — PANTOPRAZOLE SODIUM 40 MG IV SOLR
40.0000 mg | Freq: Two times a day (BID) | INTRAVENOUS | Status: DC
  Administered 2017-02-12 – 2017-02-13 (×2): 40 mg via INTRAVENOUS
  Filled 2017-02-12 (×2): qty 40

## 2017-02-12 MED ORDER — FERROUS SULFATE 325 (65 FE) MG PO TABS
325.0000 mg | ORAL_TABLET | Freq: Every day | ORAL | Status: DC
  Administered 2017-02-12: 325 mg via ORAL
  Filled 2017-02-12: qty 1

## 2017-02-12 MED ORDER — SODIUM CHLORIDE 0.9 % IV SOLN
INTRAVENOUS | Status: DC
  Administered 2017-02-12 – 2017-02-14 (×4): via INTRAVENOUS

## 2017-02-12 MED ORDER — PANTOPRAZOLE SODIUM 40 MG IV SOLR
40.0000 mg | Freq: Once | INTRAVENOUS | Status: AC
  Administered 2017-02-12: 40 mg via INTRAVENOUS
  Filled 2017-02-12: qty 40

## 2017-02-12 NOTE — ED Notes (Signed)
Admitting MD at bedside.

## 2017-02-12 NOTE — Progress Notes (Signed)
Patient admitted from ED to room 5W09. Patient alert oriented to self appears to be at baseline. Vitals stable. Denies pain. Assessment completed, anterior right ankle abrasion red noted. BLE +1 edema. Pericare performed for bloody stool clots noted. Then patient had second stool [facility] Patient ambulates with one assistance, states she uses cane at home. Due to disorientation to time, did not complete admission questions at this time.

## 2017-02-12 NOTE — Progress Notes (Signed)
PROGRESS NOTE    [name]  OAC:[id number] DOB: Aug 05, 1914 DOA: 02/11/2017 PCP: Marva Panda, NP    Brief Narrative: [name] is a 84 y.o. female with medical history significant of HTN, A. fib, diverticulosis, GI bleed, anemia requiring blood transfusion, and dementia; who presented after acute onset of rectal bleeding.      Assessment & Plan:   Principal Problem:   Hematochezia Active Problems:   HTN (hypertension)   A-fib (HCC)   Dementia  1-Acute blood loss anemia, in setting of GI bleed.  Suspect diverticular bleed.  Hb drop to 9 from 11.  Has had 2 blood BM. Will transfuse one unit PRBC>  CT with diverticulosis, possible PRBC Dr Elnoria Howard consulted.  Cycle hb.  Hold iron supplements.   2-HTN;  Hold lisinopril in setting of bleeding.  Monitor BP on metoprolol.  3-History of a fib;  Resume metoprolol./  Hold cartia in setting of GI bleed. Will resume in 24 hours if BP stable.   Dementia;  Continue with Aricept and Namenda.  Hypokalemia;  Replete orally.      DVT prophylaxis: (SCD Code Status: DNR Family Communication: Disposition Plan: remain in patient.    Consultants:   GI, Dr Elnoria Howard   Procedures: none   Antimicrobials: none   Subjective: She denies abdominal pain. She had 2 BM last night and one this am.   Objective: Vitals:   02/11/17 2345 02/12/17 0000 02/12/17 0130 02/12/17 0500  BP: (!) 162/86 (!) 160/64 108/67 (!) 162/80  Pulse: 76 68 70 82  Resp:  16 18 18   Temp:   98.6 F (37 C) 98 F (36.7 C)  TempSrc:   Oral Oral  SpO2: 97% 98% 98% 95%  Weight:        Intake/Output Summary (Last 24 hours) at 02/12/2017 0830 Last data filed at 02/12/2017 0500 Gross per 24 hour  Intake 148.75 ml  Output -  Net 148.75 ml   Filed Weights   02/11/17 2142  Weight: 53.1 kg (117 lb)    Examination:  General exam: Appears calm and comfortable  Respiratory system: Clear to auscultation. Respiratory effort  normal. Cardiovascular system: S1 & S2 heard, RRR. No JVD, murmurs, rubs, gallops or clicks. No pedal edema. Gastrointestinal system: Abdomen is nondistended, soft and nontender. No organomegaly or masses felt. Normal bowel sounds heard. Central nervous system: Alert and oriented. No focal neurological deficits. Extremities: Symmetric 5 x 5 power. Skin: No rashes, lesions or ulcers    Data Reviewed: I have personally reviewed following labs and imaging studies  CBC: Recent Labs  Lab 02/11/17 2154 02/12/17 0707  WBC 9.2  --   HGB 11.3* 9.5*  HCT 36.1 29.2*  MCV 99.4  --   PLT 182  --    Basic Metabolic Panel: Recent Labs  Lab 02/11/17 2154 02/12/17 0707  NA 143 140  K 3.6 3.3*  CL 111 110  CO2 27 26  GLUCOSE 125* 105*  BUN 28* 22*  CREATININE 0.65 0.55  CALCIUM 8.9 7.7*   GFR: Estimated Creatinine Clearance: 30.9 mL/min (by C-G formula based on SCr of 0.55 mg/dL). Liver Function Tests: Recent Labs  Lab 02/11/17 2154  AST 21  ALT 13*  ALKPHOS 70  BILITOT 0.6  PROT 6.3*  ALBUMIN 3.3*   No results for input(s): LIPASE, AMYLASE in the last 168 hours. No results for input(s): AMMONIA in the last 168 hours. Coagulation Profile: Recent Labs  Lab 02/12/17 0210  INR 1.16  Cardiac Enzymes: No results for input(s): CKTOTAL, CKMB, CKMBINDEX, TROPONINI in the last 168 hours. BNP (last 3 results) No results for input(s): PROBNP in the last 8760 hours. HbA1C: No results for input(s): HGBA1C in the last 72 hours. CBG: No results for input(s): GLUCAP in the last 168 hours. Lipid Profile: No results for input(s): CHOL, HDL, LDLCALC, TRIG, CHOLHDL, LDLDIRECT in the last 72 hours. Thyroid Function Tests: No results for input(s): TSH, T4TOTAL, FREET4, T3FREE, THYROIDAB in the last 72 hours. Anemia Panel: No results for input(s): VITAMINB12, FOLATE, FERRITIN, TIBC, IRON, RETICCTPCT in the last 72 hours. Sepsis Labs: No results for input(s): PROCALCITON, LATICACIDVEN  in the last 168 hours.  No results found for this or any previous visit (from the past 240 hour(s)).       Radiology Studies: Ct Abdomen Pelvis W Contrast  Result Date: 02/12/2017 CLINICAL DATA:  Rectal bleeding. History of rectal cancer, diverticulitis, blood transfusions, inguinal hernia repair, and colectomy. EXAM: CT ABDOMEN AND PELVIS WITH CONTRAST TECHNIQUE: Multidetector CT imaging of the abdomen and pelvis was performed using the standard protocol following bolus administration of intravenous contrast. CONTRAST:  ISOVUE-300 IOPAMIDOL (ISOVUE-300) INJECTION 61% COMPARISON:  None. FINDINGS: Lower chest: Dependent atelectasis and fibrosis in the lung bases. Probable emphysematous changes. Hepatobiliary: Multiple hepatic cysts, largest in the lateral segment left lobe of the liver measuring 2.7 cm. These have a benign appearance. Gallbladder and bile ducts are unremarkable. Pancreas: Mild pancreatic ductal dilatation. Cause is not determined. Consider MRCP or ERCP in the elective setting to exclude obstructing lesion. No infiltrative changes. Spleen: 15 mm rounded calcification in the upper spleen likely representing postinflammatory process. Adrenals/Urinary Tract: No adrenal gland nodules. Bilateral renal cysts. Solid enhancing mass in the posterior midpole of the right kidney measuring 2.6 cm diameter. This likely represents a renal cell neoplasm. No renal vein thrombosis. No hydronephrosis or hydroureter. Bladder is not visualized due to streak artifact from bilateral hip arthroplasties. Stomach/Bowel: Stomach and small bowel are mostly decompressed. Anastomosis in the low rectal region. Limited visualization of the sigmoid colon due to streak artifact from the arthroplasties. Sigmoid diverticulosis is present. No definite evidence of inflammatory change or obstruction. Appendix is normal. Vascular/Lymphatic: Aortic atherosclerosis. No enlarged abdominal or pelvic lymph nodes. Reproductive:  Pelvis and reproductive organs are obscured by streak artifact from the hip arthroplasties. Other: No free air or free fluid in the abdomen. Postoperative changes in the anterior abdominal wall likely representing hernia closure. Musculoskeletal: Degenerative changes throughout lumbar spine. Compression of L1 and L2 vertebrae, probably chronic. Mild anterior subluxation of L4 on L5 is likely degenerative. Degenerative disc disease at L4-5. 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 Bilateral total hip arthroplasties. IMPRESSION: 1. Solid enhancing 2.6 cm lesion in the right kidney likely representing a renal cell carcinoma. Known metastases demonstrated. 2. Nonspecific pancreatic duct dilatation. Consider further evaluation with elective MRCP or ERCP to exclude an obstructing lesion. 3. Atelectasis, fibrosis, and emphysematous changes in the lung bases. 4. Benign-appearing hepatic and renal cysts. 5. Aortic atherosclerosis. 6. Diverticulosis of the sigmoid colon without evidence of diverticulitis. Portions of the low pelvis and sigmoid colon are obscured due to streak artifact from bilateral hip arthroplasties. 7. Multiple lumbar vertebral compression fractures likely representing osteoporosis. Tarlov cysts in the sacrum. Electronically Signed   By: Burman Nieves M.D.   On: 02/12/2017 00:39        Scheduled Meds: . ferrous sulfate  325 mg Oral Q breakfast  . losartan  50 mg Oral Daily  .  memantine  5 mg Oral BID   Continuous Infusions: . sodium chloride 75 mL/hr at 02/12/17 0301     LOS: 0 days    Time spent: 35 minutes.     Alba CoryBelkys A Regalado, MD [facility] Pager [phone]  If 7PM-7AM, please contact night-coverage [email or web address] Password [email or web address] 02/12/2017, 8:30 AM

## 2017-02-12 NOTE — Consult Note (Signed)
Reason for Consult: Hematocheziz Referring Physician: [facility]  [name] HPI: This is a 84 year old female with a PMH of diverticular bleed 03/2016, rectal tubulovillous adenoma in 2007, HTN, and anemia admitted for painless hematochezia.  Her bleeding started last evening and she had several bouts of hematochezia.  There were no reports of chest pain, SOB, or syncope.  In 03/2016 she presented with a diverticular bleed and it was confirmed with a bleeding scan.  The bleeding was isolated to the sigmoid colon.  Her last colonoscopy was in 2007 and a tubulovillous adenoma was removed.  At that time, with her advanced age no plans were made for any repeat evaluation.  Past Medical History:  Diagnosis Date  . Anxiety    "now & then; I get over it"; denies RX  . Arthritis   . Atrial fibrillation (HCC)    08/2005, normal EF at that time  . Blood transfusion    "my own blood"  . Diverticulitis 2018  . Dizziness   . Fracture of femoral neck, right (HCC) 2010  . Headache(784.0)    "terrible while going thru menopause; none since then"  . History of rectal cancer   . Hypertension   . MVP (mitral valve prolapse)    Not mentioned on 08/2005 echo however  . OP (osteoporosis)   . Pneumonia 05/2013   "once"  . Syncope 06/10/11   "fell; I've been having these spells; off & on; last time was maybe 2 yr ago"  . Tachyarrhythmia 06/11/11   burst of ST  . Wrist fracture    left    Past Surgical History:  Procedure Laterality Date  . BREAST CYST EXCISION     right  . CATARACT EXTRACTION W/ INTRAOCULAR LENS  IMPLANT, BILATERAL    . COLECTOMY    . COLON SURGERY     parts of colon removed  . COLONOSCOPY    . INGUINAL HERNIA REPAIR     right  . TOTAL HIP ARTHROPLASTY     bilaterally  . TRANSTHORACIC ECHOCARDIOGRAM  08/20/2005   EF 60%    Family History  Problem Relation Age of Onset  . Heart disease Neg Hx     Social History:  reports that  has never smoked. she has never used  smokeless tobacco. She reports that she does not drink alcohol or use drugs.  Allergies:  Allergies  Allergen Reactions  . Sulfa Drugs Cross Reactors Hives and Other (See Comments)    Almost passed out  . Irbesartan Other (See Comments)    Caused heart to race    Medications:  Scheduled: . memantine  5 mg Oral BID  . metoprolol tartrate  50 mg Oral BID  . pantoprazole (PROTONIX) IV  40 mg Intravenous Q12H   Continuous: . sodium chloride 75 mL/hr at 02/12/17 0301    Results for orders placed or performed during the hospital encounter of 02/11/17 (from the past 24 hour(s))  Comprehensive metabolic panel     Status: Abnormal   Collection Time: 02/11/17  9:54 PM  Result Value Ref Range   Sodium 143 135 - 145 mmol/L   Potassium 3.6 3.5 - 5.1 mmol/L   Chloride 111 101 - 111 mmol/L   CO2 27 22 - 32 mmol/L   Glucose, Bld 125 (H) 65 - 99 mg/dL   BUN 28 (H) 6 - 20 mg/dL   Creatinine, Ser 1.61 0.44 - 1.00 mg/dL   Calcium 8.9 8.9 - 09.6 mg/dL  Total Protein 6.3 (L) 6.5 - 8.1 g/dL   Albumin 3.3 (L) 3.5 - 5.0 g/dL   AST 21 15 - 41 U/L   ALT 13 (L) 14 - 54 U/L   Alkaline Phosphatase 70 38 - 126 U/L   Total Bilirubin 0.6 0.3 - 1.2 mg/dL   GFR calc non Af Amer >60 >60 mL/min   GFR calc Af Amer >60 >60 mL/min   Anion gap 5 5 - 15  CBC     Status: Abnormal   Collection Time: 02/11/17  9:54 PM  Result Value Ref Range   WBC 9.2 4.0 - 10.5 K/uL   RBC 3.63 (L) 3.87 - 5.11 MIL/uL   Hemoglobin 11.3 (L) 12.0 - 15.0 g/dL   HCT 16.136.1 09.636.0 - 04.546.0 %   MCV 99.4 78.0 - 100.0 fL   MCH 31.1 26.0 - 34.0 pg   MCHC 31.3 30.0 - 36.0 g/dL   RDW 40.913.4 81.111.5 - 91.415.5 %   Platelets 182 150 - 400 K/uL  Type and screen [facility]     Status: None (Preliminary result)   Collection Time: 02/11/17  9:55 PM  Result Value Ref Range   ABO/RH(D) O POS    Antibody Screen NEG    Sample Expiration 02/14/2017    Unit Number [id number]    Blood Component Type RED CELLS,LR    Unit division 00     Status of Unit ISSUED    Transfusion Status OK TO TRANSFUSE    Crossmatch Result Compatible   POC occult blood, ED     Status: Abnormal   Collection Time: 02/11/17 11:47 PM  Result Value Ref Range   Fecal Occult Bld POSITIVE (A) NEGATIVE  Protime-INR     Status: None   Collection Time: 02/12/17  2:10 AM  Result Value Ref Range   Prothrombin Time 14.7 11.4 - 15.2 seconds   INR 1.16   APTT     Status: Abnormal   Collection Time: 02/12/17  2:10 AM  Result Value Ref Range   aPTT 40 (H) 24 - 36 seconds  Hemoglobin and hematocrit, blood     Status: Abnormal   Collection Time: 02/12/17  7:07 AM  Result Value Ref Range   Hemoglobin 9.5 (L) 12.0 - 15.0 g/dL   HCT 78.429.2 (L) 69.636.0 - 29.546.0 %  Basic metabolic panel     Status: Abnormal   Collection Time: 02/12/17  7:07 AM  Result Value Ref Range   Sodium 140 135 - 145 mmol/L   Potassium 3.3 (L) 3.5 - 5.1 mmol/L   Chloride 110 101 - 111 mmol/L   CO2 26 22 - 32 mmol/L   Glucose, Bld 105 (H) 65 - 99 mg/dL   BUN 22 (H) 6 - 20 mg/dL   Creatinine, Ser 2.840.55 0.44 - 1.00 mg/dL   Calcium 7.7 (L) 8.9 - 10.3 mg/dL   GFR calc non Af Amer >60 >60 mL/min   GFR calc Af Amer >60 >60 mL/min   Anion gap 4 (L) 5 - 15  Prepare RBC     Status: None   Collection Time: 02/12/17  8:56 AM  Result Value Ref Range   Order Confirmation ORDER PROCESSED BY BLOOD BANK      Ct Abdomen Pelvis W Contrast  Result Date: 02/12/2017 CLINICAL DATA:  Rectal bleeding. History of rectal cancer, diverticulitis, blood transfusions, inguinal hernia repair, and colectomy. EXAM: CT ABDOMEN AND PELVIS WITH CONTRAST TECHNIQUE: Multidetector CT imaging of the abdomen and pelvis  was performed using the standard protocol following bolus administration of intravenous contrast. CONTRAST:  ISOVUE-300 IOPAMIDOL (ISOVUE-300) INJECTION 61% COMPARISON:  None. FINDINGS: Lower chest: Dependent atelectasis and fibrosis in the lung bases. Probable emphysematous changes. Hepatobiliary: Multiple  hepatic cysts, largest in the lateral segment left lobe of the liver measuring 2.7 cm. These have a benign appearance. Gallbladder and bile ducts are unremarkable. Pancreas: Mild pancreatic ductal dilatation. Cause is not determined. Consider MRCP or ERCP in the elective setting to exclude obstructing lesion. No infiltrative changes. Spleen: 15 mm rounded calcification in the upper spleen likely representing postinflammatory process. Adrenals/Urinary Tract: No adrenal gland nodules. Bilateral renal cysts. Solid enhancing mass in the posterior midpole of the right kidney measuring 2.6 cm diameter. This likely represents a renal cell neoplasm. No renal vein thrombosis. No hydronephrosis or hydroureter. Bladder is not visualized due to streak artifact from bilateral hip arthroplasties. Stomach/Bowel: Stomach and small bowel are mostly decompressed. Anastomosis in the low rectal region. Limited visualization of the sigmoid colon due to streak artifact from the arthroplasties. Sigmoid diverticulosis is present. No definite evidence of inflammatory change or obstruction. Appendix is normal. Vascular/Lymphatic: Aortic atherosclerosis. No enlarged abdominal or pelvic lymph nodes. Reproductive: Pelvis and reproductive organs are obscured by streak artifact from the hip arthroplasties. Other: No free air or free fluid in the abdomen. Postoperative changes in the anterior abdominal wall likely representing hernia closure. Musculoskeletal: Degenerative changes throughout lumbar spine. Compression of L1 and L2 vertebrae, probably chronic. Mild anterior subluxation of L4 on L5 is likely degenerative. Degenerative disc disease at L4-5. 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 Bilateral total hip arthroplasties. IMPRESSION: 1. Solid enhancing 2.6 cm lesion in the right kidney likely representing a renal cell carcinoma. Known metastases demonstrated. 2. Nonspecific pancreatic duct dilatation. Consider further evaluation with elective MRCP  or ERCP to exclude an obstructing lesion. 3. Atelectasis, fibrosis, and emphysematous changes in the lung bases. 4. Benign-appearing hepatic and renal cysts. 5. Aortic atherosclerosis. 6. Diverticulosis of the sigmoid colon without evidence of diverticulitis. Portions of the low pelvis and sigmoid colon are obscured due to streak artifact from bilateral hip arthroplasties. 7. Multiple lumbar vertebral compression fractures likely representing osteoporosis. Tarlov cysts in the sacrum. Electronically Signed   By: Burman Nieves M.D.   On: 02/12/2017 00:39    ROS:  As stated above in the HPI otherwise negative.  Blood pressure (!) 148/76, pulse 90, temperature 98 F (36.7 C), temperature source Oral, resp. rate 20, weight 53.1 kg (117 lb), SpO2 97 %.    PE: Gen: NAD, Alert and Oriented HEENT:  [place]/AT, EOMI Neck: Supple, no LAD Lungs: CTA Bilaterally CV: RRR without M/G/R ABM: Soft, NTND, +BS Ext: No C/C/E  Assessment/Plan: 1) Diverticular bleed. 2) Anemia. 3) History of tubulovillous adenoma.   Her presentation is consistent with a diverticular bleed.  With her advanced age it is high risk to pursue endoscopic work up.  Also to find a bleeding diverticula is difficult with a colonoscopy.  Her HGB dropped as expected, but overall she feels well.  She is hemodynamically stable and her daughter reports that the bleeding has declined.    Plan: 1) Monitor HGB. 2) Transfuse as needed. 3) No endoscopic work up at this time.  [name] D 02/12/2017, 5:04 PM

## 2017-02-12 NOTE — Progress Notes (Signed)
Lab stated unable to draw cbc at 2am, will draw at 8am unless needed now.

## 2017-02-12 NOTE — ED Notes (Signed)
Patient transported to CT 

## 2017-02-13 DIAGNOSIS — Z9842 Cataract extraction status, left eye: Secondary | ICD-10-CM | POA: Diagnosis not present

## 2017-02-13 DIAGNOSIS — K921 Melena: Secondary | ICD-10-CM | POA: Diagnosis not present

## 2017-02-13 DIAGNOSIS — I10 Essential (primary) hypertension: Secondary | ICD-10-CM | POA: Diagnosis present

## 2017-02-13 DIAGNOSIS — Z888 Allergy status to other drugs, medicaments and biological substances status: Secondary | ICD-10-CM | POA: Diagnosis not present

## 2017-02-13 DIAGNOSIS — Z85048 Personal history of other malignant neoplasm of rectum, rectosigmoid junction, and anus: Secondary | ICD-10-CM | POA: Diagnosis not present

## 2017-02-13 DIAGNOSIS — K5731 Diverticulosis of large intestine without perforation or abscess with bleeding: Secondary | ICD-10-CM | POA: Diagnosis present

## 2017-02-13 DIAGNOSIS — Z882 Allergy status to sulfonamides status: Secondary | ICD-10-CM | POA: Diagnosis not present

## 2017-02-13 DIAGNOSIS — E876 Hypokalemia: Secondary | ICD-10-CM | POA: Diagnosis present

## 2017-02-13 DIAGNOSIS — N2889 Other specified disorders of kidney and ureter: Secondary | ICD-10-CM | POA: Diagnosis present

## 2017-02-13 DIAGNOSIS — F0390 Unspecified dementia without behavioral disturbance: Secondary | ICD-10-CM | POA: Diagnosis present

## 2017-02-13 DIAGNOSIS — I341 Nonrheumatic mitral (valve) prolapse: Secondary | ICD-10-CM | POA: Diagnosis present

## 2017-02-13 DIAGNOSIS — K922 Gastrointestinal hemorrhage, unspecified: Secondary | ICD-10-CM | POA: Diagnosis present

## 2017-02-13 DIAGNOSIS — M1990 Unspecified osteoarthritis, unspecified site: Secondary | ICD-10-CM | POA: Diagnosis present

## 2017-02-13 DIAGNOSIS — Z7982 Long term (current) use of aspirin: Secondary | ICD-10-CM | POA: Diagnosis not present

## 2017-02-13 DIAGNOSIS — M810 Age-related osteoporosis without current pathological fracture: Secondary | ICD-10-CM | POA: Diagnosis present

## 2017-02-13 DIAGNOSIS — Z66 Do not resuscitate: Secondary | ICD-10-CM | POA: Diagnosis present

## 2017-02-13 DIAGNOSIS — Z961 Presence of intraocular lens: Secondary | ICD-10-CM | POA: Diagnosis present

## 2017-02-13 DIAGNOSIS — I4891 Unspecified atrial fibrillation: Secondary | ICD-10-CM | POA: Diagnosis present

## 2017-02-13 DIAGNOSIS — Z79899 Other long term (current) drug therapy: Secondary | ICD-10-CM | POA: Diagnosis not present

## 2017-02-13 DIAGNOSIS — Z9841 Cataract extraction status, right eye: Secondary | ICD-10-CM | POA: Diagnosis not present

## 2017-02-13 DIAGNOSIS — D62 Acute posthemorrhagic anemia: Secondary | ICD-10-CM | POA: Diagnosis present

## 2017-02-13 DIAGNOSIS — Z9049 Acquired absence of other specified parts of digestive tract: Secondary | ICD-10-CM | POA: Diagnosis not present

## 2017-02-13 DIAGNOSIS — F419 Anxiety disorder, unspecified: Secondary | ICD-10-CM | POA: Diagnosis present

## 2017-02-13 LAB — BASIC METABOLIC PANEL
ANION GAP: 6 (ref 5–15)
BUN: 11 mg/dL (ref 6–20)
CO2: 25 mmol/L (ref 22–32)
Calcium: 8.1 mg/dL — ABNORMAL LOW (ref 8.9–10.3)
Chloride: 112 mmol/L — ABNORMAL HIGH (ref 101–111)
Creatinine, Ser: 0.5 mg/dL (ref 0.44–1.00)
GFR calc Af Amer: >60 mL/min (ref >60)
GFR calc non Af Amer: >60 mL/min (ref >60)
GLUCOSE: 97 mg/dL (ref 65–99)
POTASSIUM: 3.6 mmol/L (ref 3.5–5.1)
Sodium: 143 mmol/L (ref 135–145)

## 2017-02-13 LAB — TYPE AND SCREEN
ABO/RH(D): O POS
Antibody Screen: NEGATIVE
Unit division: 0

## 2017-02-13 LAB — BPAM RBC
Blood Product Expiration Date: 201901192359
ISSUE DATE / TIME: 201901031001
Unit Type and Rh: 5100

## 2017-02-13 LAB — CBC
HEMATOCRIT: 33.1 % — AB (ref 36.0–46.0)
Hemoglobin: 10.6 g/dL — ABNORMAL LOW (ref 12.0–15.0)
MCH: 29 pg (ref 26.0–34.0)
MCHC: 32 g/dL (ref 30.0–36.0)
MCV: 90.4 fL (ref 78.0–100.0)
Platelets: 147 10*3/uL — ABNORMAL LOW (ref 150–400)
RBC: 3.66 MIL/uL — AB (ref 3.87–5.11)
RDW: 21.4 % — AB (ref 11.5–15.5)
WBC: 8.5 10*3/uL (ref 4.0–10.5)

## 2017-02-13 MED ORDER — DILTIAZEM HCL ER COATED BEADS 120 MG PO CP24
120.0000 mg | ORAL_CAPSULE | Freq: Every day | ORAL | Status: DC
  Administered 2017-02-13 – 2017-02-14 (×2): 120 mg via ORAL
  Filled 2017-02-13 (×2): qty 1

## 2017-02-13 MED ORDER — PANTOPRAZOLE SODIUM 40 MG PO TBEC
40.0000 mg | DELAYED_RELEASE_TABLET | Freq: Two times a day (BID) | ORAL | Status: DC
  Administered 2017-02-13 – 2017-02-14 (×2): 40 mg via ORAL
  Filled 2017-02-13 (×2): qty 1

## 2017-02-13 NOTE — Progress Notes (Signed)
PROGRESS NOTE    [name]  NFA:[id number] DOB: 04-09-1914 DOA: 02/11/2017 PCP: Marva Panda, NP    Brief Narrative: [name] is a 84 y.o. female with medical history significant of HTN, A. fib, diverticulosis, GI bleed, anemia requiring blood transfusion, and dementia; who presented after acute onset of rectal bleeding.      Assessment & Plan:   Principal Problem:   Hematochezia Active Problems:   HTN (hypertension)   A-fib (HCC)   Dementia  1-Acute blood loss anemia, in setting of GI bleed.  Suspect diverticular bleed.  Hb drop to 9 from 11.  Patient was transfuse 2 units PRBC 1-03. Hb today at 10.  CT with diverticulosis, possible PRBC Dr Elnoria Howard consulted.  Hold iron supplements.  Had couple bloody stool last night per nurse report.  Will observed overnight , repeat labs in am. Sooner if patient develops bloody stool.   2-HTN;  Hold lisinopril in setting of bleeding.  Monitor BP on metoprolol.  3-History of a fib;  Resume metoprolol. Resume cartia.   Dementia;  Continue with Aricept and Namenda.  Hypokalemia;  Replete orally.      DVT prophylaxis: (SCD Code Status: DNR Family Communication: Disposition Plan: remain in patient.    Consultants:   GI, Dr Elnoria Howard   Procedures: none   Antimicrobials: none   Subjective: She is feeling ok. She had two BM bloody last night per nurse report.  Patient denies abdominal pain.   Objective: Vitals:   02/12/17 1326 02/12/17 2130 02/13/17 0547 02/13/17 0547  BP: (!) 148/76 (!) 156/77 (!) 167/96 (!) 167/96  Pulse: 90 89 92 92  Resp: 20 14  16   Temp: 98 F (36.7 C) 98.6 F (37 C) 98.4 F (36.9 C) 98.4 F (36.9 C)  TempSrc: Oral Oral Oral Oral  SpO2: 97% 96% 95% 95%  Weight:        Intake/Output Summary (Last 24 hours) at 02/13/2017 1301 Last data filed at 02/13/2017 0859 Gross per 24 hour  Intake 2947.33 ml  Output 1975 ml  Net 972.33 ml   Filed Weights   02/11/17 2142  Weight:  53.1 kg (117 lb)    Examination:  General exam: NAD Respiratory system: CAT Cardiovascular system: S 1, S 2 RRR Gastrointestinal system: BS present, soft, nt Central nervous system:  Non focal.  Extremities: moves all 4 extremities.  Skin: no rashes.     Data Reviewed: I have personally reviewed following labs and imaging studies  CBC: Recent Labs  Lab 02/11/17 2154 02/12/17 0707 02/12/17 1810 02/13/17 0437  WBC 9.2  --   --  8.5  HGB 11.3* 9.5* 10.7* 10.6*  HCT 36.1 29.2* 33.9* 33.1*  MCV 99.4  --   --  90.4  PLT 182  --   --  147*   Basic Metabolic Panel: Recent Labs  Lab 02/11/17 2154 02/12/17 0707 02/13/17 0437  NA 143 140 143  K 3.6 3.3* 3.6  CL 111 110 112*  CO2 27 26 25   GLUCOSE 125* 105* 97  BUN 28* 22* 11  CREATININE 0.65 0.55 0.50  CALCIUM 8.9 7.7* 8.1*   GFR: Estimated Creatinine Clearance: 30.9 mL/min (by C-G formula based on SCr of 0.5 mg/dL). Liver Function Tests: Recent Labs  Lab 02/11/17 2154  AST 21  ALT 13*  ALKPHOS 70  BILITOT 0.6  PROT 6.3*  ALBUMIN 3.3*   No results for input(s): LIPASE, AMYLASE in the last 168 hours. No results for input(s): AMMONIA in  the last 168 hours. Coagulation Profile: Recent Labs  Lab 02/12/17 0210  INR 1.16   Cardiac Enzymes: No results for input(s): CKTOTAL, CKMB, CKMBINDEX, TROPONINI in the last 168 hours. BNP (last 3 results) No results for input(s): PROBNP in the last 8760 hours. HbA1C: No results for input(s): HGBA1C in the last 72 hours. CBG: No results for input(s): GLUCAP in the last 168 hours. Lipid Profile: No results for input(s): CHOL, HDL, LDLCALC, TRIG, CHOLHDL, LDLDIRECT in the last 72 hours. Thyroid Function Tests: No results for input(s): TSH, T4TOTAL, FREET4, T3FREE, THYROIDAB in the last 72 hours. Anemia Panel: No results for input(s): VITAMINB12, FOLATE, FERRITIN, TIBC, IRON, RETICCTPCT in the last 72 hours. Sepsis Labs: No results for input(s): PROCALCITON,  LATICACIDVEN in the last 168 hours.  No results found for this or any previous visit (from the past 240 hour(s)).       Radiology Studies: Ct Abdomen Pelvis W Contrast  Result Date: 02/12/2017 CLINICAL DATA:  Rectal bleeding. History of rectal cancer, diverticulitis, blood transfusions, inguinal hernia repair, and colectomy. EXAM: CT ABDOMEN AND PELVIS WITH CONTRAST TECHNIQUE: Multidetector CT imaging of the abdomen and pelvis was performed using the standard protocol following bolus administration of intravenous contrast. CONTRAST:  ISOVUE-300 IOPAMIDOL (ISOVUE-300) INJECTION 61% COMPARISON:  None. FINDINGS: Lower chest: Dependent atelectasis and fibrosis in the lung bases. Probable emphysematous changes. Hepatobiliary: Multiple hepatic cysts, largest in the lateral segment left lobe of the liver measuring 2.7 cm. These have a benign appearance. Gallbladder and bile ducts are unremarkable. Pancreas: Mild pancreatic ductal dilatation. Cause is not determined. Consider MRCP or ERCP in the elective setting to exclude obstructing lesion. No infiltrative changes. Spleen: 15 mm rounded calcification in the upper spleen likely representing postinflammatory process. Adrenals/Urinary Tract: No adrenal gland nodules. Bilateral renal cysts. Solid enhancing mass in the posterior midpole of the right kidney measuring 2.6 cm diameter. This likely represents a renal cell neoplasm. No renal vein thrombosis. No hydronephrosis or hydroureter. Bladder is not visualized due to streak artifact from bilateral hip arthroplasties. Stomach/Bowel: Stomach and small bowel are mostly decompressed. Anastomosis in the low rectal region. Limited visualization of the sigmoid colon due to streak artifact from the arthroplasties. Sigmoid diverticulosis is present. No definite evidence of inflammatory change or obstruction. Appendix is normal. Vascular/Lymphatic: Aortic atherosclerosis. No enlarged abdominal or pelvic lymph nodes.  Reproductive: Pelvis and reproductive organs are obscured by streak artifact from the hip arthroplasties. Other: No free air or free fluid in the abdomen. Postoperative changes in the anterior abdominal wall likely representing hernia closure. Musculoskeletal: Degenerative changes throughout lumbar spine. Compression of L1 and L2 vertebrae, probably chronic. Mild anterior subluxation of L4 on L5 is likely degenerative. Degenerative disc disease at L4-5. 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 Bilateral total hip arthroplasties. IMPRESSION: 1. Solid enhancing 2.6 cm lesion in the right kidney likely representing a renal cell carcinoma. Known metastases demonstrated. 2. Nonspecific pancreatic duct dilatation. Consider further evaluation with elective MRCP or ERCP to exclude an obstructing lesion. 3. Atelectasis, fibrosis, and emphysematous changes in the lung bases. 4. Benign-appearing hepatic and renal cysts. 5. Aortic atherosclerosis. 6. Diverticulosis of the sigmoid colon without evidence of diverticulitis. Portions of the low pelvis and sigmoid colon are obscured due to streak artifact from bilateral hip arthroplasties. 7. Multiple lumbar vertebral compression fractures likely representing osteoporosis. Tarlov cysts in the sacrum. Electronically Signed   By: Burman Nieves M.D.   On: 02/12/2017 00:39        Scheduled Meds: .  memantine  5 mg Oral BID  . metoprolol tartrate  50 mg Oral BID  . pantoprazole  40 mg Oral BID   Continuous Infusions: . sodium chloride 50 mL/hr at 02/13/17 1015     LOS: 0 days    Time spent: 35 minutes.     Alba CoryBelkys A Westley Blass, MD [facility] Pager [phone]  If 7PM-7AM, please contact night-coverage [email or web address] Password [email or web address] 02/13/2017, 1:01 PM

## 2017-02-13 NOTE — Progress Notes (Signed)
Subjective: No further hematochezia, per her report.  Objective: Vital signs in last 24 hours: Temp:  [98 F (36.7 C)-98.6 F (37 C)] 98.4 F (36.9 C) (01/04 0547) Pulse Rate:  [86-94] 92 (01/04 0547) Resp:  [14-22] 16 (01/04 0547) BP: (144-167)/(67-96) 167/96 (01/04 0547) SpO2:  [95 %-97 %] 95 % (01/04 0547) Last BM Date: 02/13/17  Intake/Output from previous day: 01/03 0701 - 01/04 0700 In: 3017.3 [P.O.:790; I.V.:1802.5; Blood:424.8] Out: 1100 [Urine:1100] Intake/Output this shift: No intake/output data recorded.  General appearance: alert and no distress GI: soft, non-tender; bowel sounds normal; no masses,  no organomegaly  Lab Results: Recent Labs    02/11/17 2154 02/12/17 0707 02/12/17 1810 02/13/17 0437  WBC 9.2  --   --  8.5  HGB 11.3* 9.5* 10.7* 10.6*  HCT 36.1 29.2* 33.9* 33.1*  PLT 182  --   --  147*   BMET Recent Labs    02/11/17 2154 02/12/17 0707 02/13/17 0437  NA 143 140 143  K 3.6 3.3* 3.6  CL 111 110 112*  CO2 27 26 25   GLUCOSE 125* 105* 97  BUN 28* 22* 11  CREATININE 0.65 0.55 0.50  CALCIUM 8.9 7.7* 8.1*   LFT Recent Labs    02/11/17 2154  PROT 6.3*  ALBUMIN 3.3*  AST 21  ALT 13*  ALKPHOS 70  BILITOT 0.6   PT/INR Recent Labs    02/12/17 0210  LABPROT 14.7  INR 1.16   Hepatitis Panel No results for input(s): HEPBSAG, HCVAB, HEPAIGM, HEPBIGM in the last 72 hours. C-Diff No results for input(s): CDIFFTOX in the last 72 hours. Fecal Lactopherrin No results for input(s): FECLLACTOFRN in the last 72 hours.  Studies/Results: Ct Abdomen Pelvis W Contrast  Result Date: 02/12/2017 CLINICAL DATA:  Rectal bleeding. History of rectal cancer, diverticulitis, blood transfusions, inguinal hernia repair, and colectomy. EXAM: CT ABDOMEN AND PELVIS WITH CONTRAST TECHNIQUE: Multidetector CT imaging of the abdomen and pelvis was performed using the standard protocol following bolus administration of intravenous contrast. CONTRAST:   ISOVUE-300 IOPAMIDOL (ISOVUE-300) INJECTION 61% COMPARISON:  None. FINDINGS: Lower chest: Dependent atelectasis and fibrosis in the lung bases. Probable emphysematous changes. Hepatobiliary: Multiple hepatic cysts, largest in the lateral segment left lobe of the liver measuring 2.7 cm. These have a benign appearance. Gallbladder and bile ducts are unremarkable. Pancreas: Mild pancreatic ductal dilatation. Cause is not determined. Consider MRCP or ERCP in the elective setting to exclude obstructing lesion. No infiltrative changes. Spleen: 15 mm rounded calcification in the upper spleen likely representing postinflammatory process. Adrenals/Urinary Tract: No adrenal gland nodules. Bilateral renal cysts. Solid enhancing mass in the posterior midpole of the right kidney measuring 2.6 cm diameter. This likely represents a renal cell neoplasm. No renal vein thrombosis. No hydronephrosis or hydroureter. Bladder is not visualized due to streak artifact from bilateral hip arthroplasties. Stomach/Bowel: Stomach and small bowel are mostly decompressed. Anastomosis in the low rectal region. Limited visualization of the sigmoid colon due to streak artifact from the arthroplasties. Sigmoid diverticulosis is present. No definite evidence of inflammatory change or obstruction. Appendix is normal. Vascular/Lymphatic: Aortic atherosclerosis. No enlarged abdominal or pelvic lymph nodes. Reproductive: Pelvis and reproductive organs are obscured by streak artifact from the hip arthroplasties. Other: No free air or free fluid in the abdomen. Postoperative changes in the anterior abdominal wall likely representing hernia closure. Musculoskeletal: Degenerative changes throughout lumbar spine. Compression of L1 and L2 vertebrae, probably chronic. Mild anterior subluxation of L4 on L5 is likely degenerative. Degenerative  disc disease at L4-5. 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 Bilateral total hip arthroplasties. IMPRESSION: 1. Solid enhancing  2.6 cm lesion in the right kidney likely representing a renal cell carcinoma. Known metastases demonstrated. 2. Nonspecific pancreatic duct dilatation. Consider further evaluation with elective MRCP or ERCP to exclude an obstructing lesion. 3. Atelectasis, fibrosis, and emphysematous changes in the lung bases. 4. Benign-appearing hepatic and renal cysts. 5. Aortic atherosclerosis. 6. Diverticulosis of the sigmoid colon without evidence of diverticulitis. Portions of the low pelvis and sigmoid colon are obscured due to streak artifact from bilateral hip arthroplasties. 7. Multiple lumbar vertebral compression fractures likely representing osteoporosis. Tarlov cysts in the sacrum. Electronically Signed   By: Burman NievesWilliam  Stevens M.Berry.   On: 02/12/2017 00:39    Medications:  Scheduled: . memantine  5 mg Oral BID  . metoprolol tartrate  50 mg Oral BID  . pantoprazole (PROTONIX) IV  40 mg Intravenous Q12H   Continuous: . sodium chloride 75 mL/hr at 02/12/17 1857    Assessment/Plan: 1) Diverticular bleed. 2) Anemia.   She reports no further bleeding.  This is the nature of diverticular bleeds.  Her HGB is stable.  No further GI evaluation at this time.  She and her daughter know that bleeding can restart at any time.  Plan: 1) Follow up in 2-4 weeks. 2) Signing off.  LOS: 0 days   [name] 02/13/2017, 7:59 AM

## 2017-02-14 LAB — HEMOGLOBIN AND HEMATOCRIT, BLOOD
HEMATOCRIT: 34.3 % — AB (ref 36.0–46.0)
HEMOGLOBIN: 10.9 g/dL — AB (ref 12.0–15.0)

## 2017-02-14 MED ORDER — PANTOPRAZOLE SODIUM 40 MG PO TBEC: 40.0000 mg | DELAYED_RELEASE_TABLET | Freq: Every day | ORAL | 0 refills | Status: AC

## 2017-02-14 NOTE — Progress Notes (Signed)
[name] to be D/C'd Home per MD order.  Discussed with the patient and all questions fully answered.  VSS, Skin clean, dry and intact without evidence of skin break down, no evidence of skin tears noted. IV catheter discontinued intact. Site without signs and symptoms of complications. Dressing and pressure applied.  An After Visit Summary was printed and given to the patient. Patient received prescription.  D/c education completed with patient/family including follow up instructions, medication list, d/c activities limitations if indicated, with other d/c instructions as indicated by MD - patient able to verbalize understanding, all questions fully answered.   Patient instructed to return to ED, call 911, or call MD for any changes in condition.   [facility], and D/C'd home via private auto.  [name] 02/14/2017 11:01 AM

## 2017-02-14 NOTE — Progress Notes (Signed)
Subjective: No acute events.  Nursing does not report any further hematochezia.  She had a couple of nonbloody bowel movements.  Objective: Vital signs in last 24 hours: Temp:  [97.7 F (36.5 C)-97.9 F (36.6 C)] 97.7 F (36.5 C) (01/05 0541) Pulse Rate:  [75-79] 79 (01/05 0541) Resp:  [16-17] 17 (01/05 0541) BP: (137-160)/(69-75) 160/70 (01/05 0541) SpO2:  [96 %-99 %] 96 % (01/05 0541) Last BM Date: 02/13/17  Intake/Output from previous day: 01/04 0701 - 01/05 0700 In: 720 [P.O.:720] Out: 1725 [Urine:1725] Intake/Output this shift: No intake/output data recorded.  General appearance: soundly sleeping  Lab Results: Recent Labs    02/11/17 2154 02/12/17 0707 02/12/17 1810 02/13/17 0437  WBC 9.2  --   --  8.5  HGB 11.3* 9.5* 10.7* 10.6*  HCT 36.1 29.2* 33.9* 33.1*  PLT 182  --   --  147*   BMET Recent Labs    02/11/17 2154 02/12/17 0707 02/13/17 0437  NA 143 140 143  K 3.6 3.3* 3.6  CL 111 110 112*  CO2 27 26 25   GLUCOSE 125* 105* 97  BUN 28* 22* 11  CREATININE 0.65 0.55 0.50  CALCIUM 8.9 7.7* 8.1*   LFT Recent Labs    02/11/17 2154  PROT 6.3*  ALBUMIN 3.3*  AST 21  ALT 13*  ALKPHOS 70  BILITOT 0.6   PT/INR Recent Labs    02/12/17 0210  LABPROT 14.7  INR 1.16   Hepatitis Panel No results for input(s): HEPBSAG, HCVAB, HEPAIGM, HEPBIGM in the last 72 hours. C-Diff No results for input(s): CDIFFTOX in the last 72 hours. Fecal Lactopherrin No results for input(s): FECLLACTOFRN in the last 72 hours.  Studies/Results: No results found.  Medications:  Scheduled: . diltiazem  120 mg Oral Daily  . memantine  5 mg Oral BID  . metoprolol tartrate  50 mg Oral BID  . pantoprazole  40 mg Oral BID   Continuous: . sodium chloride 50 mL/hr at 02/14/17 [id number]    Assessment/Plan: 1) Diverticular bleed. 2) Anemia.   HGB is stable.  No further bleeding.  I reviewed my notes from the prior 2007 colonoscopy and she had a tubulovillous rectal adenoma.   The office reached out to her a few years later for a routine follow up for the TVA, but she declined.  Given the histology of the adenoma, I am going to have her follow up in the office.  I think she can benefit with a FFS, but I will have further conversations about this issue with her daughter.  Plan: 1) Okay to D/C home. 2) Signing off. 3) Follow up in the office in 2-4 weeks.  LOS: 1 day   Alano Blasco D 02/14/2017, 7:54 AM

## 2017-02-14 NOTE — Discharge Summary (Signed)
Physician Discharge Summary  [name] ZOX:[id number] DOB: [birth year] DOA: 02/11/2017  PCP: Marva Panda, NP  Admit date: 02/11/2017 Discharge date: 02/14/2017  Admitted From: Home  Disposition: Home   Recommendations for Outpatient Follow-up:  1. Follow up with PCP in 1-2 weeks 2. Please obtain BMP/CBC in one week 3. Needs to follow up with Dr Elnoria Howard for evaluation of adenoma, possible FFS 4. Needs to follow up with PCP for discussion of renal mass evaluation.  5. Follow hb level/.  6. Resume aspirin when ok by Dr Elnoria Howard    Discharge Condition: stable.  CODE STATUS: DNR Diet recommendation: Heart Healthy   Brief/Interim Summary: Brief Narrative: Amber Weisner Johnsonis a 84 y.o.femalewith medical history significant ofHTN, A. fib, diverticulosis,GI bleed, anemia requiring blood transfusion, and dementia;who presented after acute onset of rectal bleeding.      Assessment & Plan:   Principal Problem:   Hematochezia Active Problems:   HTN (hypertension)   A-fib (HCC)   Dementia  1-Acute blood loss anemia, in setting of GI bleed.  Suspect diverticular bleed.  Hb drop to 9 from 11.  Patient was transfuse 2 units PRBC 1-03.  CT with diverticulosis, possible RCC Dr Elnoria Howard consulted.  Resume iron at discharge.  No further bleeding.  Hb stable.   2-HTN;  On metoprolol, cartia. Resume lisinopril at discharge   3-History of a fib;  Resume metoprolol. Resume cartia.   Dementia;  Continue with Aricept and Namenda.  Hypokalemia;  Replete orally.   Renal Mass; family aware. They will follow up with patients PCP for further discussion of care.      Discharge Diagnoses:  Principal Problem:   Hematochezia Active Problems:   HTN (hypertension)   A-fib (HCC)   Rectal bleeding   Dementia    Discharge Instructions  Discharge Instructions    Diet - low sodium heart healthy   Complete by:  As directed    Increase activity slowly   Complete by:  As  directed      Allergies as of 02/14/2017      Reactions   Sulfa Drugs Cross Reactors Hives, Other (See Comments)   Almost passed out   Irbesartan Other (See Comments)   Caused heart to race      Medication List    STOP taking these medications   aspirin EC 325 MG tablet     TAKE these medications   acetaminophen 500 MG tablet Commonly known as:  TYLENOL Take 500-1,000 mg by mouth as needed for moderate pain (pain).   AZO BLADDER CONTROL/GO-LESS PO Take 1 capsule by mouth at bedtime.   CARTIA XT 180 MG 24 hr capsule Generic drug:  diltiazem TAKE 1 CAPSULE(180 MG) BY MOUTH DAILY   CLEAR EYES OP Place 1 drop into both eyes at bedtime as needed (dry eyes/ itching).   ferrous sulfate 325 (65 FE) MG tablet Take 1 tablet (325 mg total) by mouth daily with breakfast.   fluticasone 50 MCG/ACT nasal spray Commonly known as:  FLONASE Place 1 spray into both nostrils as needed.   furosemide 20 MG tablet Commonly known as:  LASIX Take 20 mg by mouth as needed for fluid or edema.   lactose free nutrition Liqd Take 237 mLs by mouth daily at 2 PM daily at 2 PM.   losartan 50 MG tablet Commonly known as:  COZAAR Take 1 tablet (50 mg total) by mouth daily. Please make appt with Dr. Elease Hashimoto for January before anymore refills. 1st attempt  memantine 5 MG tablet Commonly known as:  NAMENDA Take 1 tablet (5 mg total) by mouth 2 (two) times daily.   metoprolol tartrate 50 MG tablet Commonly known as:  LOPRESSOR TAKE 1 TABLET(50 MG) BY MOUTH TWICE DAILY   MULTIVITAMIN GUMMIES WOMENS Chew Chew 1 each by mouth daily.   pantoprazole 40 MG tablet Commonly known as:  PROTONIX Take 1 tablet (40 mg total) by mouth daily.   SM CRANBERRY 300 MG tablet Generic drug:  Cranberry Take 300 mg by mouth as needed.       Allergies  Allergen Reactions  . Sulfa Drugs Cross Reactors Hives and Other (See Comments)    Almost passed out  . Irbesartan Other (See Comments)    Caused heart  to race    Consultations:  GI   Procedures/Studies: Ct Abdomen Pelvis W Contrast  Result Date: 02/12/2017 CLINICAL DATA:  Rectal bleeding. History of rectal cancer, diverticulitis, blood transfusions, inguinal hernia repair, and colectomy. EXAM: CT ABDOMEN AND PELVIS WITH CONTRAST TECHNIQUE: Multidetector CT imaging of the abdomen and pelvis was performed using the standard protocol following bolus administration of intravenous contrast. CONTRAST:  ISOVUE-300 IOPAMIDOL (ISOVUE-300) INJECTION 61% COMPARISON:  None. FINDINGS: Lower chest: Dependent atelectasis and fibrosis in the lung bases. Probable emphysematous changes. Hepatobiliary: Multiple hepatic cysts, largest in the lateral segment left lobe of the liver measuring 2.7 cm. These have a benign appearance. Gallbladder and bile ducts are unremarkable. Pancreas: Mild pancreatic ductal dilatation. Cause is not determined. Consider MRCP or ERCP in the elective setting to exclude obstructing lesion. No infiltrative changes. Spleen: 15 mm rounded calcification in the upper spleen likely representing postinflammatory process. Adrenals/Urinary Tract: No adrenal gland nodules. Bilateral renal cysts. Solid enhancing mass in the posterior midpole of the right kidney measuring 2.6 cm diameter. This likely represents a renal cell neoplasm. No renal vein thrombosis. No hydronephrosis or hydroureter. Bladder is not visualized due to streak artifact from bilateral hip arthroplasties. Stomach/Bowel: Stomach and small bowel are mostly decompressed. Anastomosis in the low rectal region. Limited visualization of the sigmoid colon due to streak artifact from the arthroplasties. Sigmoid diverticulosis is present. No definite evidence of inflammatory change or obstruction. Appendix is normal. Vascular/Lymphatic: Aortic atherosclerosis. No enlarged abdominal or pelvic lymph nodes. Reproductive: Pelvis and reproductive organs are obscured by streak artifact from the hip  arthroplasties. Other: No free air or free fluid in the abdomen. Postoperative changes in the anterior abdominal wall likely representing hernia closure. Musculoskeletal: Degenerative changes throughout lumbar spine. Compression of L1 and L2 vertebrae, probably chronic. Mild anterior subluxation of L4 on L5 is likely degenerative. Degenerative disc disease at L4-5. 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 Bilateral total hip arthroplasties. IMPRESSION: 1. Solid enhancing 2.6 cm lesion in the right kidney likely representing a renal cell carcinoma. Known metastases demonstrated. 2. Nonspecific pancreatic duct dilatation. Consider further evaluation with elective MRCP or ERCP to exclude an obstructing lesion. 3. Atelectasis, fibrosis, and emphysematous changes in the lung bases. 4. Benign-appearing hepatic and renal cysts. 5. Aortic atherosclerosis. 6. Diverticulosis of the sigmoid colon without evidence of diverticulitis. Portions of the low pelvis and sigmoid colon are obscured due to streak artifact from bilateral hip arthroplasties. 7. Multiple lumbar vertebral compression fractures likely representing osteoporosis. Tarlov cysts in the sacrum. Electronically Signed   By: Burman Nieves M.D.   On: 02/12/2017 00:39       Subjective: No further bloody stool. Feeling well.   Discharge Exam: Vitals:   02/13/17 2038 02/14/17 0541  BP: (!) 147/75 (!) 160/70  Pulse: 75 79  Resp: 17 17  Temp: 97.8 F (36.6 C) 97.7 F (36.5 C)  SpO2: 99% 96%   Vitals:   02/13/17 0547 02/13/17 1440 02/13/17 2038 02/14/17 0541  BP: (!) 167/96 137/69 (!) 147/75 (!) 160/70  Pulse: 92 77 75 79  Resp: 16 16 17 17   Temp: 98.4 F (36.9 C) 97.9 F (36.6 C) 97.8 F (36.6 C) 97.7 F (36.5 C)  TempSrc: Oral Oral Oral Oral  SpO2: 95% 99% 99% 96%  Weight:        General: Pt is alert, awake, not in acute distress Cardiovascular: RRR, S1/S2 +, no rubs, no gallops Respiratory: CTA bilaterally, no wheezing, no  rhonchi Abdominal: Soft, NT, ND, bowel sounds + Extremities: no edema, no cyanosis    The results of significant diagnostics from this hospitalization (including imaging, microbiology, ancillary and laboratory) are listed below for reference.     Microbiology: No results found for this or any previous visit (from the past 240 hour(s)).   Labs: BNP (last 3 results) No results for input(s): BNP in the last 8760 hours. Basic Metabolic Panel: Recent Labs  Lab 02/11/17 2154 02/12/17 0707 02/13/17 0437  NA 143 140 143  K 3.6 3.3* 3.6  CL 111 110 112*  CO2 27 26 25   GLUCOSE 125* 105* 97  BUN 28* 22* 11  CREATININE 0.65 0.55 0.50  CALCIUM 8.9 7.7* 8.1*   Liver Function Tests: Recent Labs  Lab 02/11/17 2154  AST 21  ALT 13*  ALKPHOS 70  BILITOT 0.6  PROT 6.3*  ALBUMIN 3.3*   No results for input(s): LIPASE, AMYLASE in the last 168 hours. No results for input(s): AMMONIA in the last 168 hours. CBC: Recent Labs  Lab 02/11/17 2154 02/12/17 0707 02/12/17 1810 02/13/17 0437 02/14/17 0752  WBC 9.2  --   --  8.5  --   HGB 11.3* 9.5* 10.7* 10.6* 10.9*  HCT 36.1 29.2* 33.9* 33.1* 34.3*  MCV 99.4  --   --  90.4  --   PLT 182  --   --  147*  --    Cardiac Enzymes: No results for input(s): CKTOTAL, CKMB, CKMBINDEX, TROPONINI in the last 168 hours. BNP: Invalid input(s): POCBNP CBG: No results for input(s): GLUCAP in the last 168 hours. D-Dimer No results for input(s): DDIMER in the last 72 hours. Hgb A1c No results for input(s): HGBA1C in the last 72 hours. Lipid Profile No results for input(s): CHOL, HDL, LDLCALC, TRIG, CHOLHDL, LDLDIRECT in the last 72 hours. Thyroid function studies No results for input(s): TSH, T4TOTAL, T3FREE, THYROIDAB in the last 72 hours.  Invalid input(s): FREET3 Anemia work up No results for input(s): VITAMINB12, FOLATE, FERRITIN, TIBC, IRON, RETICCTPCT in the last 72 hours. Urinalysis    Component Value Date/Time   COLORURINE YELLOW  03/28/2016 2316   APPEARANCEUR HAZY (A) 03/28/2016 2316   LABSPEC 1.015 03/28/2016 2316   PHURINE 5.0 03/28/2016 2316   GLUCOSEU NEGATIVE 03/28/2016 2316   HGBUR LARGE (A) 03/28/2016 2316   BILIRUBINUR NEGATIVE 03/28/2016 2316   KETONESUR 5 (A) 03/28/2016 2316   PROTEINUR NEGATIVE 03/28/2016 2316   UROBILINOGEN 0.2 11/16/2013 1045   NITRITE NEGATIVE 03/28/2016 2316   LEUKOCYTESUR NEGATIVE 03/28/2016 2316   Sepsis Labs Invalid input(s): PROCALCITONIN,  WBC,  LACTICIDVEN Microbiology No results found for this or any previous visit (from the past 240 hour(s)).   Time coordinating discharge: Over 30 minutes  SIGNED:   [employer]  Bonita QuinA Cristalle Rohm, MD  [facility] 02/14/2017, 9:38 AM Pager   If 7PM-7AM, please contact night-coverage [email or web address] Password [email or web address]

## 2017-02-22 ENCOUNTER — Other Ambulatory Visit: Payer: Self-pay | Admitting: Cardiovascular Disease

## 2017-03-02 ENCOUNTER — Other Ambulatory Visit: Payer: Self-pay | Admitting: *Deleted

## 2017-03-02 MED ORDER — LOSARTAN POTASSIUM 50 MG PO TABS: 50.0000 mg | ORAL_TABLET | Freq: Every day | ORAL | 0 refills | Status: DC

## 2017-03-21 ENCOUNTER — Other Ambulatory Visit: Payer: Self-pay | Admitting: Cardiovascular Disease

## 2017-05-08 IMAGING — NM NM GI BLOOD LOSS
2 series · 12 of 12 positions shown · non-contrast
Comparison: None

CLINICAL DATA: Acute lower GI bleed, bright red blood and clots per
rectum

EXAM:
NUCLEAR MEDICINE GASTROINTESTINAL BLEEDING SCAN
TECHNIQUE: Sequential abdominal images were obtained following intravenous
administration of 3c-55m labeled red blood cells.
RADIOPHARMACEUTICALS:  27.4 mCi 3c-55m in-vitro labeled autologous
red cells.

[gi gi bleed · 5.01mm/px · 6 of 15 frames shown (1 of 2)]
[frame 2/15]
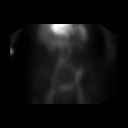
[frame 4/15]
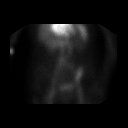
[frame 7/15]
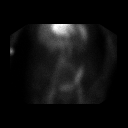
[frame 9/15]
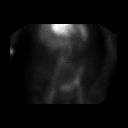
[frame 12/15]
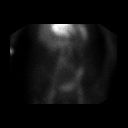
[frame 14/15]
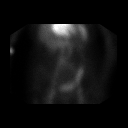

[gi gi bleed · 5.01mm/px · 6 of 60 frames shown (2 of 2)]
[frame 6/60]
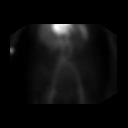
[frame 16/60]
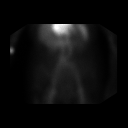
[frame 26/60]
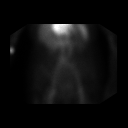
[frame 36/60]
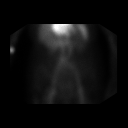
[frame 46/60]
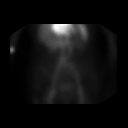
[frame 56/60]
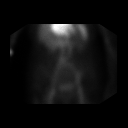

[12 of 12 positions shown; findings below may reference images not displayed]

FINDINGS: Initial normal blood pool distribution of labeled red cells.

Excretion of a small amount of delabeled tracer into urinary bladder
over the course of the exam.

Beginning at 57 minutes, abnormal intestinal localization of tracer
is identified at the sigmoid colon compatible with acute GI bleed.
IMPRESSION: Positive GI blood loss exam with site of active bleeding identified
at the sigmoid colon.

## 2017-05-15 ENCOUNTER — Encounter (INDEPENDENT_AMBULATORY_CARE_PROVIDER_SITE_OTHER): Payer: Self-pay

## 2017-05-15 ENCOUNTER — Ambulatory Visit: Payer: Federal, State, Local not specified - PPO | Admitting: Cardiovascular Disease

## 2017-05-15 ENCOUNTER — Encounter: Payer: Self-pay | Admitting: Cardiovascular Disease

## 2017-05-15 VITALS — BP 152/72 | HR 62 | Ht 62.0 in | Wt 115.1 lb

## 2017-05-15 DIAGNOSIS — I482 Chronic atrial fibrillation: Secondary | ICD-10-CM

## 2017-05-15 DIAGNOSIS — I4821 Permanent atrial fibrillation: Secondary | ICD-10-CM

## 2017-05-15 MED ORDER — LOSARTAN POTASSIUM 50 MG PO TABS: 50.0000 mg | ORAL_TABLET | Freq: Every day | ORAL | 3 refills | Status: AC

## 2017-05-15 MED ORDER — METOPROLOL TARTRATE 50 MG PO TABS: 50.0000 mg | ORAL_TABLET | Freq: Two times a day (BID) | ORAL | 3 refills | Status: AC

## 2017-05-15 MED ORDER — DILTIAZEM HCL ER COATED BEADS 180 MG PO CP24: 180.0000 mg | ORAL_CAPSULE | Freq: Every day | ORAL | 3 refills | Status: AC

## 2017-05-15 NOTE — Addendum Note (Signed)
Addended by: Levi AlandSWINYER, Moiz Ryant M on: 05/15/2017 03:04 PM   Modules accepted: Orders

## 2017-05-15 NOTE — Progress Notes (Signed)
Cardiology Office Note   Date:  05/15/2017   ID:  [name], DOB 21-Apr-1914, MRN [id number]  PCP:  Marva Panda, NP  Cardiologist:   Kristeen Miss, MD   Chief Complaint  Patient presents with  . Hypertension     1. Atrial Fibrillation  2. Hypertension  3. Mitral Valve Prolapse  4. H/o Rectal Cancer  5. Osteoporosis  6. Right Femoral neck fracture 2010  7. Left Wrist fracture   She's done very well since she left the hospital. She was admitted in April with episodes of orthostatic hypotension. We made some medication adjustments including stopping her HCTZ and we decreased her Toprol dose. We have had to cut her blood pressure medicines such that she has l high readings especially in the morning. This is because her drops her blood pressure dropped so quickly when she stands up.   She continues to be unsteady when she walks. She occasionally will walk with a cane or walker. We have encouraged her to use her cane and walker every time she goes out to walk.  May 25, 2012:  [name] is doing well from a cardiac standpoint. She has had lots of UTIs but these are better.   She has significant orthostasis. Her BP is usually pretty good in the mornings - 140-150.   April 18, 2013:  [name] was admitted to the hospital recently because of syncope.  Sept. 9, 2015:  [name] is doing well. No further episodes of syncope. Fairly steady with her walker.  No CP, breathing is ok.  She has had a-fib. Takes Cartia which helped.  Dec 07, 2013  [name] was recently seen in the hospital for a TIA. She presented with weakness on her left side. She has a history of atrial fibrillation but is not on any coagulation because of her history of falling.  She is almost back to normal. Still has some foot weakness.  No speech issues. Does have some short term memory issues according to daughter.    Feb. 9, 2016:  [name] is seen today for follow up visit.  Has  vertigo symptoms - especially when she turns her head to the left.   Has moved in with daughter.   09/29/2014:   Aug. 31, 2017: Doing well No CP or dyspnea Some stomache issues  Nov. 20, 2017:  [name] is seen today for evaluation of her HTN She's had episodes of hypertension for years but more recently has been having orthostatic hypotension.  Daughter has kept BP log  Has had a headache on occasion.   Jan. 22, 2018:  [name] is seen today for follow-up of her hypertension and history of atrial fibrillation. Still has some dizzy spells. Larey Seat and now has a right black eye. No CP or dyspnea.   May 15, 2017: [name] is 84 yo now Was in the hospital with diverticulitis  Walks some , No CP or dyspnea  Sees Wray Kearns , NP for primary care .    Past Medical History:  Diagnosis Date  . Anxiety    "now & then; I get over it"; denies RX  . Arthritis   . Atrial fibrillation (HCC)    08/2005, normal EF at that time  . Blood transfusion    "my own blood"  . Diverticulitis 2018  . Dizziness   . Fracture of femoral neck, right (HCC) 2010  . Headache(784.0)    "terrible while going thru menopause; none since then"  . History of  rectal cancer   . Hypertension   . MVP (mitral valve prolapse)    Not mentioned on 08/2005 echo however  . OP (osteoporosis)   . Pneumonia 05/2013   "once"  . Syncope 06/10/11   "fell; I've been having these spells; off & on; last time was maybe 2 yr ago"  . Tachyarrhythmia 06/11/11   burst of ST  . Wrist fracture    left    Past Surgical History:  Procedure Laterality Date  . BREAST CYST EXCISION     right  . CATARACT EXTRACTION W/ INTRAOCULAR LENS  IMPLANT, BILATERAL    . COLECTOMY    . COLON SURGERY     parts of colon removed  . COLONOSCOPY    . INGUINAL HERNIA REPAIR     right  . TOTAL HIP ARTHROPLASTY     bilaterally  . TRANSTHORACIC ECHOCARDIOGRAM  08/20/2005   EF 60%     Current Outpatient Medications  Medication Sig Dispense  Refill  . acetaminophen (TYLENOL) 500 MG tablet Take 500-1,000 mg by mouth as needed for moderate pain (pain).     . CARTIA XT 180 MG 24 hr capsule TAKE 1 CAPSULE(180 MG) BY MOUTH DAILY 90 capsule 0  . ferrous sulfate 325 (65 FE) MG tablet Take 1 tablet (325 mg total) by mouth daily with breakfast. 30 tablet 3  . fluticasone (FLONASE) 50 MCG/ACT nasal spray Place 1 spray into both nostrils as needed.     . furosemide (LASIX) 20 MG tablet Take 20 mg by mouth as needed for fluid or edema.    . lactose free nutrition (BOOST PLUS) LIQD Take 237 mLs by mouth daily at 2 PM daily at 2 PM.  0  . losartan (COZAAR) 50 MG tablet Take 1 tablet (50 mg total) by mouth daily. 90 tablet 0  . memantine (NAMENDA) 5 MG tablet Take 1 tablet (5 mg total) by mouth 2 (two) times daily. 180 tablet 1  . metoprolol tartrate (LOPRESSOR) 50 MG tablet TAKE 1 TABLET(50 MG) BY MOUTH TWICE DAILY 180 tablet 0  . Multiple Vitamins-Minerals (MULTIVITAMIN GUMMIES WOMENS) CHEW Chew 1 each by mouth daily.     . Naphazoline HCl (CLEAR EYES OP) Place 1 drop into both eyes at bedtime as needed (dry eyes/ itching).    . pantoprazole (PROTONIX) 40 MG tablet Take 1 tablet (40 mg total) by mouth daily. 30 tablet 0  . Pumpkin Seed-Soy Germ (AZO BLADDER CONTROL/GO-LESS PO) Take 1 capsule by mouth at bedtime.     No current facility-administered medications for this visit.     Allergies:   Sulfa drugs cross reactors and Irbesartan    Social History:  The patient  reports that she has never smoked. She has never used smokeless tobacco. She reports that she does not drink alcohol or use drugs.   Family History:  The patient's family history is not on file.    ROS:  Please see the history of present illness.     Physical Exam: Blood pressure (!) 152/72, pulse 62, height 5\' 2"  (1.575 m), weight 115 lb 1.9 oz (52.2 kg), SpO2 97 %.  GEN:  Well nourished, well developed in no acute distress HEENT: Normal NECK: No JVD; No carotid  bruits LYMPHATICS: No lymphadenopathy CARDIAC:  Irreg. Irreg.   , soft systolic murmur   RESPIRATORY:  Clear to auscultation without rales, wheezing or rhonchi  ABDOMEN: Soft, non-tender, non-distended MUSCULOSKELETAL:  No edema; No deformity  SKIN: Warm and dry NEUROLOGIC:  Demented.       EKG:     Recent Labs: 02/11/2017: ALT 13 02/13/2017: BUN 11; Creatinine, Ser 0.50; Platelets 147; Potassium 3.6; Sodium 143 02/14/2017: Hemoglobin 10.9    Lipid Panel    Component Value Date/Time   CHOL 126 11/17/2013 0329   TRIG 81 11/17/2013 0329   HDL 56 11/17/2013 0329   CHOLHDL 2.3 11/17/2013 0329   VLDL 16 11/17/2013 0329   LDLCALC 54 11/17/2013 0329      Wt Readings from Last 3 Encounters:  05/15/17 115 lb 1.9 oz (52.2 kg)  02/11/17 117 lb (53.1 kg)  01/06/17 117 lb (53.1 kg)      Other studies Reviewed: Additional studies/ records that were reviewed today include: . Review of the above records demonstrates:    ASSESSMENT AND PLAN:  1.  HTN:   BP is basically well controlled.   A  Bit elevated but good for her    2. Atrial Fibrillation - chronic.   Not on anticoagulation  Due to risk of falls  .  4. Mitral Valve Prolapse  5. H/o Rectal Cancer  5. Osteoporosis  7. Right Femoral neck fracture 2010      Current medicines are reviewed at length with the patient today.  The patient does not have concerns regarding medicines.  The following changes have been made:  no change   Disposition:        Signed, Kristeen Miss, MD  05/15/2017 1:54 PM    [facility] [address] [place], [place], [place]  [address] Phone: [phone]; Fax: [phone]

## 2017-05-15 NOTE — Patient Instructions (Signed)

## 2017-07-09 ENCOUNTER — Ambulatory Visit: Payer: Federal, State, Local not specified - PPO | Admitting: Adult Health

## 2017-07-09 ENCOUNTER — Encounter: Payer: Self-pay | Admitting: Adult Health

## 2017-07-09 VITALS — BP 140/48 | HR 67 | Ht 62.0 in | Wt 116.6 lb

## 2017-07-09 DIAGNOSIS — R413 Other amnesia: Secondary | ICD-10-CM | POA: Diagnosis not present

## 2017-07-09 MED ORDER — MEMANTINE HCL 5 MG PO TABS: 5.0000 mg | ORAL_TABLET | Freq: Three times a day (TID) | ORAL | 3 refills | Status: DC

## 2017-07-09 NOTE — Patient Instructions (Signed)
Your Plan:  Continue nameda Memory score is stable If your symptoms worsen or you develop new symptoms please let us know.   Thank you for coming to see [place] at [facility]. I hope we have been able to provide you high quality care today.  You may receive a patient satisfaction survey over the next few weeks. We would appreciate your feedback and comments so that we may continue to improve ourselves and the health of our patients.

## 2017-07-09 NOTE — Progress Notes (Signed)
PATIENT: [name] DOB: Aug 05, 1914  REASON FOR VISIT: follow up HISTORY FROM: patient  HISTORY OF PRESENT ILLNESS: Today 07/09/17: [name] is a 84 year old female with a history of memory disturbance.  She returns today for follow-up.  She lives at home with her daughter.  She requires minimal with ADLs.  Her daughter reports that she is still able to put her own laundry away.  She is on Namenda.  She was unable to tolerate 10 mg twice a day.  She was reduced back on the 5 mg twice a day.  Her daughter reports that the patient was more agitated and was having hallucinations.  She increased her dose to 5 mg 3 times a day and this seems to help her symptoms.  She returns today for an evaluation.  HISTORY [name] is a 84 year old right-handed white female with a history of a progressive memory problem over the last 5 years.  The memory has gotten particularly bad since 2015, the patient has a history of cerebrovascular disease and she had left-sided weakness in 2015.  MRI of the brain done at that time shows extensive white matter changes.  The patient has atrial fibrillation, she has orthostatic hypotension and she has had syncopal events because of this.  The patient has had some difficulty with agitation at times over the last year.  She is living with her daughter at this time.  She needs assistance with bathing, the patient can still dress herself, she needs supervision most of the day, she has some troubles with urinary incontinence at times.  The patient walks with a walker, she has not had any recent falls.  She does have some problems with hearing and she has macular degeneration with visual impairment.  The patient may have some agitation about once or twice a month.  She comes to this office for an evaluation.   REVIEW OF SYSTEMS: Out of a complete 14 system review of symptoms, the patient complains only of the following symptoms, and all other reviewed systems are  negative.  See HPI  ALLERGIES: Allergies  Allergen Reactions  . Sulfa Drugs Cross Reactors Hives and Other (See Comments)    Almost passed out  . Irbesartan Other (See Comments)    Caused heart to race    HOME MEDICATIONS: Outpatient Medications Prior to Visit  Medication Sig Dispense Refill  . acetaminophen (TYLENOL) 500 MG tablet Take 500-1,000 mg by mouth as needed for moderate pain (pain).     Marland Kitchen diltiazem (CARTIA XT) 180 MG 24 hr capsule Take 1 capsule (180 mg total) by mouth daily. 90 capsule 3  . ferrous sulfate 325 (65 FE) MG tablet Take 1 tablet (325 mg total) by mouth daily with breakfast. 30 tablet 3  . fluticasone (FLONASE) 50 MCG/ACT nasal spray Place 1 spray into both nostrils as needed.     . furosemide (LASIX) 20 MG tablet Take 20 mg by mouth as needed for fluid or edema.    . lactose free nutrition (BOOST PLUS) LIQD Take 237 mLs by mouth daily at 2 PM daily at 2 PM.  0  . losartan (COZAAR) 50 MG tablet Take 1 tablet (50 mg total) by mouth daily. 90 tablet 3  . memantine (NAMENDA) 5 MG tablet Take 1 tablet (5 mg total) by mouth 2 (two) times daily. 180 tablet 1  . metoprolol tartrate (LOPRESSOR) 50 MG tablet Take 1 tablet (50 mg total) by mouth 2 (two) times daily. 180 tablet 3  .  Multiple Vitamins-Minerals (MULTIVITAMIN GUMMIES WOMENS) CHEW Chew 1 each by mouth daily.     . Naphazoline HCl (CLEAR EYES OP) Place 1 drop into both eyes at bedtime as needed (dry eyes/ itching).    . pantoprazole (PROTONIX) 40 MG tablet Take 1 tablet (40 mg total) by mouth daily. 30 tablet 0  . Pumpkin Seed-Soy Germ (AZO BLADDER CONTROL/GO-LESS PO) Take 1 capsule by mouth at bedtime.     No facility-administered medications prior to visit.     PAST MEDICAL HISTORY: Past Medical History:  Diagnosis Date  . Anxiety    "now & then; I get over it"; denies RX  . Arthritis   . Atrial fibrillation (HCC)    08/2005, normal EF at that time  . Blood transfusion    "my own blood"  .  Diverticulitis 2018  . Dizziness   . Fracture of femoral neck, right (HCC) 2010  . Headache(784.0)    "terrible while going thru menopause; none since then"  . History of rectal cancer   . Hypertension   . MVP (mitral valve prolapse)    Not mentioned on 08/2005 echo however  . OP (osteoporosis)   . Pneumonia 05/2013   "once"  . Syncope 06/10/11   "fell; I've been having these spells; off & on; last time was maybe 2 yr ago"  . Tachyarrhythmia 06/11/11   burst of ST  . Wrist fracture    left    PAST SURGICAL HISTORY: Past Surgical History:  Procedure Laterality Date  . BREAST CYST EXCISION     right  . CATARACT EXTRACTION W/ INTRAOCULAR LENS  IMPLANT, BILATERAL    . COLECTOMY    . COLON SURGERY     parts of colon removed  . COLONOSCOPY    . INGUINAL HERNIA REPAIR     right  . TOTAL HIP ARTHROPLASTY     bilaterally  . TRANSTHORACIC ECHOCARDIOGRAM  08/20/2005   EF 60%    FAMILY HISTORY: Family History  Problem Relation Age of Onset  . Heart disease Neg Hx     SOCIAL HISTORY: Social History   Socioeconomic History  . Marital status: Widowed    Spouse name: Not on file  . Number of children: Not on file  . Years of education: Not on file  . Highest education level: Not on file  Occupational History  . Not on file  Social Needs  . Financial resource strain: Not on file  . Food insecurity:    Worry: Not on file    Inability: Not on file  . Transportation needs:    Medical: Not on file    Non-medical: Not on file  Tobacco Use  . Smoking status: Never Smoker  . Smokeless tobacco: Never Used  Substance and Sexual Activity  . Alcohol use: No  . Drug use: No  . Sexual activity: Never  Lifestyle  . Physical activity:    Days per week: Not on file    Minutes per session: Not on file  . Stress: Not on file  Relationships  . Social connections:    Talks on phone: Not on file    Gets together: Not on file    Attends religious service: Not on file    Active  member of club or organization: Not on file    Attends meetings of clubs or organizations: Not on file    Relationship status: Not on file  . Intimate partner violence:    Fear of current or ex  partner: Not on file    Emotionally abused: Not on file    Physically abused: Not on file    Forced sexual activity: Not on file  Other Topics Concern  . Not on file  Social History Narrative   Lives    Caffeine use: none   Right handed    Originally from [place]. Loves to garden.      PHYSICAL EXAM  Vitals:   07/09/17 1509  BP: (!) 140/48  Pulse: 67  Weight: 116 lb 9.6 oz (52.9 kg)  Height:  (1.575 m)   Body mass index is 21.33 kg/m.  MMSE - Mini Mental State Exam 07/09/2017 01/06/2017  Orientation to time 3 0  Orientation to Place 0 0  Registration 3 1  Attention/ Calculation 0 2  Recall 1 0  Language- name 2 objects 2 2  Language- repeat 0 1  Language- follow 3 step command 2 3  Language- read & follow direction 0 0  Write a sentence 0 0  Copy design 0 0  Total score 11 9     Generalized: Well developed, in no acute distress   Neurological examination  Mentation: Alert. Follows all commands speech and language fluent Cranial nerve II-XII: Pupils were equal round reactive to light. Extraocular movements were full, visual field were full on confrontational test. Facial sensation and strength were normal. Uvula tongue midline. Head turning and shoulder shrug  were normal and symmetric. Motor: The motor testing reveals 5 over 5 strength of all 4 extremities. Good symmetric motor tone is noted throughout.  Sensory: Sensory testing is intact to soft touch on all 4 extremities. No evidence of extinction is noted.  Coordination: Cerebellar testing reveals good finger-nose-finger and heel-to-shin bilaterally.  Gait and station: Patient is in a wheelchair Reflexes: Deep tendon reflexes are symmetric and normal bilaterally.   DIAGNOSTIC DATA (LABS, IMAGING,  TESTING) - I reviewed patient records, labs, notes, testing and imaging myself where available.  Lab Results  Component Value Date   WBC 8.5 02/13/2017   HGB 10.9 (L) 02/14/2017   HCT 34.3 (L) 02/14/2017   MCV 90.4 02/13/2017   PLT 147 (L) 02/13/2017      Component Value Date/Time   NA 143 02/13/2017 0437   K 3.6 02/13/2017 0437   CL 112 (H) 02/13/2017 0437   CO2 25 02/13/2017 0437   GLUCOSE 97 02/13/2017 0437   BUN 11 02/13/2017 0437   CREATININE 0.50 02/13/2017 0437   CREATININE 0.66 01/21/2016 1323   CALCIUM 8.1 (L) 02/13/2017 0437   PROT 6.3 (L) 02/11/2017 2154   ALBUMIN 3.3 (L) 02/11/2017 2154   AST 21 02/11/2017 2154   ALT 13 (L) 02/11/2017 2154   ALKPHOS 70 02/11/2017 2154   BILITOT 0.6 02/11/2017 2154   GFRNONAA >60 02/13/2017 0437   GFRAA >60 02/13/2017 0437   Lab Results  Component Value Date   CHOL 126 11/17/2013   HDL 56 11/17/2013   LDLCALC 54 11/17/2013   TRIG 81 11/17/2013   CHOLHDL 2.3 11/17/2013   Lab Results  Component Value Date   HGBA1C 6.3 (H) 11/16/2013   Lab Results  Component Value Date   VITAMINB12 1,053 01/06/2017   Lab Results  Component Value Date   TSH 1.470 04/05/2013      ASSESSMENT AND PLAN 84 y.o. year old female  has a past medical history of Anxiety, Arthritis, Atrial fibrillation (HCC), Blood transfusion, Diverticulitis (2018), Dizziness, Fracture of femoral neck, right (HCC) (2010), Headache(784.0), History  of rectal cancer, Hypertension, MVP (mitral valve prolapse), OP (osteoporosis), Pneumonia (05/2013), Syncope (06/10/11), Tachyarrhythmia (06/11/11), and Wrist fracture. here with:  1.  Memory disturbance  The patient's memory score has remained stable.  She will continue on Namenda taking 5 mg 3 times a day.  Advised that if her symptoms worsen or she develops new symptoms she should let us know.  She will follow-up in 6 months or sooner if needed.  I spent 15 minutes with the patient. 50% of this time was spent  discussing her memory:   Butch Penny, MSN, NP-C 07/09/2017, 3:07 PM [facility] [address], [address] [place], [place] [address] [phone]

## 2017-07-10 ENCOUNTER — Encounter: Payer: Self-pay | Admitting: Adult Health

## 2017-07-10 NOTE — Progress Notes (Signed)
I have read the note, and I agree with the clinical assessment and plan.  Charles K Willis   

## 2017-08-24 ENCOUNTER — Telehealth: Payer: Self-pay | Admitting: Adult Health

## 2017-08-24 MED ORDER — MEMANTINE HCL 5 MG PO TABS: ORAL_TABLET | ORAL | 3 refills | Status: DC

## 2017-08-24 NOTE — Addendum Note (Signed)
Addended by: Lindell SparKIRKMAN, MICHELLE C on: 08/24/2017 02:42 PM   Modules accepted: Orders

## 2017-08-24 NOTE — Telephone Encounter (Signed)
Pt daughter on DPR([name],[name] [phone]) has called to discuss her going from 3 a day to 4 a day on memantine (NAMENDA) 5 MG tablet due to behavior in patient.  Daughter Amber EngDavies,[name] states pt bangs on the table and is easily agitated.  Please call

## 2017-08-24 NOTE — Telephone Encounter (Signed)
Returned call to patient's daughter, Camelia EngRoberta Davies.  States her mother's increased agitation just started a few days ago but has been constant (examples: banging walker on the floor, banging her hands against the table, answering questions sternly).  She feels memantine works well for her behavior.  She is currently taking memantine 5mg , TID.  Per vo by Aundra MilletMegan, okay to increase the dose to memantine 5mg , two tablets twice daily.  The patient's daughter would like to spread the four doses out over the course of the day and Aundra MilletMegan stated this would be okay.  Her daughter is already attempting to verbally re-direct the patient to improve the symptoms. She was instructed to call back with any further concerns.

## 2017-12-15 ENCOUNTER — Encounter

## 2018-01-21 ENCOUNTER — Encounter: Payer: Self-pay | Admitting: Adult Health

## 2018-01-21 ENCOUNTER — Telehealth: Payer: Self-pay | Admitting: Adult Health

## 2018-01-21 ENCOUNTER — Ambulatory Visit (INDEPENDENT_AMBULATORY_CARE_PROVIDER_SITE_OTHER): Payer: Federal, State, Local not specified - PPO | Admitting: Adult Health

## 2018-01-21 VITALS — BP 161/77 | HR 70 | Ht 62.0 in | Wt 116.4 lb

## 2018-01-21 DIAGNOSIS — R413 Other amnesia: Secondary | ICD-10-CM | POA: Diagnosis not present

## 2018-01-21 MED ORDER — MEMANTINE HCL 5 MG PO TABS: ORAL_TABLET | ORAL | 3 refills | Status: AC

## 2018-01-21 NOTE — Patient Instructions (Addendum)
Your Plan:  Continue Namenda 5 mg fourtimes a day May consider Depakote in the future for the mood If your symptoms worsen or you develop new symptoms please let us know.    Thank you for coming to see [place] at [facility]. I hope we have been able to provide you high quality care today.  You may receive a patient satisfaction survey over the next few weeks. We would appreciate your feedback and comments so that we may continue to improve ourselves and the health of our patients.  Valproic Acid, Divalproex Sodium delayed or extended-release tablets What is this medicine? DIVALPROEX SODIUM (dye VAL pro ex SO dee um) is used to prevent seizures caused by some forms of epilepsy. It is also used to treat bipolar mania and to prevent migraine headaches. This medicine may be used for other purposes; ask your health care provider or pharmacist if you have questions. COMMON BRAND NAME(S): Depakote, Depakote ER What should I tell my health care provider before I take this medicine? They need to know if you have any of these conditions: -if you often drink alcohol -kidney disease -liver disease -low platelet counts -mitochondrial disease -suicidal thoughts, plans, or attempt; a previous suicide attempt by you or a family member -urea cycle disorder (UCD) -an unusual or allergic reaction to divalproex sodium, sodium valproate, valproic acid, other medicines, foods, dyes, or preservatives -pregnant or trying to get pregnant -breast-feeding How should I use this medicine? Take this medicine by mouth with a drink of water. Follow the directions on the prescription label. Do not cut, crush or chew this medicine. You can take it with or without food. If it upsets your stomach, take it with food. Take your medicine at regular intervals. Do not take it more often than directed. Do not stop taking except on your doctor's advice. A special MedGuide will be given to you by the pharmacist  with each prescription and refill. Be sure to read this information carefully each time. Talk to your pediatrician regarding the use of this medicine in children. While this drug may be prescribed for children as young as 10 years for selected conditions, precautions do apply. Overdosage: If you think you have taken too much of this medicine contact a poison control center or emergency room at once. NOTE: This medicine is only for you. Do not share this medicine with others. What if I miss a dose? If you miss a dose, take it as soon as you can. If it is almost time for your next dose, take only that dose. Do not take double or extra doses. What may interact with this medicine? Do not take this medicine with any of the following medications: -sodium phenylbutyrate This medicine may also interact with the following medications: -aspirin -certain antibiotics like ertapenem, imipenem, meropenem -certain medicines for depression, anxiety, or psychotic disturbances -certain medicines for seizures like carbamazepine, clonazepam, diazepam, ethosuximide, felbamate, lamotrigine, phenobarbital, phenytoin, primidone, rufinamide, topiramate -certain medicines that treat or prevent blood clots like warfarin -cholestyramine -female hormones, like estrogens and birth control pills, patches, or rings -propofol -rifampin -ritonavir -tolbutamide -zidovudine This list may not describe all possible interactions. Give your health care provider a list of all the medicines, herbs, non-prescription drugs, or dietary supplements you use. Also tell them if you smoke, drink alcohol, or use illegal drugs. Some items may interact with your medicine. What should I watch for while using this medicine? Tell your doctor or healthcare professional if your symptoms do not  get better or they start to get worse. Wear a medical ID bracelet or chain, and carry a card that describes your disease and details of your medicine and  dosage times. You may get drowsy, dizzy, or have blurred vision. Do not drive, use machinery, or do anything that needs mental alertness until you know how this medicine affects you. To reduce dizzy or fainting spells, do not sit or stand up quickly, especially if you are an older patient. Alcohol can increase drowsiness and dizziness. Avoid alcoholic drinks. This medicine can make you more sensitive to the sun. Keep out of the sun. If you cannot avoid being in the sun, wear protective clothing and use sunscreen. Do not use sun lamps or tanning beds/booths. Patients and their families should watch out for new or worsening depression or thoughts of suicide. Also watch out for sudden changes in feelings such as feeling anxious, agitated, panicky, irritable, hostile, aggressive, impulsive, severely restless, overly excited and hyperactive, or not being able to sleep. If this happens, especially at the beginning of treatment or after a change in dose, call your health care professional. Women should inform their doctor if they wish to become pregnant or think they might be pregnant. There is a potential for serious side effects to an unborn child. Talk to your health care professional or pharmacist for more information. Women who become pregnant while using this medicine may enroll in the [place] American Antiepileptic Drug Pregnancy Registry by calling [phone]. This registry collects information about the safety of antiepileptic drug use during pregnancy. What side effects may I notice from receiving this medicine? Side effects that you should report to your doctor or health care professional as soon as possible: -allergic reactions like skin rash, itching or hives, swelling of the face, lips, or tongue -changes in vision -redness, blistering, peeling or loosening of the skin, including inside the mouth -signs and symptoms of liver injury like dark yellow or brown urine; general ill feeling or flu-like  symptoms; light-colored stools; loss of appetite; nausea; right upper belly pain; unusually weak or tired; yellowing of the eyes or skin -suicidal thoughts or other mood changes -unusual bleeding or bruising Side effects that usually do not require medical attention (report to your doctor or health care professional if they continue or are bothersome): -constipation -diarrhea -dizziness -hair loss -headache -loss of appetite -weight gain This list may not describe all possible side effects. Call your doctor for medical advice about side effects. You may report side effects to FDA at 1-800-FDA-1088. Where should I keep my medicine? Keep out of reach of children. Store at room temperature between 15 and 30 degrees C (59 and 86 degrees F). Keep container tightly closed. Throw away any unused medicine after the expiration date. NOTE: This sheet is a summary. It may not cover all possible information. If you have questions about this medicine, talk to your doctor, pharmacist, or health care provider.  2018 Elsevier/Gold Standard (2015-05-03 07:11:40)

## 2018-01-21 NOTE — Progress Notes (Signed)
PATIENT: [name] DOB: 12-Mar-1914  REASON FOR VISIT: follow up HISTORY FROM: patient  HISTORY OF PRESENT ILLNESS: Today 01/21/18:  [name] is a 84 year old female with a history of memory disturbance.  She returns today for follow-up.  She continues to live at home with her daughter.  The daughter states that she is more forgetful.  Her mood and behavior has improved with the increase in Namenda however she still gets agitated easily.  She requires assistance with ADLs.  Daughter reports good appetite.  Reports that she does get up several times at night to urinate.  She states that with her agitation she tries to redirect her and sometimes it is beneficial and other times it is not.  She is the primary caregiver.  She returns today for evaluation.  HISTORY 07/09/17: [name] is a 84 year old female with a history of memory disturbance.  She returns today for follow-up.  She lives at home with her daughter.  She requires minimal with ADLs.  Her daughter reports that she is still able to put her own laundry away.  She is on Namenda.  She was unable to tolerate 10 mg twice a day.  She was reduced back on the 5 mg twice a day.  Her daughter reports that the patient was more agitated and was having hallucinations.  She increased her dose to 5 mg 3 times a day and this seems to help her symptoms.  She returns today for an evaluation.   REVIEW OF SYSTEMS: Out of a complete 14 system review of symptoms, the patient complains only of the following symptoms, and all other reviewed systems are negative.  Hearing loss, frequency of urination, memory loss, behavior problem, agitation, walking difficulty  ALLERGIES: Allergies  Allergen Reactions  . Sulfa Drugs Cross Reactors Hives and Other (See Comments)    Almost passed out  . Irbesartan Other (See Comments)    Caused heart to race    HOME MEDICATIONS: Outpatient Medications Prior to Visit  Medication Sig Dispense Refill  .  acetaminophen (TYLENOL) 500 MG tablet Take 500-1,000 mg by mouth as needed for moderate pain (pain).     Marland Kitchen diltiazem (CARTIA XT) 180 MG 24 hr capsule Take 1 capsule (180 mg total) by mouth daily. 90 capsule 3  . ferrous sulfate 325 (65 FE) MG tablet Take 1 tablet (325 mg total) by mouth daily with breakfast. 30 tablet 3  . fluticasone (FLONASE) 50 MCG/ACT nasal spray Place 1 spray into both nostrils as needed.     . furosemide (LASIX) 20 MG tablet Take 20 mg by mouth as needed for fluid or edema.    . lactose free nutrition (BOOST PLUS) LIQD Take 237 mLs by mouth daily at 2 PM daily at 2 PM.  0  . losartan (COZAAR) 50 MG tablet Take 1 tablet (50 mg total) by mouth daily. 90 tablet 3  . memantine (NAMENDA) 5 MG tablet Take one tablet four times daily. 120 tablet 3  . metoprolol tartrate (LOPRESSOR) 50 MG tablet Take 1 tablet (50 mg total) by mouth 2 (two) times daily. 180 tablet 3  . Multiple Vitamins-Minerals (MULTIVITAMIN GUMMIES WOMENS) CHEW Chew 1 each by mouth daily.     . Naphazoline HCl (CLEAR EYES OP) Place 1 drop into both eyes at bedtime as needed (dry eyes/ itching).    . pantoprazole (PROTONIX) 40 MG tablet Take 1 tablet (40 mg total) by mouth daily. 30 tablet 0  . Pumpkin Seed-Soy Germ (  AZO BLADDER CONTROL/GO-LESS PO) Take 1 capsule by mouth at bedtime.     No facility-administered medications prior to visit.     PAST MEDICAL HISTORY: Past Medical History:  Diagnosis Date  . Anxiety    "now & then; I get over it"; denies RX  . Arthritis   . Atrial fibrillation (HCC)    08/2005, normal EF at that time  . Blood transfusion    "my own blood"  . Diverticulitis 2018  . Dizziness   . Fracture of femoral neck, right (HCC) 2010  . Headache(784.0)    "terrible while going thru menopause; none since then"  . History of rectal cancer   . Hypertension   . MVP (mitral valve prolapse)    Not mentioned on 08/2005 echo however  . OP (osteoporosis)   . Pneumonia 05/2013   "once"  .  Syncope 06/10/11   "fell; I've been having these spells; off & on; last time was maybe 2 yr ago"  . Tachyarrhythmia 06/11/11   burst of ST  . Wrist fracture    left    PAST SURGICAL HISTORY: Past Surgical History:  Procedure Laterality Date  . BREAST CYST EXCISION     right  . CATARACT EXTRACTION W/ INTRAOCULAR LENS  IMPLANT, BILATERAL    . COLECTOMY    . COLON SURGERY     parts of colon removed  . COLONOSCOPY    . INGUINAL HERNIA REPAIR     right  . TOTAL HIP ARTHROPLASTY     bilaterally  . TRANSTHORACIC ECHOCARDIOGRAM  08/20/2005   EF 60%    FAMILY HISTORY: Family History  Problem Relation Age of Onset  . Stroke Daughter   . Heart disease Neg Hx     SOCIAL HISTORY: Social History   Socioeconomic History  . Marital status: Widowed    Spouse name: Not on file  . Number of children: Not on file  . Years of education: Not on file  . Highest education level: Not on file  Occupational History  . Not on file  Social Needs  . Financial resource strain: Not on file  . Food insecurity:    Worry: Not on file    Inability: Not on file  . Transportation needs:    Medical: Not on file    Non-medical: Not on file  Tobacco Use  . Smoking status: Never Smoker  . Smokeless tobacco: Never Used  Substance and Sexual Activity  . Alcohol use: No  . Drug use: No  . Sexual activity: Never  Lifestyle  . Physical activity:    Days per week: Not on file    Minutes per session: Not on file  . Stress: Not on file  Relationships  . Social connections:    Talks on phone: Not on file    Gets together: Not on file    Attends religious service: Not on file    Active member of club or organization: Not on file    Attends meetings of clubs or organizations: Not on file    Relationship status: Not on file  . Intimate partner violence:    Fear of current or ex partner: Not on file    Emotionally abused: Not on file    Physically abused: Not on file    Forced sexual activity: Not  on file  Other Topics Concern  . Not on file  Social History Narrative   Lives    Caffeine use: none   Right handed  Originally from [place]. Loves to garden.      PHYSICAL EXAM  Vitals:   01/21/18 1339  BP: (!) 161/77  Pulse: 70  Weight: 116 lb 6.4 oz (52.8 kg)  Height: 5\' 2"  (1.575 m)   Body mass index is 21.29 kg/m.  Generalized: Well developed, in no acute distress   Neurological examination  Mentation: Alert . Follows all commands speech and language fluent Cranial nerve II-XII: Pupils were equal round reactive to light. Extraocular movements were full, visual field were full on confrontational test. Facial sensation and strength were normal. Uvula tongue midline. Head turning and shoulder shrug  were normal and symmetric. Motor: The motor testing reveals 5 over 5 strength of all 4 extremities. Good symmetric motor tone is noted throughout.  Sensory: Sensory testing is intact to soft touch on all 4 extremities. No evidence of extinction is noted.  Coordination: Cerebellar testing reveals good finger-nose-finger and heel-to-shin bilaterally.  Gait and station: Patient is in a wheelchair. Reflexes: Deep tendon reflexes are symmetric and normal bilaterally.   DIAGNOSTIC DATA (LABS, IMAGING, TESTING) - I reviewed patient records, labs, notes, testing and imaging myself where available.  Lab Results  Component Value Date   WBC 8.5 02/13/2017   HGB 10.9 (L) 02/14/2017   HCT 34.3 (L) 02/14/2017   MCV 90.4 02/13/2017   PLT 147 (L) 02/13/2017      Component Value Date/Time   NA 143 02/13/2017 0437   K 3.6 02/13/2017 0437   CL 112 (H) 02/13/2017 0437   CO2 25 02/13/2017 0437   GLUCOSE 97 02/13/2017 0437   BUN 11 02/13/2017 0437   CREATININE 0.50 02/13/2017 0437   CREATININE 0.66 01/21/2016 1323   CALCIUM 8.1 (L) 02/13/2017 0437   PROT 6.3 (L) 02/11/2017 2154   ALBUMIN 3.3 (L) 02/11/2017 2154   AST 21 02/11/2017 2154   ALT 13 (L) 02/11/2017 2154    ALKPHOS 70 02/11/2017 2154   BILITOT 0.6 02/11/2017 2154   GFRNONAA >60 02/13/2017 0437   GFRAA >60 02/13/2017 0437   Lab Results  Component Value Date   CHOL 126 11/17/2013   HDL 56 11/17/2013   LDLCALC 54 11/17/2013   TRIG 81 11/17/2013   CHOLHDL 2.3 11/17/2013   Lab Results  Component Value Date   HGBA1C 6.3 (H) 11/16/2013   Lab Results  Component Value Date   VITAMINB12 1,053 01/06/2017   Lab Results  Component Value Date   TSH 1.470 04/05/2013      ASSESSMENT AND PLAN 84 y.o. year old female  has a past medical history of Anxiety, Arthritis, Atrial fibrillation (HCC), Blood transfusion, Diverticulitis (2018), Dizziness, Fracture of femoral neck, right (HCC) (2010), Headache(784.0), History of rectal cancer, Hypertension, MVP (mitral valve prolapse), OP (osteoporosis), Pneumonia (05/2013), Syncope (06/10/11), Tachyarrhythmia (06/11/11), and Wrist fracture. here with:  1.  Memory disturbance  The patient will continue on Namenda 10 mg twice a day.  I have advised that if she continues to have trouble with agitation and aggressiveness we can consider adding on Depakote.  For now the daughter will continue to try nonpharmacological ways to manage her agitation.  Advised that if her symptoms worsen or she develops new symptoms they should let us know.  She will follow-up in 6 months or sooner if needed.  I spent 15 minutes with the patient. 50% of this time was spent reviewing Depakote   Butch Penny, MSN, NP-C 01/21/2018, 1:56 PM [facility] [address] [place], [place] [address] [phone]  [phone]   

## 2018-01-21 NOTE — Telephone Encounter (Signed)
Namenda refilled as per previous refill.

## 2018-01-21 NOTE — Telephone Encounter (Signed)
Pt daughter [name] requesting refills for memantine (NAMENDA) 5 MG [facility]

## 2018-03-10 ENCOUNTER — Telehealth: Payer: Self-pay | Admitting: Adult Health

## 2018-03-10 NOTE — Telephone Encounter (Signed)
Pts daughter states she needs a letter for her mother regarding her Competency. Please advise.

## 2018-03-10 NOTE — Telephone Encounter (Signed)
Called daughter and asked for additional information. She stated she brought a letter to the office this morning to be filled out. I advised will look for it. She verbalized understanding, appreciation.

## 2018-03-17 ENCOUNTER — Encounter: Payer: Self-pay | Admitting: *Deleted

## 2018-03-17 NOTE — Telephone Encounter (Signed)
Letter done per request.  Called daughter, Camelia Eng to let her know ready for pick up. Will pick up tomorrow, will be in check in file.

## 2018-03-24 IMAGING — CT CT ABD-PELV W/ CM
2 of 5 series · 16 of 46 positions shown, 18 images · IV contrast (APPLIED)
Comparison: None.

CLINICAL DATA: Rectal bleeding. History of rectal cancer,
diverticulitis, blood transfusions, inguinal hernia repair, and
colectomy.

EXAM:
CT ABDOMEN AND PELVIS WITH CONTRAST
TECHNIQUE: Multidetector CT imaging of the abdomen and pelvis was performed
using the standard protocol following bolus administration of
intravenous contrast.
CONTRAST:  100mL YF31B9-ILL IOPAMIDOL (YF31B9-ILL) INJECTION 61%

[Series 3: abdomen 5.0 · axial · 0.71mm/px · z∈[+650,+984]mm · 13 of 77 slices shown, 15 images]
[im 5/77  soft-tissue]
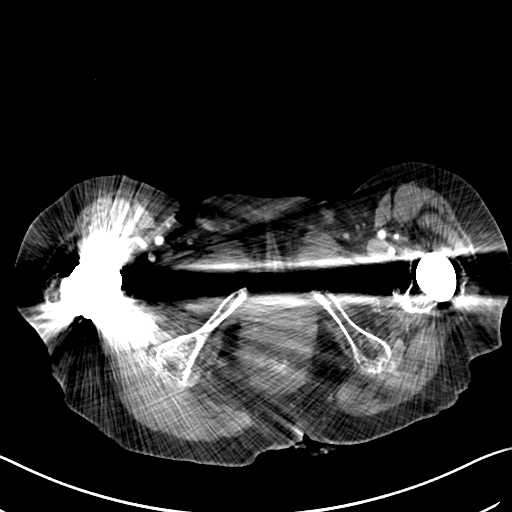
[im 5/77  bone]
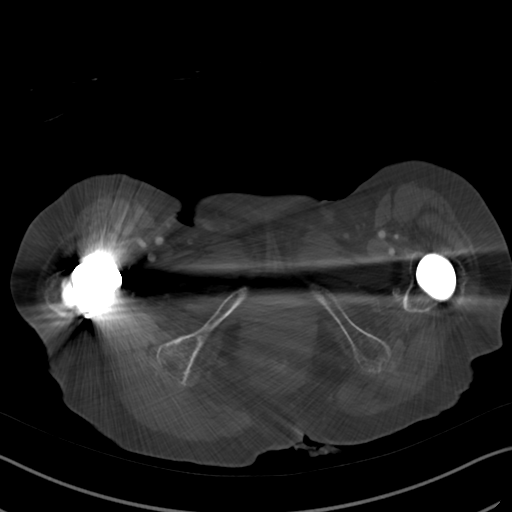
[im 10/77  soft-tissue]
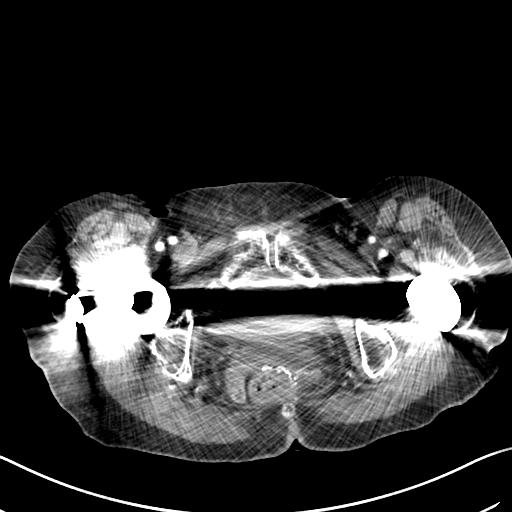
[im 20/77  soft-tissue]
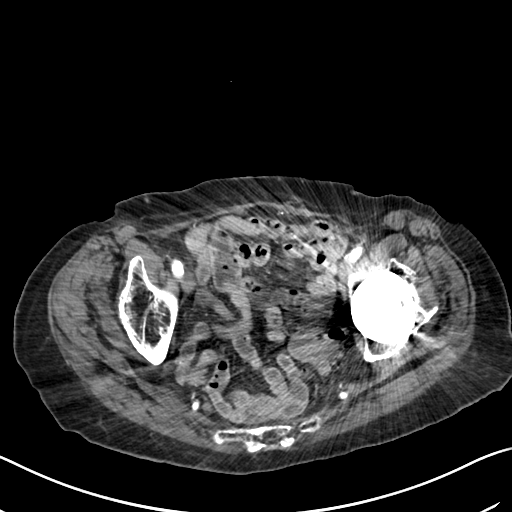
[im 24/77  soft-tissue]
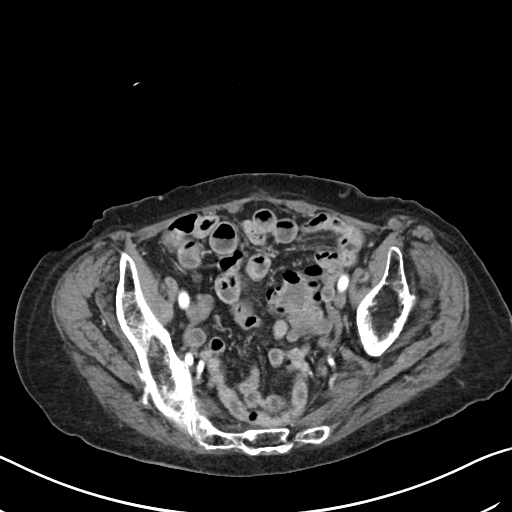
[im 29/77  soft-tissue]
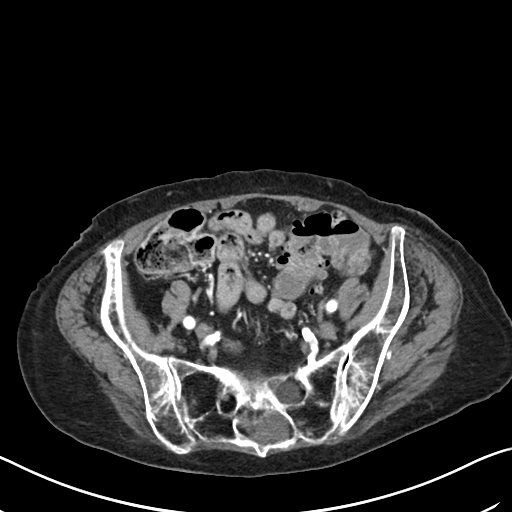
[im 34/77  soft-tissue]
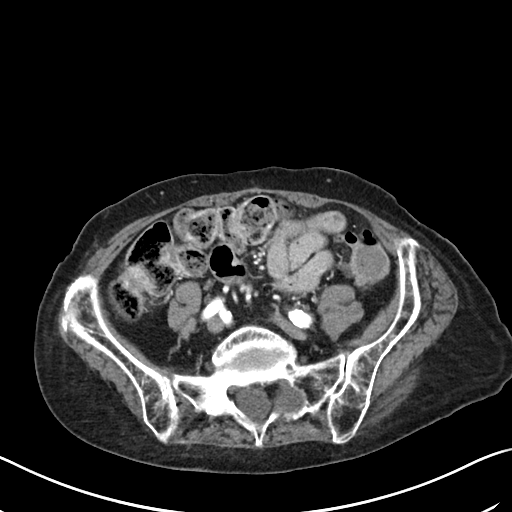
[im 43/77  soft-tissue]
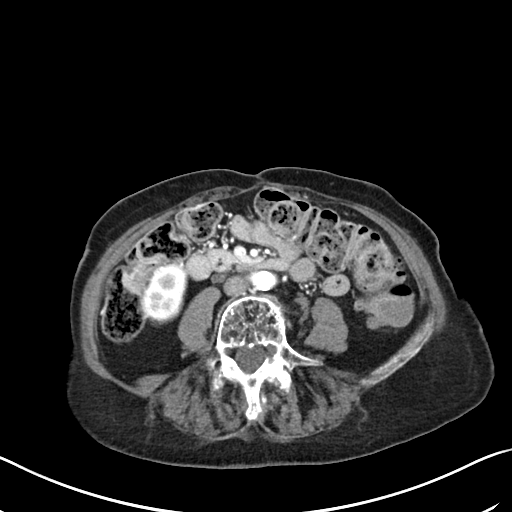
[im 48/77  soft-tissue]
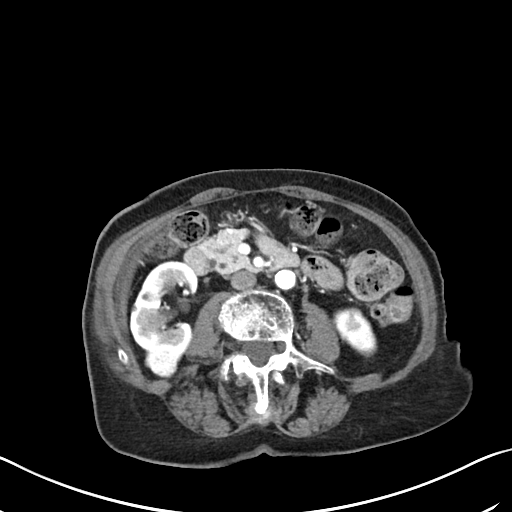
[im 53/77  soft-tissue]
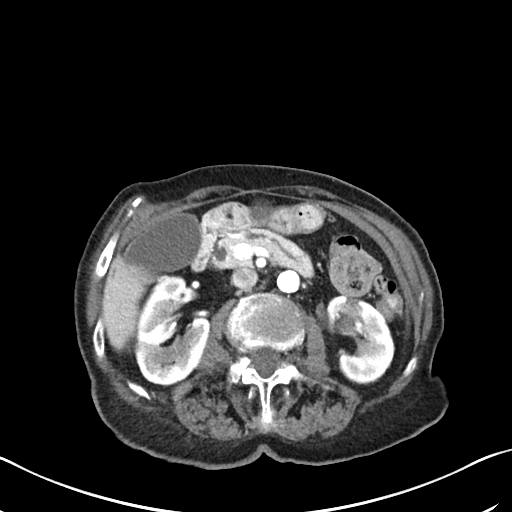
[im 53/77  bone]
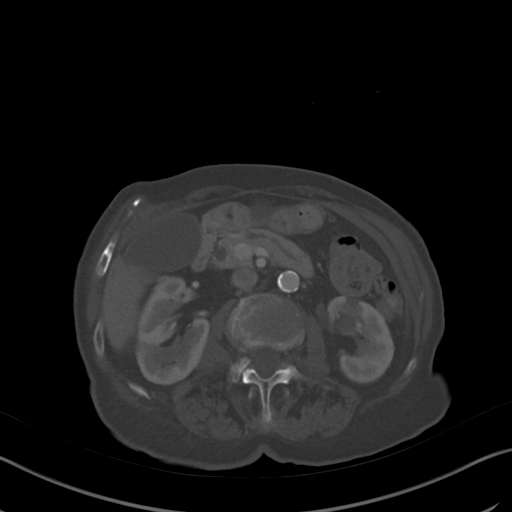
[im 58/77  soft-tissue]
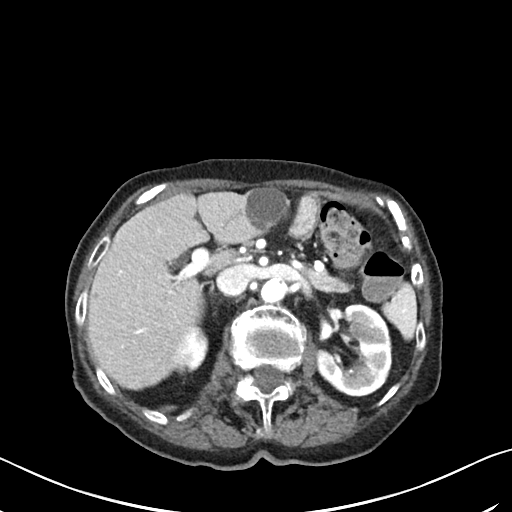
[im 62/77  soft-tissue]
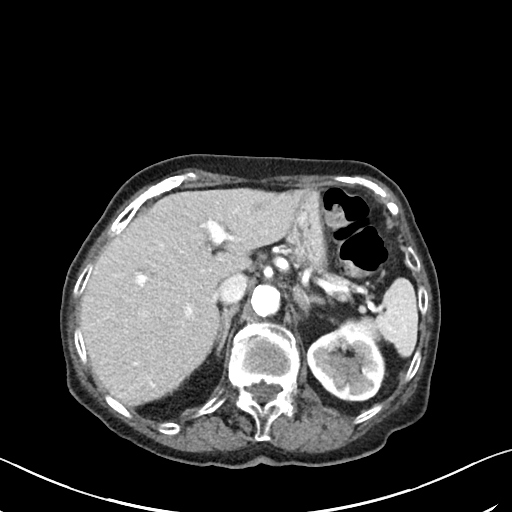
[im 67/77  soft-tissue]
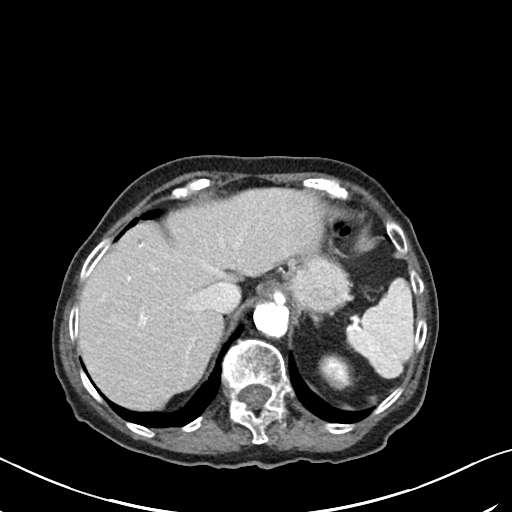
[im 72/77  soft-tissue]
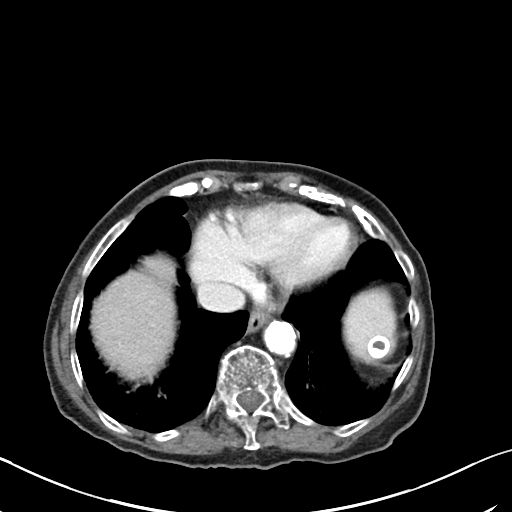

[Series 6: abdomen 3.0 mpr cor · coronal · 0.75mm/px · 3 of 74 slices shown]
[im 25/74  soft-tissue]
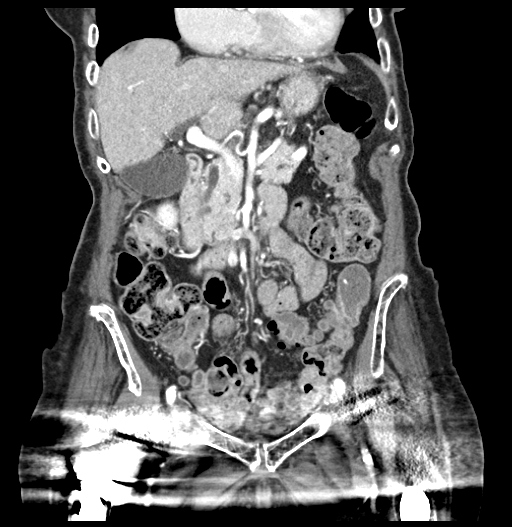
[im 33/74  soft-tissue]
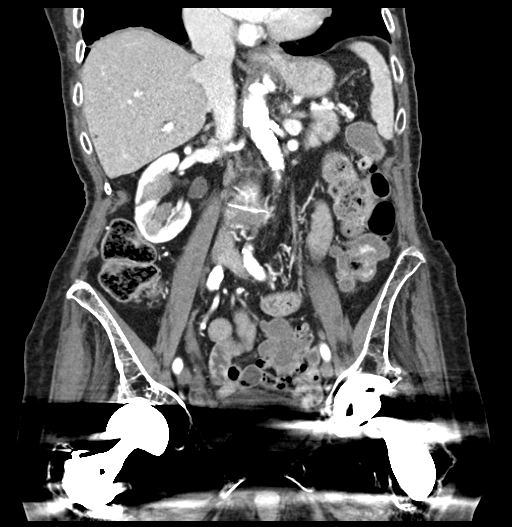
[im 41/74  soft-tissue]
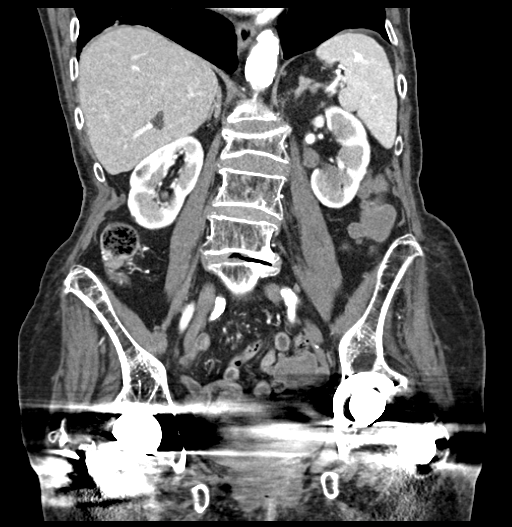

[16 of 46 positions shown; findings below may reference images not displayed]

FINDINGS: Lower chest: Dependent atelectasis and fibrosis in the lung bases.
Probable emphysematous changes.

Hepatobiliary: Multiple hepatic cysts, largest in the lateral
segment left lobe of the liver measuring 2.7 cm. These have a benign
appearance. Gallbladder and bile ducts are unremarkable.

Pancreas: Mild pancreatic ductal dilatation. Cause is not
determined. Consider MRCP or ERCP in the elective setting to exclude
obstructing lesion. No infiltrative changes.

Spleen: 15 mm rounded calcification in the upper spleen likely
representing postinflammatory process.

Adrenals/Urinary Tract: No adrenal gland nodules. Bilateral renal
cysts. Solid enhancing mass in the posterior midpole of the right
kidney measuring 2.6 cm diameter. This likely represents a renal
cell neoplasm. No renal vein thrombosis. No hydronephrosis or
hydroureter. Bladder is not visualized due to streak artifact from
bilateral hip arthroplasties.

Stomach/Bowel: Stomach and small bowel are mostly decompressed.
Anastomosis in the low rectal region. Limited visualization of the
sigmoid colon due to streak artifact from the arthroplasties.
Sigmoid diverticulosis is present. No definite evidence of
inflammatory change or obstruction. Appendix is normal.

Vascular/Lymphatic: Aortic atherosclerosis. No enlarged abdominal or
pelvic lymph nodes.

Reproductive: Pelvis and reproductive organs are obscured by streak
artifact from the hip arthroplasties.

Other: No free air or free fluid in the abdomen. Postoperative
changes in the anterior abdominal wall likely representing hernia
closure.

Musculoskeletal: Degenerative changes throughout lumbar spine.
Compression of L1 and L2 vertebrae, probably chronic. Mild anterior
subluxation of L4 on L5 is likely degenerative. Degenerative disc
disease at L4-5. Large Tarlov cysts in the sacrum. Bilateral total
hip arthroplasties.
IMPRESSION: 1. Solid enhancing 2.6 cm lesion in the right kidney likely
representing a renal cell carcinoma. Known metastases demonstrated.
2. Nonspecific pancreatic duct dilatation. Consider further
evaluation with elective MRCP or ERCP to exclude an obstructing
lesion.
3. Atelectasis, fibrosis, and emphysematous changes in the lung
bases.
4. Benign-appearing hepatic and renal cysts.
5. Aortic atherosclerosis.
6. Diverticulosis of the sigmoid colon without evidence of
diverticulitis. Portions of the low pelvis and sigmoid colon are
obscured due to streak artifact from bilateral hip arthroplasties.
7. Multiple lumbar vertebral compression fractures likely
representing osteoporosis. Tarlov cysts in the sacrum.

## 2018-03-25 ENCOUNTER — Telehealth: Payer: Self-pay | Admitting: Cardiovascular Disease

## 2018-03-25 NOTE — Telephone Encounter (Signed)
New Message:     Misty from [facility] is calling from [place]. Pt will be moving there on next week. She needs a detailed Medicine List signed and dated please.Marland Kitchen Please fax to [phone] Att: Misty.

## 2018-03-26 ENCOUNTER — Encounter: Payer: Self-pay | Admitting: Nurse Practitioner

## 2018-03-26 NOTE — Telephone Encounter (Signed)
Spoke with Misty at [facility] and advised that I can send a list of cardiac medications signed by Dr. Elease Hashimoto next week when he returns to the office. I advised that the list will be only of the medications prescribed by Dr. Elease Hashimoto. She verbalized understanding and thanked me for our help.

## 2018-03-30 ENCOUNTER — Ambulatory Visit: Payer: Federal, State, Local not specified - PPO | Admitting: Cardiovascular Disease

## 2018-03-30 ENCOUNTER — Encounter: Payer: Self-pay | Admitting: Cardiovascular Disease

## 2018-03-30 ENCOUNTER — Encounter: Payer: Self-pay | Admitting: Nurse Practitioner

## 2018-03-30 VITALS — BP 130/76 | HR 71 | Ht 62.0 in | Wt 115.8 lb

## 2018-03-30 DIAGNOSIS — I4821 Permanent atrial fibrillation: Secondary | ICD-10-CM

## 2018-03-30 NOTE — Patient Instructions (Signed)
Medication Instructions:  Your physician has recommended you make the following change in your medication:  STOP Lasix (Furosemide)  If you need a refill on your cardiac medications before your next appointment, please call your pharmacy.   Lab work: None Ordered   Testing/Procedures: None Ordered    Follow-Up: At [facility], you and your health needs are our priority.  As part of our continuing mission to provide you with exceptional heart care, we have created designated Provider Care Teams.  These Care Teams include your primary Cardiologist (physician) and Advanced Practice Providers (APPs -  Physician Assistants and Nurse Practitioners) who all work together to provide you with the care you need, when you need it. You will need a follow up appointment in:   As needed. You may see Kristeen Miss, MD or one of the following Advanced Practice Providers on your designated Care Team: Tereso Newcomer, PA-C Vin Conner, [place] . Berton Bon, NP

## 2018-03-30 NOTE — Progress Notes (Signed)
Cardiology Office Note   Date:  03/30/2018   ID:  Lenice PressmanRuth T Reidinger, DOB [birth year], MRN [id number]  PCP:  Marva PandaMillsaps, Kimberly, NP  Cardiologist:   Kristeen MissPhilip Nahser, MD   Chief Complaint  Patient presents with  . Atrial Fibrillation     1. Atrial Fibrillation  2. Hypertension  3. Mitral Valve Prolapse  4. H/o Rectal Cancer  5. Osteoporosis  6. Right Femoral neck fracture 2010  7. Left Wrist fracture   She's done very well since she left the hospital. She was admitted in April with episodes of orthostatic hypotension. We made some medication adjustments including stopping her HCTZ and we decreased her Toprol dose. We have had to cut her blood pressure medicines such that she has l high readings especially in the morning. This is because her drops her blood pressure dropped so quickly when she stands up.   She continues to be unsteady when she walks. She occasionally will walk with a cane or walker. We have encouraged her to use her cane and walker every time she goes out to walk.  May 25, 2012:  [name] is doing well from a cardiac standpoint. She has had lots of UTIs but these are better.   She has significant orthostasis. Her BP is usually pretty good in the mornings - 140-150.   April 18, 2013:  [name] was admitted to the hospital recently because of syncope.  Sept. 9, 2015:  [name] is doing well. No further episodes of syncope. Fairly steady with her walker.  No CP, breathing is ok.  She has had a-fib. Takes Cartia which helped.  Dec 07, 2013  [name] was recently seen in the hospital for a TIA. She presented with weakness on her left side. She has a history of atrial fibrillation but is not on any coagulation because of her history of falling.  She is almost back to normal. Still has some foot weakness.  No speech issues. Does have some short term memory issues according to daughter.    Feb. 9, 2016:  [name] is seen today for follow up  visit.  Has vertigo symptoms - especially when she turns her head to the left.   Has moved in with daughter.   09/29/2014:   Aug. 31, 2017: Doing well No CP or dyspnea Some stomache issues  Nov. 20, 2017:  [name] is seen today for evaluation of her HTN She's had episodes of hypertension for years but more recently has been having orthostatic hypotension.  Daughter has kept BP log  Has had a headache on occasion.   Jan. 22, 2018:  [name] is seen today for follow-up of her hypertension and history of atrial fibrillation. Still has some dizzy spells. Larey SeatFell and now has a right black eye. No CP or dyspnea.   May 15, 2017: [name] is [age] yo now Was in the hospital with diverticulitis  Walks some , No CP or dyspnea  Sees Wray KearnsKimberly Milsap , NP for primary care .   March 30, 2018: Seen today for follow-up visit visit.  She is now 85 years old. Seen with daughter,  Raberta.   [name] is moving to a SNF in [place] .    Past Medical History:  Diagnosis Date  . Anxiety    "now & then; I get over it"; denies RX  . Arthritis   . Atrial fibrillation (HCC)    08/2005, normal EF at that time  . Blood transfusion    "my own blood"  .  Diverticulitis 2018  . Dizziness   . Fracture of femoral neck, right (HCC) 2010  . Headache(784.0)    "terrible while going thru menopause; none since then"  . History of rectal cancer   . Hypertension   . MVP (mitral valve prolapse)    Not mentioned on 08/2005 echo however  . OP (osteoporosis)   . Pneumonia 05/2013   "once"  . Syncope 06/10/11   "fell; I've been having these spells; off & on; last time was maybe 2 yr ago"  . Tachyarrhythmia 06/11/11   burst of ST  . Wrist fracture    left    Past Surgical History:  Procedure Laterality Date  . BREAST CYST EXCISION     right  . CATARACT EXTRACTION W/ INTRAOCULAR LENS  IMPLANT, BILATERAL    . COLECTOMY    . COLON SURGERY     parts of colon removed  . COLONOSCOPY    . INGUINAL HERNIA REPAIR      right  . TOTAL HIP ARTHROPLASTY     bilaterally  . TRANSTHORACIC ECHOCARDIOGRAM  08/20/2005   EF 60%     Current Outpatient Medications  Medication Sig Dispense Refill  . acetaminophen (TYLENOL) 500 MG tablet Take 500-1,000 mg by mouth as needed for moderate pain (pain).     Marland Kitchen diltiazem (CARTIA XT) 180 MG 24 hr capsule Take 1 capsule (180 mg total) by mouth daily. 90 capsule 3  . ferrous sulfate 325 (65 FE) MG tablet Take 1 tablet (325 mg total) by mouth daily with breakfast. 30 tablet 3  . fluticasone (FLONASE) 50 MCG/ACT nasal spray Place 1 spray into both nostrils as needed.     . furosemide (LASIX) 20 MG tablet Take 20 mg by mouth as needed for fluid or edema.    . lactose free nutrition (BOOST PLUS) LIQD Take 237 mLs by mouth daily at 2 PM daily at 2 PM.  0  . losartan (COZAAR) 50 MG tablet Take 1 tablet (50 mg total) by mouth daily. 90 tablet 3  . memantine (NAMENDA) 5 MG tablet Take one tablet four times daily. 120 tablet 3  . metoprolol tartrate (LOPRESSOR) 50 MG tablet Take 1 tablet (50 mg total) by mouth 2 (two) times daily. 180 tablet 3  . Multiple Vitamins-Minerals (MULTIVITAMIN GUMMIES WOMENS) CHEW Chew 1 each by mouth daily.     . Naphazoline HCl (CLEAR EYES OP) Place 1 drop into both eyes at bedtime as needed (dry eyes/ itching).    . pantoprazole (PROTONIX) 40 MG tablet Take 1 tablet (40 mg total) by mouth daily. 30 tablet 0  . Pumpkin Seed-Soy Germ (AZO BLADDER CONTROL/GO-LESS PO) Take 1 capsule by mouth at bedtime.     No current facility-administered medications for this visit.     Allergies:   Sulfa drugs cross reactors and Irbesartan    Social History:  The patient  reports that she has never smoked. She has never used smokeless tobacco. She reports that she does not drink alcohol or use drugs.   Family History:  The patient's family history includes Stroke in her daughter.    ROS:  Please see the history of present illness.    Physical Exam: Blood pressure  130/76, pulse 71, height 5\' 2"  (1.575 m), weight 115 lb 12.8 oz (52.5 kg).  GEN:   eelderly female,   NAD , examined in wheelchair HEENT: Normal NECK: No JVD; No carotid bruits LYMPHATICS: No lymphadenopathy CARDIAC: Irreg. Irreg. , soft systolic murmur  RESPIRATORY:  Clear to auscultation without rales, wheezing or rhonchi  ABDOMEN: Soft, non-tender, non-distended MUSCULOSKELETAL:  No edema; No deformity  SKIN: Warm and dry NEUROLOGIC:  Alert , not oriented.    EKG:    March 30, 2018: Atrial fibrillation with a heart rate of 71.  Nonspecific ST and T wave changes.   Recent Labs: No results found for requested labs within last 8760 hours.    Lipid Panel    Component Value Date/Time   CHOL 126 11/17/2013 0329   TRIG 81 11/17/2013 0329   HDL 56 11/17/2013 0329   CHOLHDL 2.3 11/17/2013 0329   VLDL 16 11/17/2013 0329   LDLCALC 54 11/17/2013 0329      Wt Readings from Last 3 Encounters:  03/30/18 115 lb 12.8 oz (52.5 kg)  01/21/18 116 lb 6.4 oz (52.8 kg)  07/09/17 116 lb 9.6 oz (52.9 kg)      Other studies Reviewed: Additional studies/ records that were reviewed today include: . Review of the above records demonstrates:    ASSESSMENT AND PLAN:  1.  HTN:     Blood pressure is well controlled.  Continue current medications.  New losartan 50 mg once a day.   2. Atrial Fibrillation chronic atrial fibrillation.  She is on diltiazem slow release 180 mg a day.  She is also on metoprolol 50 mg twice a day. Continue these medications.  4. Mitral Valve Prolapse stable.   5. H/o Rectal Cancer  5. Osteoporosis  7. Right Femoral neck fracture 2010      Current medicines are reviewed at length with the patient today.  The patient does not have concerns regarding medicines.  The following changes have been made:  no change   Disposition:        Signed, Kristeen Miss, MD  03/30/2018 11:02 AM    [facility] [address] [place], [place],  [place]  [address] Phone: [phone]; Fax: [phone]

## 2018-07-26 ENCOUNTER — Ambulatory Visit: Payer: Federal, State, Local not specified - PPO | Admitting: Neurology

## 2019-07-12 DEATH — deceased
